# Patient Record
Sex: Male | Born: 1955 | Race: White | Hispanic: No | Marital: Married | Smoking: Former smoker
Health system: Southern US, Community
[De-identification: ages and names within clinical notes are randomized; demographics above are authoritative.]

## PROBLEM LIST (undated history)

## (undated) DIAGNOSIS — G4733 Obstructive sleep apnea (adult) (pediatric): Secondary | ICD-10-CM

## (undated) DIAGNOSIS — I2510 Atherosclerotic heart disease of native coronary artery without angina pectoris: Secondary | ICD-10-CM

## (undated) DIAGNOSIS — D47Z2 Castleman disease: Secondary | ICD-10-CM

## (undated) DIAGNOSIS — R945 Abnormal results of liver function studies: Secondary | ICD-10-CM

## (undated) DIAGNOSIS — R600 Localized edema: Secondary | ICD-10-CM

## (undated) DIAGNOSIS — R7989 Other specified abnormal findings of blood chemistry: Secondary | ICD-10-CM

## (undated) DIAGNOSIS — I2119 ST elevation (STEMI) myocardial infarction involving other coronary artery of inferior wall: Secondary | ICD-10-CM

## (undated) DIAGNOSIS — I10 Essential (primary) hypertension: Secondary | ICD-10-CM

## (undated) HISTORY — DX: ST elevation (STEMI) myocardial infarction involving other coronary artery of inferior wall: I21.19

## (undated) HISTORY — DX: Abnormal results of liver function studies: R94.5

## (undated) HISTORY — PX: BONE CYST EXCISION: SHX376

## (undated) HISTORY — DX: Other specified abnormal findings of blood chemistry: R79.89

## (undated) HISTORY — DX: Obstructive sleep apnea (adult) (pediatric): G47.33

## (undated) HISTORY — DX: Essential (primary) hypertension: I10

## (undated) HISTORY — PX: TUMOR REMOVAL: SHX12

## (undated) HISTORY — DX: Castleman disease: D47.Z2

## (undated) HISTORY — PX: SKIN GRAFT: SHX250

## (undated) HISTORY — DX: Atherosclerotic heart disease of native coronary artery without angina pectoris: I25.10

## (undated) HISTORY — DX: Localized edema: R60.0

---

## 2008-09-03 ENCOUNTER — Encounter: Payer: Self-pay | Admitting: Cardiology

## 2009-01-11 ENCOUNTER — Encounter: Payer: Self-pay | Admitting: Cardiology

## 2009-02-10 ENCOUNTER — Encounter: Payer: Self-pay | Admitting: Cardiology

## 2009-04-02 ENCOUNTER — Ambulatory Visit: Payer: Self-pay | Admitting: Cardiology

## 2009-09-28 ENCOUNTER — Encounter: Payer: Self-pay | Admitting: Physician Assistant

## 2009-09-28 ENCOUNTER — Inpatient Hospital Stay (HOSPITAL_COMMUNITY): Admission: EM | Admit: 2009-09-28 | Discharge: 2009-10-01 | Payer: Self-pay | Admitting: Emergency Medicine

## 2009-09-28 ENCOUNTER — Ambulatory Visit: Payer: Self-pay | Admitting: Cardiovascular Disease

## 2009-09-29 ENCOUNTER — Encounter: Payer: Self-pay | Admitting: Cardiovascular Disease

## 2009-09-30 ENCOUNTER — Encounter: Payer: Self-pay | Admitting: Cardiovascular Disease

## 2009-10-01 ENCOUNTER — Encounter: Payer: Self-pay | Admitting: Cardiology

## 2009-10-01 ENCOUNTER — Encounter: Payer: Self-pay | Admitting: Cardiovascular Disease

## 2009-10-02 ENCOUNTER — Telehealth (INDEPENDENT_AMBULATORY_CARE_PROVIDER_SITE_OTHER): Payer: Self-pay | Admitting: *Deleted

## 2009-10-13 ENCOUNTER — Encounter: Payer: Self-pay | Admitting: Cardiology

## 2009-10-20 ENCOUNTER — Ambulatory Visit: Payer: Self-pay | Admitting: Cardiology

## 2009-10-20 DIAGNOSIS — K7689 Other specified diseases of liver: Secondary | ICD-10-CM

## 2009-10-20 DIAGNOSIS — R599 Enlarged lymph nodes, unspecified: Secondary | ICD-10-CM | POA: Insufficient documentation

## 2009-10-20 DIAGNOSIS — G4733 Obstructive sleep apnea (adult) (pediatric): Secondary | ICD-10-CM

## 2009-10-20 DIAGNOSIS — I2510 Atherosclerotic heart disease of native coronary artery without angina pectoris: Secondary | ICD-10-CM | POA: Insufficient documentation

## 2009-10-20 DIAGNOSIS — I219 Acute myocardial infarction, unspecified: Secondary | ICD-10-CM | POA: Insufficient documentation

## 2009-11-21 ENCOUNTER — Encounter: Payer: Self-pay | Admitting: Cardiology

## 2010-03-05 ENCOUNTER — Encounter: Payer: Self-pay | Admitting: Cardiology

## 2010-03-12 ENCOUNTER — Encounter: Payer: Self-pay | Admitting: Cardiology

## 2010-04-09 ENCOUNTER — Ambulatory Visit: Payer: Self-pay | Admitting: Cardiology

## 2010-04-09 ENCOUNTER — Encounter: Payer: Self-pay | Admitting: Physician Assistant

## 2010-04-09 ENCOUNTER — Encounter (INDEPENDENT_AMBULATORY_CARE_PROVIDER_SITE_OTHER): Payer: Self-pay | Admitting: *Deleted

## 2010-04-10 ENCOUNTER — Ambulatory Visit: Payer: Self-pay | Admitting: Cardiology

## 2010-04-10 ENCOUNTER — Inpatient Hospital Stay (HOSPITAL_BASED_OUTPATIENT_CLINIC_OR_DEPARTMENT_OTHER): Admission: RE | Admit: 2010-04-10 | Discharge: 2010-04-10 | Payer: Self-pay | Admitting: Cardiology

## 2010-04-10 ENCOUNTER — Encounter: Payer: Self-pay | Admitting: Physician Assistant

## 2010-04-14 ENCOUNTER — Telehealth (INDEPENDENT_AMBULATORY_CARE_PROVIDER_SITE_OTHER): Payer: Self-pay | Admitting: *Deleted

## 2010-04-21 ENCOUNTER — Encounter: Payer: Self-pay | Admitting: Physician Assistant

## 2010-04-22 ENCOUNTER — Encounter (INDEPENDENT_AMBULATORY_CARE_PROVIDER_SITE_OTHER): Payer: Self-pay | Admitting: *Deleted

## 2010-05-04 ENCOUNTER — Ambulatory Visit: Payer: Self-pay | Admitting: Cardiology

## 2010-05-04 DIAGNOSIS — E782 Mixed hyperlipidemia: Secondary | ICD-10-CM | POA: Insufficient documentation

## 2010-05-11 ENCOUNTER — Encounter: Payer: Self-pay | Admitting: Physician Assistant

## 2010-06-18 ENCOUNTER — Encounter (INDEPENDENT_AMBULATORY_CARE_PROVIDER_SITE_OTHER): Payer: Self-pay | Admitting: *Deleted

## 2010-06-30 ENCOUNTER — Encounter: Payer: Self-pay | Admitting: Physician Assistant

## 2010-07-02 ENCOUNTER — Encounter (INDEPENDENT_AMBULATORY_CARE_PROVIDER_SITE_OTHER): Payer: Self-pay | Admitting: *Deleted

## 2010-07-21 NOTE — Assessment & Plan Note (Signed)
Summary: 6 mo ful fholt   Visit Type:  Follow-up Primary Provider:  Dr. Sherril Cong   History of Present Illness: 55 year old male presents for follow-up. He saw Dr. Dannielle Burn in May.  Since his last visit, patient reports 3 discrete episodes of chest pain, reminiscent of his MI presentation earlier this year. His most severe episode was in late September, as he was completing his usual exercise routine. His initial symptom was that of nausea, followed by profuse diaphoresis, and then chest pain. His 2 previous episodes consisted of chest pressure only. In all 3 cases, he took one nitroglycerin tablet. During the last episode, however, he briefly lost consciousness, and his wife called EMS. He quickly regained consciousness, and was told that he had bottomed out his blood pressure. He reports that they took readings of 90 systolic, upon standing. He apparently did not feel that he needed subsequent medical attention, at that time.  Since then, he denies any recurrent episodes. He has continued to exercise regularly. He does point out, however, that this most recent episode was his most intense, and most like his initial MI presentation, back in April.  Twelve-lead EKG in office today indicates normal sinus rhythm with no acute changes.    Preventive Screening-Counseling & Management  Alcohol-Tobacco     Smoking Status: quit     Year Quit: 2008  Current Medications (verified): 1)  Fish Oil 1000 Mg Caps (Omega-3 Fatty Acids) .... Take 3 Tablet By Mouth Once A Day 2)  Toprol Xl 25 Mg Xr24h-Tab (Metoprolol Succinate) .... Take 1 Tablet By Mouth Once A Day 3)  Amlodipine Besylate 10 Mg Tabs (Amlodipine Besylate) .... Take 1 Tablet By Mouth Once A Day 4)  Furosemide 40 Mg Tabs (Furosemide) .... Take 1 Tablet By Mouth Once A Day As Needed 5)  Multivitamins  Tabs (Multiple Vitamin) .... Take 1 Tablet By Mouth Once A Day 6)  Nitrostat 0.4 Mg Subl (Nitroglycerin) .... Use As Directed 7)   Aspir-Trin 325 Mg Tbec (Aspirin) .... Take 1 Tablet By Mouth Once A Day 8)  Effient 10 Mg Tabs (Prasugrel Hcl) .... Take 1 Tablet By Mouth Once A Day 9)  Diovan Hct 320-12.5 Mg Tabs (Valsartan-Hydrochlorothiazide) .... Take 1 Tablet By Mouth Once A Day 10)  Vitamin C 500 Mg Tabs (Ascorbic Acid) .... Take 2 Tablet By Mouth Once A Day 11)  Vitamin E 400 Unit Caps (Vitamin E) .... Take 2 Tablet By Mouth Once A Day 12)  Prevacid 15 Mg Cpdr (Lansoprazole) .... As Needed 13)  Metrogel 1 % Gel (Metronidazole) .... Apply Topically Two Times A Day  Allergies (verified): 1)  ! * Niquil 2)  ! * Acetamenophin  Comments:  Nurse/Medical Assistant: The patient's medication bottles and allergies were reviewed with the patient and were updated in the Medication and Allergy Lists.  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 All other systems negative.   Vital Signs:  Patient profile:   55 year old male Height:      69 inches Weight:      189  pounds Pulse rate:   64 / minute BP sitting:   131 / 80  (left arm) Cuff size:   regular  Vitals Entered By: Georgina Peer (April 09, 2010 3:38 PM)  Physical Exam  Additional Exam:  GEN: 55 year old male, sitting upright, in no distress HEENT: NCAT,PERRLA,EOMI NECK: palpable pulses, no bruits; no JVD; no TM LUNGS: CTA bilaterally HEART: RRR (S1S2); no significant murmurs; no rubs; no gallops ABD: soft, NT; intact BS EXT: intact distal pulses; no edema SKIN: warm, dry MUSC: no obvious  deformity NEURO: A/O (x3)     EKG  Procedure date:  04/09/2010  Findings:      normal sinus rhythm at 64 bpm; normal axis; no acute changes  Impression & Recommendations:  Problem # 1:  CAD (ICD-414.00)  Patient returns to clinic for scheduled followup, reporting symptoms of chest discomfort reminiscent of his MI presentation, back in April. His most recent episode, in late September, was his most intense. In all 3 cases, he reported relief after one nitroglycerin tablet. He has not had any recurrent episodes. EKG today does not indicate any acute changes. I discussed with him the option of either a stress test versus relook cardiac catheterization, and we both agreed that a diagnostic coronary angiogram would be more definitive. I conferred with Dr. Dannielle Burn, who also agreed, and we'll arrange for an early procedure in our JV catheterization lab, perhaps as early as tomorrow. The patient has p.r.n. nitroglycerin, and has been advised to proceed directly to the ED, in the event of worsening and unrelieved chest pain. Following completion of the study, we will arrange for early post hospital followup with Dr. Dannielle Burn.  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

## 2010-07-21 NOTE — Letter (Signed)
Summary: Risk analyst at [facility]. [address]   [place], [place] [address]   Phone: [phone]  Fax: [phone]        April 22, 2010 MRN: [id number]   Dwayne Garrett [name], [name]  [address]   Dear [name],  Your test ordered by Rande Lawman has been reviewed by your physician (or physician assistant) and was found to be normal or stable. Your physician (or physician assistant) felt no changes were needed at this time.  ____ Echocardiogram  ____ Cardiac Stress Test  ____ Lab Work  ____ Peripheral vascular study of arms, legs or neck  __X__ CT scan or X-ray (chest x-ray done on 10/20)  ____ Lung or Breathing test  ____ Other:   Thank you.   Lovina Reach, LPN    Bryon Lions, M.D., F.A.C.C. Maceo Pro, M.D., F.A.C.C. Cammy Copa, M.D., F.A.C.C. Vonda Antigua, M.D., F.A.C.C. Vita Barley, M.D., F.A.C.C. Mare Ferrari, M.D., F.A.C.C. Emi Belfast, PA-C

## 2010-07-21 NOTE — Cardiovascular Report (Signed)
Summary: Cardiac Catheterization  Cardiac Catheterization   Imported By: Bartholomew Boards 04/21/2010 15:34:19  _____________________________________________________________________  External Attachment:    Type:   Image     Comment:   External Document

## 2010-07-21 NOTE — Letter (Signed)
Summary: Cardiac Cath Instructions - JV Lab  [facility] [place]. [address]   [place], [place] [address]   Phone: [phone]  Fax: [phone]     04/09/2010 MRN: [id number]  Dwayne Garrett [place], [place]  [address]  Dear Dwayne Garrett,   You are scheduled for a Cardiac Catheterization on Friday, October21 at 12:00 with Dr. Doreatha Lew.  Please arrive to the 1st floor of the Heart and Vascular Center at [facility] at 11:00 am on the day of your procedure. Please do not arrive before 6:30 a.m. Call the Heart and Vascular Center at [phone] if you are unable to make your appointmnet. The Code to get into the parking garage under the building is 0200. Take the elevators to the 1st floor. You must have someone to drive you home. Someone must be with you for the first 24 hours after you arrive home. Please wear clothes that are easy to get on and off and wear slip-on shoes. Do not eat or drink after midnight except water with your medications that morning. Bring all your medications and current insurance cards with you.  ___ DO NOT take these medications before your procedure:  ___ Make sure you take your aspirin.  ___ You may take ALL of your medications with water that morning.  ___ DO NOT take ANY medications before your procedure.  ___ Pre-med instructions:  ________________________________________________________________________  The usual length of stay after your procedure is 2 to 3 hours. This can vary.  If you have any questions, please call the office at the number listed above.  Lovina Reach, LPN    Directions to the [facility]  Please Note : Park in [place] under the building not the parking deck.  From [employer]: Turn onto [address] Left onto [address] (1st stoplight) Right at the brick entrance to the hospital ([address] circle [address]) [place] to the right and you will see a blue sign  "[facility]" Parking garage is a sharp right'to get through the gate out in the code _______. Once you park, take the elevator to the first floor. Please do not arrive before 0630am. The building will be dark before that time.   From [address] onto [address] left into the brick entrance to the hospital ([address] circle [address]) [place] to the right and you will see a blue sign "[facility]" Parking garage is a sharp right, to get thru the gate put in the code ____. Once you park, take the elevator to the first floor. Please do not arrive before 0630am. The building will be dark before that time

## 2010-07-21 NOTE — Letter (Signed)
Summary: Internal Correspondence/ FAXED [place]  Internal Correspondence/ FAXED [facility] ORDER   Imported By: Bartholomew Boards 04/23/2010 16:56:14  _____________________________________________________________________  External Attachment:    Type:   Image     Comment:   External Document

## 2010-07-21 NOTE — Assessment & Plan Note (Signed)
Summary: eph - post cath   Visit Type:  Follow-up Primary Valeriano Bain:  Dr. Sherril Cong   History of Present Illness: 55 year old male presents for followup. He is a patient of Dr. Dannielle Burn seen recently by Mr. Serpe on 20 October with recurrent chest pain, subsequently referred for cardiac catheterization, outlined below.  Coronary angiography was performed  on 04/10/10, by Dr. Bea Laura, revealing patent stents in the RCA and diagonal artery, with nonobstructive residual disease in the left main, proximal left circumflex, proximal LAD, and RCA. LV function was normal (EF 55%), with no focal wall motion abnormalities.   Clinically, patient reports no recurrent chest pain, since his procedure. He denies any complications of the right groin incision site.  Preventive Screening-Counseling & Management  Alcohol-Tobacco     Smoking Status: quit     Year Quit: 2008  Current Medications (verified): 1)  Fish Oil 1000 Mg Caps (Omega-3 Fatty Acids) .... Take 3 Tablet By Mouth Once A Day 2)  Toprol Xl 25 Mg Xr24h-Tab (Metoprolol Succinate) .... Take 1 Tablet By Mouth Once A Day 3)  Amlodipine Besylate 10 Mg Tabs (Amlodipine Besylate) .... Take 1 Tablet By Mouth Once A Day 4)  Furosemide 40 Mg Tabs (Furosemide) .... Take 1 Tablet By Mouth Once A Day As Needed 5)  Multivitamins  Tabs (Multiple Vitamin) .... Take 1 Tablet By Mouth Once A Day 6)  Nitrostat 0.4 Mg Subl (Nitroglycerin) .... Use As Directed 7)  Aspir-Trin 325 Mg Tbec (Aspirin) .... Take 1 Tablet By Mouth Once A Day 8)  Effient 10 Mg Tabs (Prasugrel Hcl) .... Take 1 Tablet By Mouth Once A Day 9)  Diovan Hct 320-12.5 Mg Tabs (Valsartan-Hydrochlorothiazide) .... Take 1 Tablet By Mouth Once A Day 10)  Vitamin C 1000 Mg Tabs (Ascorbic Acid) .... Take 1 Tablet By Mouth Once A Day 11)  Vitamin E 400 Unit Caps (Vitamin E) .... Take 2 Tablet By Mouth Once A Day 12)  Prevacid 15 Mg Cpdr (Lansoprazole) .... As Needed 13)  Metrogel 1 % Gel  (Metronidazole) .... Apply Topically Two Times A Day  Allergies (verified): 1)  ! * Niquil 2)  ! * Acetamenophin  Comments:  Nurse/Medical Assistant: The patient's medication bottles and allergies were reviewed with the patient and were updated in the Medication and Allergy Lists.  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 All other systems negative.   Vital Signs:  Patient profile:   55 year old male Height:      69 inches Weight:      192 pounds Pulse rate:   61 / minute BP sitting:   119 / 73  (left arm) Cuff size:   regular  Vitals Entered By: Georgina Peer (May 04, 2010 11:34 AM)  Physical Exam  Additional Exam:  GEN: 55 year old male, sitting upright, in no distress HEENT: NCAT,PERRLA,EOMI NECK: palpable pulses, no bruits; no JVD; no TM LUNGS: CTA bilaterally HEART: RRR (S1S2); no significant murmurs; no rubs; no gallops ABD: soft, NT; intact BS EXT: intact distal pulses; stable right groin with intact pulse, no ecchymosis  or bruit SKIN: warm, dry MUSC: no obvious deformity NEURO: A/O (x3)     Cardiac Cath  Procedure date:  04/11/2010  Findings:       CORONARY ARTERIES:   1. Left main coronary artery had a 20-30% tubular narrowing in the       midportion extending to the distal portions.   2. Left anterior descending.  The left anterior descending had a 50-       60% proximal stenosis.  At the level of the large diagonal vessel,       there is approximately 50% stenosis prior to the diagonal and 50-        60% stenosis focally right after the large diagonal.  There was a       stent in the large diagonal vessel that was patent.  There are       irregularities in this diagonal as well.  The left anterior       descending itself crossed the apex and had irregularities.   3. The left circumflex had a 30-40% proximal narrowing.  It continues       to posterior lateral branch with minimal irregularities,       essentially was normal.   4. The right coronary artery is a large dominant vessel with multiple       scattered irregularities.  There was a large stent in the proximal       portion that was patent without any in-stent narrowing.  There were       5 branches to the inferior wall off the right coronary artery.       There were scattered multiple irregularities, but no focal       narrowing.  Impression & Recommendations:  Problem # 1:  CAD (ICD-414.00)  stable anatomy by recent coronary angiography, indicating patent RCA and diagonal stent sites, with otherwise nonobstructive residual disease, and normal LV function. Continue medical therapy, albeit with reduced aspirin dose of 81 mg daily, to be taken indefinitely.  Problem # 2:  MIXED HYPERLIPIDEMIA (ICD-272.2)  we'll reassess lipid status with a FLP/LFT profile. Patient is followed by Dr. Laural Golden, for history of elevated LFTs and biopsy-proven NASH. A recent LFT profile in September was notable for total bilirubin 1.7 and indirect bilirubin 1.6. Dr. Laural Golden cleared the patient to be placed on a low dose statin, but with close monitoring of his LFTs.  Problem # 3:  CASTLEMAN'S DISEASE (XIH-038.6) Assessment: Comment Only  Problem # 4:  FATTY LIVER DISEASE (ICD-571.8) Assessment: Comment Only  followed by Dr. Laural Golden  Other Orders: T-Hepatic Function [phone]) T-Lipid Profile [phone])  Patient Instructions: 1)  Your physician wants you to follow-up in: 6 months. You will receive a reminder letter in the mail one-two  months in advance. If you don't receive a letter, please call our office to schedule the follow-up appointment. 2)  Decrease Aspirin to 163m (1/2 of your 3219mtablets) and then 8144mnce daily. 3)  [facility] and liver function labs:  Do not eat or drink after midnight.

## 2010-07-21 NOTE — Letter (Signed)
Summary: External Correspondence/ OFFICE VISIT [facility]. [facility]  External Correspondence/ OFFICE VISIT DR. Laural Golden   Imported By: Bartholomew Boards 03/09/2010 14:46:20  _____________________________________________________________________  External Attachment:    Type:   Image     Comment:   External Document

## 2010-07-21 NOTE — Assessment & Plan Note (Signed)
Summary: EPH-POST CATH FROM [facility] vs   Visit Type:  Follow-up Primary Provider:  Dr. Sherril Cong   History of Present Illness: 55 year old male presents to the office for the first time after a recent hospitalization at [facility]. the patient is status post recent myocardial infarction. He required 2 drug-eluting stents to the right coronary artery. He has some residual coronary artery disease but his ejection fraction is 60-65%. The patient has not been placed on staff and drug therapy due to the significant history of fatty liver disease. He has been started on prasugel in conjunction with aspirin. He's tolerating this well without any bleeding complications.  The patient denies intercurrent chest pain short of breath orthopnea or PND. He reports no complications related to his recent catheterization.  The patient fortunately was given an error in his prescription and did not receive any refills on his thyopyridone.   Preventive Screening-Counseling & Management  Alcohol-Tobacco     Smoking Status: quit      Drug Use:  no.    Current Medications (verified): 1)  Fish Oil 1000 Mg Caps (Omega-3 Fatty Acids) .... Take 3 Tablet By Mouth Once A Day 2)  Toprol Xl 25 Mg Xr24h-Tab (Metoprolol Succinate) .... Take 1 Tablet By Mouth Once A Day 3)  Amlodipine Besylate 10 Mg Tabs (Amlodipine Besylate) .... Take 1 Tablet By Mouth Once A Day 4)  Furosemide 40 Mg Tabs (Furosemide) .... Take 1 Tablet By Mouth Once A Day As Needed 5)  Multivitamins  Tabs (Multiple Vitamin) .... Take 1 Tablet By Mouth Once A Day 6)  Nitrostat 0.4 Mg Subl (Nitroglycerin) .... Use As Directed 7)  Aspir-Trin 325 Mg Tbec (Aspirin) .... Take 1 Tablet By Mouth Once A Day 8)  Effient 10 Mg Tabs (Prasugrel Hcl) .... Take 1 Tablet By Mouth Once A Day 9)  Diovan Hct 320-12.5 Mg Tabs (Valsartan-Hydrochlorothiazide) .... Take 1 Tablet By Mouth Once A Day 10)  Vitamin C 500 Mg Tabs (Ascorbic Acid) .... Take 2 Tablet By Mouth  Once A Day 11)  Vitamin E 400 Unit Caps (Vitamin E) .... Take 2 Tablet By Mouth Once A Day 12)  Prevacid 15 Mg Cpdr (Lansoprazole) .... As Needed  Allergies (verified): 1)  ! * Niquil 2)  ! * Acetamenophin  Comments:  Nurse/Medical Assistant: The patient's medications and allergies were reviewed with the patient and were updated in the Medication and Allergy Lists. Bottles reviewed.  Clinical Review Panels:  Cardiac Imaging Cardiac Cath Findings ANGIOGRAPHIC FINDINGS:   1. The left main coronary artery had a long diffuse 50% stenosis in       the midportion of the vessel.   2. The circumflex artery gave off a moderate-sized obtuse marginal       branch.  There appeared to be a 60% stenosis in the proximal       portion of the circumflex artery.   3. Left anterior descending was a large vessel that coursed to the       apex and gave off a large caliber diagonal branch.  The LAD had a       50% stenosis in the proximal portion of the vessel.  There was a       40% stenosis in the midportion of the vessel.  There was distal       plaque disease.  The diagonal was a large-caliber vessel that had       an 80% tubular stenosis in the proximal  portion of the vessel.   4. The right coronary artery had 100% total occlusion in the proximal       portion.   5. Left ventricular angiogram was performed in the RAO projection that       showed subtle hypokinesis of the inferior wall with ejection       fraction of 65%. (09/29/2009)    Past History:  Family History: Last updated: 11/07/09 Siblings: brother died age 36 with MI  Social History: Last updated: 2009/11/07 Full Time - machinist Married  Tobacco Use - Former Alcohol Use - no Drug Use - no  Past Medical History: CAD - multivessel (moderate), DES RCA 4/11, LVEF 60-65% Hypertension Myocardial Infarction - IMI 4/11 OSA on CPAP Abnormal LFT's Castleman's disease  Past Surgical History: Cyst removal from neck Skin  graft to finger Abdominal tumor removal  Family History: Siblings: brother died age 13 with MI  Social History: Full Time - Furniture conservator/restorer Married  Tobacco Use - Former Alcohol Use - no Drug Use - no Smoking Status:  quit Drug Use:  no  Review of Systems  The patient denies fatigue, malaise, fever, weight gain/loss, vision loss, decreased hearing, hoarseness, chest pain, palpitations, shortness of breath, prolonged cough, wheezing, sleep apnea, coughing up blood, abdominal pain, blood in stool, nausea, vomiting, diarrhea, heartburn, incontinence, blood in urine, muscle weakness, joint pain, leg swelling, rash, skin lesions, headache, fainting, dizziness, depression, anxiety, enlarged lymph nodes, easy bruising or bleeding, and environmental allergies.    Vital Signs:  Patient profile:   55 year old male Height:      69 inches Weight:      215 pounds BMI:     31.86 Pulse rate:   60 / minute BP sitting:   121 / 81  (left arm) Cuff size:   large  Vitals Entered By: Georgina Peer Nov 07, 2009 10:05 AM)  Nutrition Counseling: Patient's BMI is greater than 25 and therefore counseled on weight management options.  Physical Exam  Additional Exam:  General: Well-developed, well-nourished in no distress head: Normocephalic and atraumatic eyes PERRLA/EOMI intact, conjunctiva and lids normal nose: No deformity or lesions mouth normal dentition, normal posterior pharynx neck: Supple, no JVD.  No masses, thyromegaly or abnormal cervical nodes lungs: Normal breath sounds bilaterally without wheezing.  Normal percussion heart: regular rate and rhythm with normal S1 and S2, no S3 or S4.  PMI is normal.  No pathological murmurs abdomen: Normal bowel sounds, abdomen is soft and nontender without masses, organomegaly or hernias noted.  No hepatosplenomegaly musculoskeletal: Back normal, normal gait muscle strength and tone normal pulsus: Pulse is normal in all 4 extremities Extremities: No  peripheral pitting edema neurologic: Alert and oriented x 3 skin: Intact without lesions or rashes cervical nodes: No significant adenopathy psychologic: Normal affect    Cardiac Cath  Procedure date:  09/29/2009  Findings:      ANGIOGRAPHIC FINDINGS:   1. The left main coronary artery had a long diffuse 50% stenosis in       the midportion of the vessel.   2. The circumflex artery gave off a moderate-sized obtuse marginal       branch.  There appeared to be a 60% stenosis in the proximal       portion of the circumflex artery.   3. Left anterior descending was a large vessel that coursed to the       apex and gave off a large caliber diagonal branch.  The LAD  had a       50% stenosis in the proximal portion of the vessel.  There was a       40% stenosis in the midportion of the vessel.  There was distal       plaque disease.  The diagonal was a large-caliber vessel that had       an 80% tubular stenosis in the proximal portion of the vessel.   4. The right coronary artery had 100% total occlusion in the proximal       portion.   5. Left ventricular angiogram was performed in the RAO projection that       showed subtle hypokinesis of the inferior wall with ejection       fraction of 65%.  Echocardiogram  Procedure date:  09/29/2009  Findings:       Study Conclusions    - Left ventricle: The cavity size was normal. Wall thickness was     increased in a pattern of mild LVH. Systolic function was normal.     The estimated ejection fraction was in the range of 60% to 65%.   - Mitral valve: Mild regurgitation.  Impression & Recommendations:  Problem # 1:  CAD (ICD-414.00) the patient has residual coronary disease but no recurrent chest pain. Continue risk factor modification His updated medication list for this problem includes:    Toprol Xl 25 Mg Xr24h-tab (Metoprolol succinate) .Marland Kitchen... Take 1 tablet by mouth once a day    Amlodipine Besylate 10 Mg Tabs (Amlodipine  besylate) .Marland Kitchen... Take 1 tablet by mouth once a day    Nitrostat 0.4 Mg Subl (Nitroglycerin) ..... Use as directed    Aspir-trin 325 Mg Tbec (Aspirin) .Marland Kitchen... Take 1 tablet by mouth once a day    Effient 10 Mg Tabs (Prasugrel hcl) .Marland Kitchen... Take 1 tablet by mouth once a day  Problem # 2:  MYOCARDIAL INFARCTION (ICD-410.90) the patient has recovered nicely from his myocardial infarction. He has preserved ejection fraction. The patient can continue with risk factor modification. He should be able to go back to work on 5-9 2011. The patient was hospitalized for his myocardial infarction from 4-10 2011 to 4-13 2011. His updated medication list for this problem includes:    Toprol Xl 25 Mg Xr24h-tab (Metoprolol succinate) .Marland Kitchen... Take 1 tablet by mouth once a day    Amlodipine Besylate 10 Mg Tabs (Amlodipine besylate) .Marland Kitchen... Take 1 tablet by mouth once a day    Nitrostat 0.4 Mg Subl (Nitroglycerin) ..... Use as directed    Aspir-trin 325 Mg Tbec (Aspirin) .Marland Kitchen... Take 1 tablet by mouth once a day    Effient 10 Mg Tabs (Prasugrel hcl) .Marland Kitchen... Take 1 tablet by mouth once a day  Problem # 3:  FATTY LIVER DISEASE (ICD-571.8) unable to start the patient on statin drug to his significant fatty liver disease. This was already discussed during his prior hospitalization.  Problem # 4:  SLEEP APNEA, OBSTRUCTIVE (ICD-327.23) Assessment: Comment Only  Problem # 5:  CASTLEMAN'S DISEASE (WEX-937.6) Assessment: Comment Only  Patient Instructions: 1)  Your physician recommends that you continue on your current medications as directed. Please refer to the Current Medication list given to you today. 2)  Follow up in  6 months  Prescriptions: EFFIENT 10 MG TABS (PRASUGREL HCL) Take 1 tablet by mouth once a day  #30 x 11   Entered by:   Lovina Reach, LPN   Authorized by:   Beckie Salts, MD, [facility]  Signed by:   Lovina Reach, LPN on 74/45/1460   Method used:   Electronically to        [facility]  (retail)       [address], [place]  [address]       Ph: [phone]       Fax: [phone]   RxID:   [id number] EFFIENT 10 MG TABS (PRASUGREL HCL) Take 1 tablet by mouth once a day  #0 x 11   Entered by:   Lovina Reach, LPN   Authorized by:   Beckie Salts, MD, [facility]   Signed by:   Lovina Reach, LPN on 22/24/1146   Method used:   Electronically to        [facility] (retail)       [address]       [place], [place]  [address]       Ph: [phone]       Fax: [phone]   RxID:   [id number]

## 2010-07-21 NOTE — Progress Notes (Signed)
Summary: indigestion  Phone Note Call from Patient   Summary of Call: c/o acid reflux / indigestion.  Wants to know if okay to take Prevacid along with his other meds.  States that he was on this prior to going in hospital, but was not on list at d/c.  States he has had issues with this before, did drink coffee this morning which usually aggravates his symptoms.  Recently has had stents also.  Pt. states that he really thinks that it is his reflux flaring up.  Advised him that the Prevacid would probably be okay with his other meds, but the MD would have to be the one to decide since not on current med list and we have not seen him yet in this office for post hosp.  Advised him that if he did take the Prevacid and his indigestion or discomfort was not relieved to go to ED for evaluation in light of his recent stents.  He also states he does have visit with PMD next week and [place] around first couple weeks of May.  Patient verbalized understanding.  Initial call taken by: Lovina Reach, LPN,  October 02, 221 5:13 PM  Follow-up for Phone Call        An antacid should be OK in general - he should review with his primary care physician.  Has not been seen yet in our office. Follow-up by: Beckie Salts, MD, [facility],  October 06, 2009 11:16 AM

## 2010-07-21 NOTE — Letter (Signed)
Summary: Internal Other/ PATIENT HISTORY FORM  Internal Other/ PATIENT HISTORY FORM   Imported By: Bartholomew Boards 10/13/2009 09:30:21  _____________________________________________________________________  External Attachment:    Type:   Image     Comment:   External Document

## 2010-07-21 NOTE — Progress Notes (Signed)
Summary: Insurance requesting cath report  Phone Note Other Incoming Call back at [phone], option 3   Summary of Call: Melissa with [facility] called requesting cath report be faxed to her at [phone]. She states she is w/pt's insurance company. She states when authorization is requested for procedure/test, they generally get the results to "finish the piece."    Initial call taken by: Gurney Maxin, RN, BSN,  April 14, 2010 2:29 PM

## 2010-07-21 NOTE — Cardiovascular Report (Signed)
Summary: Cardiac Catheterization  Cardiac Catheterization   Imported By: Bartholomew Boards 04/21/2010 15:35:39  _____________________________________________________________________  External Attachment:    Type:   Image     Comment:   External Document

## 2010-07-23 NOTE — Letter (Signed)
Summary: Risk analyst at [facility]. [address]   [place], [place] [address]   Phone: [phone]  Fax: [phone]        July 02, 2010 MRN: [id number]   ABHI MOCCIA [place], [place]  [address]   Dear Mr. Jarnagin,  Your test ordered by Rande Lawman has been reviewed by your physician (or physician assistant) and was found to be normal or stable. Your physician (or physician assistant) felt no changes were needed at this time.  ____ Echocardiogram  ____ Cardiac Stress Test  __X__ Lab Work - repeat labs again in 6 months, will send reminder in the mail  ____ Peripheral vascular study of arms, legs or neck  ____ CT scan or X-ray  ____ Lung or Breathing test  ____ Other:   Thank you.   Lovina Reach, LPN    Bryon Lions, M.D., F.A.C.C. Maceo Pro, M.D., F.A.C.C. Cammy Copa, M.D., F.A.C.C. Vonda Antigua, M.D., F.A.C.C. Vita Barley, M.D., F.A.C.C. Mare Ferrari, M.D., F.A.C.C. Emi Belfast, PA-C

## 2010-07-23 NOTE — Letter (Signed)
Summary: Reminder for labs  [facility]. [address] [place], [place] [address]   Phone: [phone]  Fax: [phone]        June 18, 2010 MRN: [id number]    [address] [place], [place]  [address]    Dear [name],   According to our records, you are due for lab work to check your cholesterol and liver function.   Please, take the enclosed order to the [facility] at your earliest convenience to have this done.   Do not eat or drink after midnight.        Sincerely,  Gurney Maxin, RN, BSN  This letter has been electronically signed by your physician.

## 2010-09-09 LAB — CBC
HCT: 34.6 % — ABNORMAL LOW (ref 39.0–52.0)
HCT: 35 % — ABNORMAL LOW (ref 39.0–52.0)
MCHC: 33.9 g/dL (ref 30.0–36.0)
MCV: 91.7 fL (ref 78.0–100.0)
MCV: 92.1 fL (ref 78.0–100.0)
Platelets: 198 10*3/uL (ref 150–400)
Platelets: 206 10*3/uL (ref 150–400)
Platelets: 239 10*3/uL (ref 150–400)
RBC: 3.73 MIL/uL — ABNORMAL LOW (ref 4.22–5.81)
RBC: 3.8 MIL/uL — ABNORMAL LOW (ref 4.22–5.81)
RBC: 3.8 MIL/uL — ABNORMAL LOW (ref 4.22–5.81)
RDW: 13.3 % (ref 11.5–15.5)
WBC: 10.7 10*3/uL — ABNORMAL HIGH (ref 4.0–10.5)
WBC: 7.8 10*3/uL (ref 4.0–10.5)
WBC: 8.2 10*3/uL (ref 4.0–10.5)

## 2010-09-09 LAB — BASIC METABOLIC PANEL
BUN: 13 mg/dL (ref 6–23)
BUN: 13 mg/dL (ref 6–23)
Chloride: 108 mEq/L (ref 96–112)
GFR calc Af Amer: >60 mL/min (ref >60)
GFR calc Af Amer: >60 mL/min (ref >60)
GFR calc non Af Amer: >60 mL/min (ref >60)
Potassium: 3.8 mEq/L (ref 3.5–5.1)
Potassium: 3.9 mEq/L (ref 3.5–5.1)
Sodium: 141 mEq/L (ref 135–145)

## 2010-09-09 LAB — CK TOTAL AND CKMB (NOT AT ARMC)
CK, MB: 25.8 ng/mL (ref 0.3–4.0)
CK, MB: 28 ng/mL (ref 0.3–4.0)
CK, MB: 42.6 ng/mL (ref 0.3–4.0)
Relative Index: 4.7 — ABNORMAL HIGH (ref 0.0–2.5)
Relative Index: 5.5 — ABNORMAL HIGH (ref 0.0–2.5)
Total CK: 602 U/L — ABNORMAL HIGH (ref 7–232)
Total CK: 781 U/L — ABNORMAL HIGH (ref 7–232)

## 2010-09-09 LAB — COMPREHENSIVE METABOLIC PANEL
ALT: 53 U/L (ref 0–53)
AST: 75 U/L — ABNORMAL HIGH (ref 0–37)
AST: 76 U/L — ABNORMAL HIGH (ref 0–37)
Albumin: 3.2 g/dL — ABNORMAL LOW (ref 3.5–5.2)
Albumin: 3.6 g/dL (ref 3.5–5.2)
Alkaline Phosphatase: 42 U/L (ref 39–117)
CO2: 24 mEq/L (ref 19–32)
Chloride: 109 mEq/L (ref 96–112)
Chloride: 109 mEq/L (ref 96–112)
Creatinine, Ser: 0.85 mg/dL (ref 0.4–1.5)
GFR calc Af Amer: >60 mL/min (ref >60)
Potassium: 3.6 mEq/L (ref 3.5–5.1)
Sodium: 139 mEq/L (ref 135–145)
Total Bilirubin: 1.4 mg/dL — ABNORMAL HIGH (ref 0.3–1.2)
Total Bilirubin: 1.4 mg/dL — ABNORMAL HIGH (ref 0.3–1.2)

## 2010-09-09 LAB — TSH: TSH: 0.814 u[IU]/mL (ref 0.350–4.500)

## 2010-09-09 LAB — TROPONIN I
Troponin I: 3.14 ng/mL (ref 0.00–0.06)
Troponin I: 9.99 ng/mL (ref 0.00–0.06)

## 2010-09-09 LAB — LIPID PANEL
Triglycerides: 48 mg/dL (ref <150)
VLDL: 10 mg/dL (ref 0–40)

## 2010-09-09 LAB — APTT: aPTT: 31 seconds (ref 24–37)

## 2010-10-21 ENCOUNTER — Other Ambulatory Visit: Payer: Self-pay | Admitting: Cardiovascular Disease

## 2010-10-22 ENCOUNTER — Other Ambulatory Visit: Payer: Self-pay | Admitting: *Deleted

## 2010-10-22 MED ORDER — PRASUGREL HCL 10 MG PO TABS: 10.0000 mg | ORAL_TABLET | Freq: Every day | ORAL | Status: DC

## 2010-12-01 ENCOUNTER — Encounter: Payer: Self-pay | Admitting: Cardiology

## 2010-12-11 ENCOUNTER — Ambulatory Visit: Payer: Self-pay | Admitting: Cardiology

## 2010-12-16 ENCOUNTER — Encounter: Payer: Self-pay | Admitting: *Deleted

## 2010-12-16 ENCOUNTER — Encounter: Payer: Self-pay | Admitting: Adult Health

## 2010-12-16 ENCOUNTER — Ambulatory Visit (INDEPENDENT_AMBULATORY_CARE_PROVIDER_SITE_OTHER): Payer: PRIVATE HEALTH INSURANCE | Admitting: Adult Health

## 2010-12-16 DIAGNOSIS — E782 Mixed hyperlipidemia: Secondary | ICD-10-CM

## 2010-12-16 DIAGNOSIS — N529 Male erectile dysfunction, unspecified: Secondary | ICD-10-CM | POA: Insufficient documentation

## 2010-12-16 DIAGNOSIS — I2510 Atherosclerotic heart disease of native coronary artery without angina pectoris: Secondary | ICD-10-CM

## 2010-12-16 DIAGNOSIS — E7800 Pure hypercholesterolemia, unspecified: Secondary | ICD-10-CM

## 2010-12-16 DIAGNOSIS — I10 Essential (primary) hypertension: Secondary | ICD-10-CM

## 2010-12-16 NOTE — Assessment & Plan Note (Signed)
I will order lipids and LFTs for ongoing assessment.

## 2010-12-16 NOTE — Assessment & Plan Note (Signed)
I have given him samples of Cialis to try. This may be related to the BB that his taking for heart disease. I have advised him not to use NTG when he takes this medication.  If this is helpful to him, I have advised him to follow-up with PCP for Rx for same.

## 2010-12-16 NOTE — Progress Notes (Signed)
HPI: Mr. Franzoni is a talkative, friendly CM patient of Dr. Domenic Polite in the [place] office we are following for ongoing assessment and treatment of CAD, s/p DES of RCA on 4/11, LVEF of 60%, with follow-up cardiac catheterization secondary to recurrent chest pain,in Oct of 2011 demonstrating patent stents in the RCA and diagonal artery, with nonobstructive disease elsewhere.Marland Kitchen He is also being treated for hypercholesterolemia, hypertension.  He is without complaints today with the exception of mild ED.  He runs 6 miles every other day, lifts weights, walks daily and does a 5K every other weekend.  He denies complaints of weakness, fatigue, DOE or chest pain elicited by exercise.   No Known Allergies  Current Outpatient Prescriptions  Medication Sig Dispense Refill  . amLODipine (NORVASC) 10 MG tablet Take 10 mg by mouth daily. Patient taking 1 tab every other day      . Ascorbic Acid (VITAMIN C) 1000 MG tablet Take 1,000 mg by mouth daily.        Marland Kitchen aspirin 81 MG tablet Take 81 mg by mouth daily.        . furosemide (LASIX) 40 MG tablet Take 40 mg by mouth daily as needed.        . lansoprazole (PREVACID) 15 MG capsule Take 15 mg by mouth as needed.        . metoprolol succinate (TOPROL-XL) 25 MG 24 hr tablet TAKE 1 TABLET BY MOUTH EVERY DAY  90 tablet  0  . metroNIDAZOLE (METROGEL) 1 % gel Apply 1 application topically 2 (two) times daily.        . Multiple Vitamin (MULTIVITAMIN) tablet Take 1 tablet by mouth daily.        . nitroGLYCERIN (NITROSTAT) 0.4 MG SL tablet Place 0.4 mg under the tongue every 5 (five) minutes as needed.        . Omega-3 Fatty Acids (FISH OIL) 1000 MG CAPS Take 3 capsules by mouth daily.        . prasugrel (EFFIENT) 10 MG TABS Take 1 tablet (10 mg total) by mouth daily.  30 tablet  6  . pravastatin (PRAVACHOL) 40 MG tablet Take 40 mg by mouth daily.        . valsartan-hydrochlorothiazide (DIOVAN-HCT) 320-12.5 MG per tablet Take 1 tablet by mouth daily.        . vitamin E 400  UNIT capsule Take 800 Units by mouth daily.          Past Medical History  Diagnosis Date  . CAD (coronary artery disease)     multivessel (moderate), DES RCA 4/11, LVEF 60-65%  . Hypertension   . Myocardial infarction     IMI- 4/11  . OSA (obstructive sleep apnea)     on CPAP  . Abnormal LFTs   . Castleman's disease     Past Surgical History  Procedure Date  . Skin graft     to finger  . Tumor removal     abdominal  . Bone cyst excision     cyst removal from neck    YBW:LSLHTD of systems complete and found to be negative unless listed above PHYSICAL EXAM BP 123/76  Pulse 63  Ht 5' 9"  (1.753 m)  Wt 192 lb (87.091 kg)  BMI 28.35 kg/m2  SpO2 98% General: Well developed, well nourished, in no acute distress Head: Eyes PERRLA, No xanthomas.   Normal cephalic and atramatic  Lungs: Clear bilaterally to auscultation and percussion. Heart: HRRR S1 S2, soft 1/6 systolic murmur.Marland Kitchen  Pulses are 2+ & equal.            No carotid bruit. No JVD.  No abdominal bruits. No femoral bruits. Abdomen: Bowel sounds are positive, abdomen soft and non-tender without masses or                  Hernia's noted. Msk:  Back normal, normal gait. Normal strength and tone for age. Extremities: No clubbing, cyanosis or edema.  DP +1 Neuro: Alert and oriented X 3. Psych:  Good affect, responds appropriately  EKG:NSR with anterior T-wave abnormality.  Rate of 65 bpm.  ASSESSMENT AND PLAN

## 2010-12-16 NOTE — Assessment & Plan Note (Addendum)
He is very stable from a cardiac standpoint. He is very energetic and runs several times a week.  He is asymptomatic during this. He has lost 40 lbs and feels good. No changes in his medications. Will need to have discussion of when to take him off of prasugrel dosing as he has been on this since stent placement in April of 2011

## 2010-12-16 NOTE — Patient Instructions (Signed)
Your physician recommends that you schedule a follow-up appointment in: 6 months cialis samples-if effective ask for refill from PCP   cialis- take 1 tablet 30 minutes prior to sexual activity, if you have an erection more than 4 hrs go to the ED, it can be a life threatening emergency

## 2010-12-18 ENCOUNTER — Encounter: Payer: Self-pay | Admitting: Adult Health

## 2010-12-21 ENCOUNTER — Other Ambulatory Visit: Payer: Self-pay | Admitting: Adult Health

## 2010-12-22 ENCOUNTER — Encounter: Payer: Self-pay | Admitting: *Deleted

## 2010-12-22 LAB — HEPATIC FUNCTION PANEL
AST: 28 U/L (ref 0–37)
Albumin: 4.4 g/dL (ref 3.5–5.2)
Alkaline Phosphatase: 51 U/L (ref 39–117)
Total Protein: 6.8 g/dL (ref 6.0–8.3)

## 2010-12-22 LAB — LIPID PANEL
Cholesterol: 187 mg/dL (ref 0–200)
HDL: 54 mg/dL (ref >39)
Total CHOL/HDL Ratio: 3.5 Ratio
Triglycerides: 73 mg/dL (ref <150)
VLDL: 15 mg/dL (ref 0–40)

## 2011-04-15 DIAGNOSIS — R079 Chest pain, unspecified: Secondary | ICD-10-CM

## 2011-04-16 ENCOUNTER — Other Ambulatory Visit: Payer: Self-pay | Admitting: Adult Health

## 2011-04-16 DIAGNOSIS — I2510 Atherosclerotic heart disease of native coronary artery without angina pectoris: Secondary | ICD-10-CM

## 2011-04-16 DIAGNOSIS — E7800 Pure hypercholesterolemia, unspecified: Secondary | ICD-10-CM

## 2011-04-16 DIAGNOSIS — I10 Essential (primary) hypertension: Secondary | ICD-10-CM

## 2011-04-20 ENCOUNTER — Telehealth: Payer: Self-pay | Admitting: Nurse Practitioner

## 2011-04-20 ENCOUNTER — Ambulatory Visit (INDEPENDENT_AMBULATORY_CARE_PROVIDER_SITE_OTHER): Payer: PRIVATE HEALTH INSURANCE | Admitting: Cardiology

## 2011-04-20 ENCOUNTER — Encounter: Payer: Self-pay | Admitting: Cardiology

## 2011-04-20 VITALS — BP 133/78 | HR 54 | Resp 16 | Ht 69.0 in | Wt 201.0 lb

## 2011-04-20 DIAGNOSIS — R0602 Shortness of breath: Secondary | ICD-10-CM

## 2011-04-20 DIAGNOSIS — I2510 Atherosclerotic heart disease of native coronary artery without angina pectoris: Secondary | ICD-10-CM

## 2011-04-20 DIAGNOSIS — R7989 Other specified abnormal findings of blood chemistry: Secondary | ICD-10-CM

## 2011-04-20 DIAGNOSIS — R600 Localized edema: Secondary | ICD-10-CM | POA: Insufficient documentation

## 2011-04-20 DIAGNOSIS — R609 Edema, unspecified: Secondary | ICD-10-CM

## 2011-04-20 NOTE — Patient Instructions (Addendum)
Your physician wants you to follow-up in: 6 months. You will receive a reminder letter in the mail one-two months in advance. If you don't receive a letter, please call our office to schedule the follow-up appointment. Your physician recommends that you continue on your current medications as directed. Please refer to the Current Medication list given to you today. Your physician recommends that you go to the [facility] for lab work: BMET/D-DIMER If the results of your test are normal or stable, you will receive a letter. If they are abnormal, the nurse will contact you by phone.

## 2011-04-20 NOTE — Progress Notes (Signed)
HPI  The patient is a 55 year old male with a history of coronary disease status post drug-eluting stent x2 to the RCA April 2011. Ejection fraction 60%. Followup catheterization October 2011 demonstrated patent stents in RCA and diagonal artery. The patient had a recent stress test on 04/15/2011. This was a difficult study but overall appears to be normal. There was significant artifact and there was an inferior wall defect likely secondary to diaphragmatic attenuation. No definite ischemia was seen however. Clinically however the patient complains over the last weeks to several weeks of increased shortness of breath. Previously had very good exercise tolerance and used to run 6 miles every other day with weights and do a 5K on the weekend. He has slowed down quite a bit. He has reported now several episodes of shortness of breath it appears to mainly occur at rest the last 10-15 minutes. He had a particularly bad episode last Sunday as well as today. He also has noticed some swelling in the lower extremities which has been going on for approximately one month. He denies however any chest pain. A bedside echocardiogram today shows normal LV systolic function but I could not make an assessment of diastolic function. The RV appeared to be mildly dilated and there was very mild tricuspid regurgitation. Of note is that the patient has a history of obstructive sleep apnea but is not using CPAP anymore after he lost a significant amount of weight.    No Known Allergies  Current Outpatient Prescriptions on File Prior to Visit  Medication Sig Dispense Refill  . amLODipine (NORVASC) 10 MG tablet Take 10 mg by mouth daily.       . Ascorbic Acid (VITAMIN C) 1000 MG tablet Take 1,000 mg by mouth daily.        Marland Kitchen aspirin 81 MG tablet Take 81 mg by mouth daily.        . furosemide (LASIX) 40 MG tablet Take 40 mg by mouth daily as needed.        . lansoprazole (PREVACID) 15 MG capsule Take 30 mg by mouth as  needed.       . metoprolol succinate (TOPROL-XL) 25 MG 24 hr tablet TAKE 1 TABLET BY MOUTH EVERY DAY  90 tablet  0  . Multiple Vitamin (MULTIVITAMIN) tablet Take 1 tablet by mouth daily.        . nitroGLYCERIN (NITROSTAT) 0.4 MG SL tablet Place 0.4 mg under the tongue every 5 (five) minutes as needed.        . Omega-3 Fatty Acids (FISH OIL) 1000 MG CAPS Take 3 capsules by mouth daily.        . prasugrel (EFFIENT) 10 MG TABS Take 1 tablet (10 mg total) by mouth daily.  30 tablet  6  . pravastatin (PRAVACHOL) 40 MG tablet Take 40 mg by mouth daily.        . valsartan-hydrochlorothiazide (DIOVAN-HCT) 320-12.5 MG per tablet Take 1 tablet by mouth daily.        . vitamin E 400 UNIT capsule Take 800 Units by mouth daily.          Past Medical History  Diagnosis Date  . CAD (coronary artery disease)     multivessel (moderate), DES RCA 4/11, LVEF 60-65%  . Hypertension   . Myocardial infarction     IMI- 4/11  . OSA (obstructive sleep apnea)     on CPAP  . Abnormal LFTs   . Castleman's disease     Past Surgical History  Procedure Date  . Skin graft     to finger  . Tumor removal     abdominal  . Bone cyst excision     cyst removal from neck    Family History  Problem Relation Age of Onset  . Heart attack Brother 61    History   Social History  . Marital Status: Married    Spouse Name: N/A    Number of Children: N/A  . Years of Education: N/A   Occupational History  . machinist    Social History Main Topics  . Smoking status: Former Research scientist (life sciences)  . Smokeless tobacco: Never Used  . Alcohol Use: No  . Drug Use: No  . Sexually Active: Not on file   Other Topics Concern  . Not on file   Social History Narrative  . No narrative on file   Review of systems: No nausea vomiting. No fever or chills. No melena hematochezia. No dysuria frequency  PHYSICAL EXAM BP 133/78  Pulse 54  Resp 16  Ht 5' 9"  (1.753 m)  Wt 201 lb (91.173 kg)  BMI 29.68 kg/m2  General:  Well-developed, well-nourished in no distress Head: Normocephalic and atraumatic Eyes:PERRLA/EOMI intact, conjunctiva and lids normal Ears: No deformity or lesions Mouth:normal dentition, normal posterior pharynx Neck: Supple, no JVD.  No masses, thyromegaly or abnormal cervical nodes Lungs: Normal breath sounds bilaterally without wheezing.  Normal percussion Cardiac: regular rate and rhythm with normal S1 and S2, no S3 or S4.  PMI is normal.  No pathological murmurs Abdomen: Normal bowel sounds, abdomen is soft and nontender without masses, organomegaly or hernias noted.  No hepatosplenomegaly MSK: Back normal, normal gait muscle strength and tone normal Vascular: Pulse is normal in all 4 extremities Extremities: Trace pitting edema Neurologic: Alert and oriented x 3 Skin: Intact without lesions or rashes Lymphatics: No significant adenopathy Psychologic: Normal affect    ECG: Not available  ASSESSMENT AND PLAN

## 2011-04-20 NOTE — Assessment & Plan Note (Signed)
Patient reports no definite symptoms of angina but had very atypical symptoms prior to his initial presentation. However, if no definite alternative explanation the patient may need to proceed with left and right heart catheterization particularly because he has some lower extremity edema and shortness of breath appears to occur at rest. The RV appears to be mildly dilated. Will obtain BNP level as well as a d-dimer level.

## 2011-04-20 NOTE — Assessment & Plan Note (Signed)
Of note is that the patient gained 20 pounds over the last several months. He feels this is related to increased fluids retention. Will await BNP level. Marland Kitchen

## 2011-04-20 NOTE — Telephone Encounter (Signed)
Received call from [place] in the [facility] lab re: D-Dimer on Dwayne Garrett - elevated @ 1.03.  Called pt at home and discussed lab result with recommendation for CTA chest to r/o PE.  He informed me that after he saw Dr. Dannielle Burn in the office today he went and jogged for 20-35mns w/o Ss or limitations.  He plans to call the office in the am to discuss lab result further with Dr. DDannielle Burnrather than going to ER tonight.  He knows to present to ER for any cp or dyspnea tonight.

## 2011-04-21 NOTE — Telephone Encounter (Signed)
Please refer patient for CTA chest today: pulmonary embolus protocol. Radiologist to call me with results on cellphone [phone]

## 2011-04-21 NOTE — Telephone Encounter (Signed)
Addended by: Marcille Buffy on: 04/21/2011 08:26 AM   Modules accepted: Orders

## 2011-04-21 NOTE — Telephone Encounter (Signed)
Vicky to schedule.

## 2011-09-03 ENCOUNTER — Other Ambulatory Visit: Payer: Self-pay | Admitting: Cardiology

## 2011-10-18 ENCOUNTER — Other Ambulatory Visit: Payer: Self-pay | Admitting: *Deleted

## 2011-10-18 MED ORDER — PRASUGREL HCL 10 MG PO TABS: 10.0000 mg | ORAL_TABLET | Freq: Every day | ORAL | Status: DC

## 2011-10-18 MED ORDER — METOPROLOL SUCCINATE ER 25 MG PO TB24: 25.0000 mg | ORAL_TABLET | Freq: Every day | ORAL | Status: DC

## 2011-11-12 ENCOUNTER — Ambulatory Visit (INDEPENDENT_AMBULATORY_CARE_PROVIDER_SITE_OTHER): Payer: PRIVATE HEALTH INSURANCE | Admitting: Cardiology

## 2011-11-12 ENCOUNTER — Encounter: Payer: Self-pay | Admitting: Cardiology

## 2011-11-12 VITALS — BP 120/80 | HR 69 | Ht 69.0 in | Wt 206.8 lb

## 2011-11-12 DIAGNOSIS — R600 Localized edema: Secondary | ICD-10-CM

## 2011-11-12 DIAGNOSIS — K7689 Other specified diseases of liver: Secondary | ICD-10-CM

## 2011-11-12 DIAGNOSIS — I2510 Atherosclerotic heart disease of native coronary artery without angina pectoris: Secondary | ICD-10-CM

## 2011-11-12 DIAGNOSIS — G4733 Obstructive sleep apnea (adult) (pediatric): Secondary | ICD-10-CM

## 2011-11-12 DIAGNOSIS — R609 Edema, unspecified: Secondary | ICD-10-CM

## 2011-11-12 NOTE — Patient Instructions (Addendum)
Your physician recommends that you schedule a follow-up appointment in: 1 year. You will receive a reminder letter in the mail in about 10 months reminding you to call and schedule your appointment. If you don't receive this letter, please contact our office.   Your physician has recommended you make the following change in your medication: Stop effient when you finish your current supply Change metoprolol succinate to 1/2 daily/once his bottle finishes, you may stop this also.

## 2011-12-11 NOTE — Assessment & Plan Note (Signed)
Patient not using CPAP anymore after was a significant amount of weight.

## 2011-12-11 NOTE — Progress Notes (Signed)
Dwayne Real, MD, [facility] ABIM Board Certified in Adult Cardiovascular Medicine,Internal Medicine and Critical Care Medicine    CC: followup patient with coronary artery disease.  HPI:  Patient has a history of prior coronary intervention.  He has had no recurrent chest pain.  He reportedly around Christmas some fluid in the lower extremities but this has resolved.  This is likely related to problems with his right knee. The patient however does a lot of walking.he reports no shortness of breath orthopnea or PND.  The stable from a cardiovascular perspective. Reportedly had a CT scan done last year which was negative for pulmonary embolism. He is questioning how long he has to stay on dual antiplatelet therapy.   PMH: reviewed and listed in Problem List in Electronic Records (and see below) Past Medical History  Diagnosis Date  . CAD (coronary artery disease)     multivessel (moderate), DES RCA 4/11, LVEF 60-65%  . Hypertension   . Myocardial infarction     IMI- 4/11  . OSA (obstructive sleep apnea)     on CPAP  . Abnormal LFTs   . Castleman's disease    Past Surgical History  Procedure Date  . Skin graft     to finger  . Tumor removal     abdominal  . Bone cyst excision     cyst removal from neck    Allergies/SH/FHX : available in Electronic Records for review  No Known Allergies History   Social History  . Marital Status: Married    Spouse Name: N/A    Number of Children: N/A  . Years of Education: N/A   Occupational History  . machinist    Social History Main Topics  . Smoking status: Former Research scientist (life sciences)  . Smokeless tobacco: Never Used  . Alcohol Use: No  . Drug Use: No  . Sexually Active: Not on file   Other Topics Concern  . Not on file   Social History Narrative  . No narrative on file   Family History  Problem Relation Age of Onset  . Heart attack Brother 39    Medications: Current Outpatient Prescriptions  Medication Sig Dispense Refill  .  amLODipine (NORVASC) 10 MG tablet Take 10 mg by mouth daily.       Marland Kitchen aspirin 325 MG EC tablet Take 325 mg by mouth daily. Take 1/2 tablet daily.      . furosemide (LASIX) 40 MG tablet Take 20 mg by mouth daily as needed.       . lansoprazole (PREVACID) 15 MG capsule Take 30 mg by mouth as needed.       . metoprolol succinate (TOPROL-XL) 25 MG 24 hr tablet Take 12.5 mg by mouth daily. When current supply finishes, stop medication      . Multiple Vitamin (MULTIVITAMIN) tablet Take 1 tablet by mouth daily.        . nitroGLYCERIN (NITROSTAT) 0.4 MG SL tablet Place 0.4 mg under the tongue every 5 (five) minutes as needed.        . Omega-3 Fatty Acids (FISH OIL) 1000 MG CAPS Take 3 capsules by mouth daily.        . prasugrel (EFFIENT) 10 MG TABS Take 10 mg by mouth daily. Stop this medication when current supply finishes      . pravastatin (PRAVACHOL) 40 MG tablet TAKE 1 TABLET BY MOUTH DAILY AT BEDTIME  90 tablet  3  . valsartan-hydrochlorothiazide (DIOVAN-HCT) 320-12.5 MG per tablet Take 1 tablet by  mouth daily.        . Vitamins C E (VITAMIN C & E COMPLEX) 500-400 MG-UNIT CAPS Take 2 capsules by mouth daily.        ROS: No nausea or vomiting. No fever or chills.No melena or hematochezia.No bleeding.No claudication  Physical Exam: BP 120/80  Pulse 69  Ht 5' 9"  (1.753 m)  Wt 206 lb 12.8 oz (93.804 kg)  BMI 30.54 kg/m2 General:well-nourished male in no distress. Neck:normal carotid upstroke and no carotid bruit.  No thyromegaly non-nodular thyroid.  JVP is 6 cm Lungs:clear breath sounds bilaterally no wheezing Cardiac:regular rate and rhythm with normal S1 and S2 and no murmur rubs or gallops Vascular:no edema.  Normal distal pulses Skin:warm and dry Physcologic:normal affect  12lead [facility] sinus rhythm otherwise normal tracing Limited bedside ECHO:N/A No images are attached to the encounter.   Assessment and Plan  CAD Patient to stopped Effient when  supplies finished.   Drug-eluting stents were placed in 2011.  He stable otherwise from coronary artery disease perspective.patient can also wean metoprolol to off.  SLEEP APNEA, OBSTRUCTIVE Patient not using CPAP anymore after was a significant amount of weight.  Lower extremity edema Improved.  FATTY LIVER DISEASE Followed by the patient's primary care physician   Patient Active Problem List  Diagnosis  . MIXED HYPERLIPIDEMIA  . SLEEP APNEA, OBSTRUCTIVE  . CAD  . FATTY LIVER DISEASE  . CASTLEMAN'S DISEASE  . ED (erectile dysfunction)  . Lower extremity edema

## 2011-12-11 NOTE — Assessment & Plan Note (Signed)
Improved

## 2011-12-11 NOTE — Assessment & Plan Note (Signed)
Followed by the patient's primary care physician

## 2011-12-11 NOTE — Assessment & Plan Note (Addendum)
Patient to stop at the end when his supplies finished.  Drug-eluting stents were placed in 2011.  He stable otherwise from coronary artery disease perspective.patient can also wean metoprolol to off.

## 2012-11-14 ENCOUNTER — Ambulatory Visit: Payer: PRIVATE HEALTH INSURANCE | Admitting: Cardiology

## 2012-11-29 ENCOUNTER — Ambulatory Visit (INDEPENDENT_AMBULATORY_CARE_PROVIDER_SITE_OTHER): Payer: PRIVATE HEALTH INSURANCE | Admitting: Cardiology

## 2012-11-29 ENCOUNTER — Encounter: Payer: Self-pay | Admitting: Cardiology

## 2012-11-29 VITALS — BP 161/90 | HR 71 | Ht 69.0 in | Wt 225.0 lb

## 2012-11-29 DIAGNOSIS — R600 Localized edema: Secondary | ICD-10-CM

## 2012-11-29 DIAGNOSIS — R609 Edema, unspecified: Secondary | ICD-10-CM

## 2012-11-29 DIAGNOSIS — I2510 Atherosclerotic heart disease of native coronary artery without angina pectoris: Secondary | ICD-10-CM

## 2012-11-29 DIAGNOSIS — E782 Mixed hyperlipidemia: Secondary | ICD-10-CM

## 2012-11-29 DIAGNOSIS — G4733 Obstructive sleep apnea (adult) (pediatric): Secondary | ICD-10-CM

## 2012-11-29 NOTE — Assessment & Plan Note (Signed)
Reports screening through work, has had intolerance to statins.

## 2012-11-29 NOTE — Assessment & Plan Note (Signed)
Chronic recurring problem. He might be able to tolerate above-the-knee compression hose of less pressure strength.

## 2012-11-29 NOTE — Assessment & Plan Note (Signed)
Symptomatically stable on medical therapy. ECG is normal today. Recommend continued activity, symptom observation for now. Followup is arranged.

## 2012-11-29 NOTE — Assessment & Plan Note (Signed)
Keep followup with Dr. Sherrie Sport. He is on CPAP.

## 2012-11-29 NOTE — Patient Instructions (Addendum)

## 2012-11-29 NOTE — Progress Notes (Signed)
Clinical Summary  Mr. Klemann is a 57 y.o.male presenting for an office visit, a former patient of Dr. Dannielle Burn. He was last seen in June 2013.  He reports no angina symptoms, medically managed CAD at baseline. He reports compliance with his medications, has had his lipids checked with a health screening through work. He states that he has not tolerated statin therapy, but is able to take omega-3 supplements.  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  Main complaint is of chronic recurring leg edema, mainly dependent, also involves his knees which suffer from arthritis. He was not able to tolerate below-the-knee compression hose ordered by Dr. Sherrie Sport previously.  ECG today shows normal sinus rhythm.   No Known Allergies  Current Outpatient Prescriptions  Medication Sig Dispense Refill  . aspirin 325 MG EC tablet Take 325 mg by mouth daily. Take 1/2 tablet daily.      . lansoprazole (PREVACID) 15 MG capsule Take 30 mg by mouth daily.       . meloxicam (MOBIC) 15 MG tablet Take 1 tablet by mouth daily.      . metoprolol tartrate (LOPRESSOR) 25 MG tablet Take 1 tablet by mouth daily.      . Multiple Vitamin (MULTIVITAMIN) tablet Take 1 tablet by mouth daily.        . nitroGLYCERIN (NITROSTAT) 0.4 MG SL tablet Place 0.4 mg under the tongue every 5 (five) minutes as needed.        . Omega-3 Fatty Acids (FISH OIL) 1000 MG CAPS Take 3 capsules by mouth daily.        . valsartan-hydrochlorothiazide (DIOVAN-HCT) 320-12.5 MG per tablet Take 1 tablet by mouth daily.         No current facility-administered medications for this visit.    Past Medical History  Diagnosis Date  . Coronary atherosclerosis of native coronary artery     Multivessel (moderate), DES RCA 4/11, LVEF 60-65%  . Essential hypertension, benign   . 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 OSA (obstructive sleep apnea)     on CPAP  . Abnormal LFTs   . Castleman's  disease   . Bilateral leg edema     Chronic    Social History Mr. Mucci reports that he quit smoking about 8 years ago. His smoking use included Cigarettes. He smoked 0.00 packs per day. His smokeless tobacco use includes Chew. Mr. Hollenbaugh reports that he does not drink alcohol.  Review of Systems No palpitations, orthopnea, PND. Stable appetite.  Physical Examination Filed Vitals:   11/29/12 1357  BP: 161/90  Pulse: 71   Filed Weights   11/29/12 1357  Weight: 225 lb (102.059 kg)   Comfortable at rest. HEENT: Conjunctiva and lids normal, oropharynx clear. Neck: Supple, no elevated JVP or carotid bruits, no thyromegaly. Lungs: Clear to auscultation, nonlabored breathing at rest. Cardiac: Regular rate and rhythm, no S3 or significant systolic murmur, no pericardial rub. Abdomen: Soft, nontender, bowel sounds present. Extremities: 1+ edema, distal pulses 2+. Skin: Warm and dry. Several tattoos. Musculoskeletal: No kyphosis. Neuropsychiatric: Alert and oriented x3, affect grossly appropriate.   Problem List and Plan   Coronary atherosclerosis of native coronary artery Symptomatically stable on medical therapy. ECG is normal today. Recommend continued activity, symptom observation for now. Followup is arranged.  Lower extremity edema Chronic recurring problem. He might be able to tolerate above-the-knee compression hose of less pressure strength.  Mixed  hyperlipidemia Reports screening through work, has had intolerance to statins.  SLEEP APNEA, OBSTRUCTIVE Keep followup with Dr. Sherrie Sport. He is on CPAP.    Satira Sark, M.D., F.A.C.C.

## 2013-01-22 ENCOUNTER — Encounter: Payer: Self-pay | Admitting: *Deleted

## 2013-01-22 NOTE — Progress Notes (Signed)
Patient walked into office requesting to be seen due to having increased swelling of face upper and lower extremities. Patient also c/o sob, dizziness. Patient saw PCP last week about the swelling and he made some medication adjustments including stopping amlodipine and starting cardura. Patient states he felt a slight pain in his right lower chest area. Patient didn't take any nitroglycerin but is in the process of picking up a new prescription. Nurse inquired about the compression hose and patient stated that he hasn't been wearing them because of it causing more swelling in the knee area. Nurse observed both upper and lower extremities and noticed patient to have 1+ pitting LEE. Nurse also noticed some swelling and tightness in both arms. Patient weighed 238 lbs today. MD K. Informed and suggest the following for patient. Patient should come to office for nurse visit to check BP and review medications. If BP okay, patient to start HCTZ 12.5 mg daily , have f/u BMET in 5 days after starting HCTZ. Patient informed and verbalized understanding of plan.

## 2013-01-23 ENCOUNTER — Ambulatory Visit (INDEPENDENT_AMBULATORY_CARE_PROVIDER_SITE_OTHER): Payer: PRIVATE HEALTH INSURANCE | Admitting: *Deleted

## 2013-01-23 ENCOUNTER — Encounter: Payer: Self-pay | Admitting: *Deleted

## 2013-01-23 VITALS — BP 136/83 | HR 66 | Ht 69.0 in | Wt 233.0 lb

## 2013-01-23 DIAGNOSIS — R609 Edema, unspecified: Secondary | ICD-10-CM

## 2013-01-23 MED ORDER — HYDROCHLOROTHIAZIDE 12.5 MG PO CAPS: 12.5000 mg | ORAL_CAPSULE | Freq: Every day | ORAL | Status: DC

## 2013-01-23 NOTE — Progress Notes (Signed)
Patient presents to office today for scheduled nurse visit to check BP and medications per Dr. Raliegh Ip. Patient has taken all doses of medications. No side effects noted. Patient denies increased sob, chest pain or dizziness. MD informed and advised patient to proceed with plan of starting HCTZ 12.5 mg daily in addition to current medications and BMET in 5 days. Patient instructed to monitor his BP and call our office if his results are too low.

## 2013-01-23 NOTE — Patient Instructions (Addendum)
Your physician has recommended you make the following change in your medication: Start Hydrochlorothiazide 12.5 mg daily. Your new prescription has been sent to your pharmacy. All other medications will remain the same. Your physician recommends that you return for lab work in: 5 days around January 29, 2013 for BMET. Please have this done at [facility]. Your physician recommends that you schedule a follow-up appointment in: as planned. Your physician has requested that you regularly monitor and record your blood pressure readings at home. Please use the same machine at the same time of day to check your readings and record them. Please call our office if your blood pressure becomes too low.

## 2013-04-12 ENCOUNTER — Telehealth: Payer: Self-pay | Admitting: *Deleted

## 2013-04-12 NOTE — Telephone Encounter (Signed)
No clear cardiac indication for antibiotic prophylaxis to undergo dental procedure.

## 2013-04-16 NOTE — Telephone Encounter (Signed)
Information faxed to dentist office.

## 2013-04-26 ENCOUNTER — Other Ambulatory Visit: Payer: Self-pay

## 2014-02-18 ENCOUNTER — Ambulatory Visit: Payer: PRIVATE HEALTH INSURANCE | Admitting: Cardiology

## 2014-03-06 ENCOUNTER — Ambulatory Visit (INDEPENDENT_AMBULATORY_CARE_PROVIDER_SITE_OTHER): Payer: PRIVATE HEALTH INSURANCE | Admitting: Cardiology

## 2014-03-06 ENCOUNTER — Encounter: Payer: Self-pay | Admitting: Cardiology

## 2014-03-06 VITALS — BP 130/84 | HR 67 | Ht 69.0 in | Wt 241.0 lb

## 2014-03-06 DIAGNOSIS — E782 Mixed hyperlipidemia: Secondary | ICD-10-CM

## 2014-03-06 DIAGNOSIS — Z01810 Encounter for preprocedural cardiovascular examination: Secondary | ICD-10-CM

## 2014-03-06 DIAGNOSIS — I2510 Atherosclerotic heart disease of native coronary artery without angina pectoris: Secondary | ICD-10-CM

## 2014-03-06 NOTE — Progress Notes (Signed)
Clinical Summary [name] is a 58 y.o.male last seen in the office back in June 2014. He is being considered for elective left knee replacement with Dr. Case at [facility]. Reports progressive arthritic pains in both legs, worse in his left knee. This limits his activity significantly.  From a cardiac perspective he has been stable, reports no angina or unusual shortness of breath. [place] with his [place], has been clearing land and doing other outdoor activities as his knee will allow, does not report any angina or CHF symptoms. ECG today is normal.  Lexiscan Cardiolite from October 2012 indicated probable inferior wall attenuation rule out mild inferior ischemia with imaging artifact due to. to increased radiotracer uptake within the gut. LVEF was 58%. He was managed medically.   No Known Allergies  Current Outpatient Prescriptions  Medication Sig Dispense Refill  . aspirin 325 MG EC tablet Take 325 mg by mouth daily.       . lansoprazole (PREVACID) 30 MG capsule Take 30 mg by mouth daily.      . Multiple Vitamin (MULTIVITAMIN) tablet Take 1 tablet by mouth daily.        . nitroGLYCERIN (NITROSTAT) 0.4 MG SL tablet Place 0.4 mg under the tongue every 5 (five) minutes as needed.        . Omega-3 Fatty Acids (FISH OIL) 1000 MG CAPS Take 3 capsules by mouth daily.        . valsartan-hydrochlorothiazide (DIOVAN-HCT) 320-12.5 MG per tablet Take 1 tablet by mouth daily.         No current facility-administered medications for this visit.    Past Medical History  Diagnosis Date  . Coronary atherosclerosis of native coronary artery     Multivessel (moderate), DES RCA 4/11, LVEF 60-65%  . Essential hypertension, benign   . 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 OSA (obstructive sleep apnea)     on CPAP  . Abnormal LFTs   . Castleman's disease   . Bilateral leg edema     Chronic    Social History Dwayne Garrett reports that he quit smoking about 9 years ago. His  smoking use included Cigarettes. He smoked 0.00 packs per day. His smokeless tobacco use includes Chew. Dwayne Garrett reports that he does not drink alcohol.  Review of Systems No palpitations, dizziness, syncope. No orthopnea or PND. No claudication. Other systems reviewed and negative except as outlined.  Physical Examination Filed Vitals:   03/06/14 1022  BP: 130/84  Pulse: 67   Filed Weights   03/06/14 1022  Weight: 241 lb (109.317 kg)    Comfortable at rest.  HEENT: Conjunctiva and lids normal, oropharynx clear.  Neck: Supple, no elevated JVP or carotid bruits, no thyromegaly.  Lungs: Clear to auscultation, nonlabored breathing at rest.  Cardiac: Regular rate and rhythm, no S3 or significant systolic murmur, no pericardial rub.  Abdomen: Soft, nontender, bowel sounds present.  Extremities: Trace edema, distal pulses 2+.  Skin: Warm and dry. Several tattoos.  Musculoskeletal: No kyphosis.  Neuropsychiatric: Alert and oriented x3, affect grossly appropriate.   Problem List and Plan   Preoperative cardiovascular examination Patient being considered for left knee replacement with Dr. Case at [facility]. From a cardiac perspective he is stable on medical therapy without progressive angina or CHF symptoms, describes activities well exceeding 4 METs. Cardiac testing from 3 years ago noted above. ECG today is normal. Patient is cleared to proceed with surgery at an  acceptable perioperative cardiac risk. We will schedule followup for him in the next 6 months, sooner if need.  Mixed hyperlipidemia He follows with Dr. Sherrie Sport, on omega-3 supplements.  Coronary atherosclerosis of native coronary artery Status post DES to the RCA in 2011, LVEF normal range.    Satira Sark, M.D., F.A.C.C.

## 2014-03-06 NOTE — Patient Instructions (Signed)

## 2014-03-06 NOTE — Assessment & Plan Note (Signed)
He follows with Dr. Sherrie Sport, on omega-3 supplements.

## 2014-03-06 NOTE — Assessment & Plan Note (Signed)
Patient being considered for left knee replacement with Dr. Case at [facility]. From a cardiac perspective he is stable on medical therapy without progressive angina or CHF symptoms, describes activities well exceeding 4 METs. Cardiac testing from 3 years ago noted above. ECG today is normal. Patient is cleared to proceed with surgery at an acceptable perioperative cardiac risk. We will schedule followup for him in the next 6 months, sooner if need.

## 2014-03-06 NOTE — Assessment & Plan Note (Signed)
Status post DES to the RCA in 2011, LVEF normal range.

## 2014-12-09 ENCOUNTER — Ambulatory Visit (INDEPENDENT_AMBULATORY_CARE_PROVIDER_SITE_OTHER): Payer: PRIVATE HEALTH INSURANCE | Admitting: Adult Health

## 2014-12-09 ENCOUNTER — Other Ambulatory Visit (HOSPITAL_COMMUNITY)
Admission: RE | Admit: 2014-12-09 | Discharge: 2014-12-09 | Disposition: A | Discharge status: Home / Self Care | Payer: PRIVATE HEALTH INSURANCE | Source: Ambulatory Visit | Attending: Adult Health | Admitting: Adult Health

## 2014-12-09 ENCOUNTER — Encounter: Payer: Self-pay | Admitting: *Deleted

## 2014-12-09 ENCOUNTER — Encounter: Payer: Self-pay | Admitting: Adult Health

## 2014-12-09 ENCOUNTER — Other Ambulatory Visit: Payer: Self-pay | Admitting: Adult Health

## 2014-12-09 ENCOUNTER — Ambulatory Visit (HOSPITAL_COMMUNITY)
Admission: RE | Admit: 2014-12-09 | Discharge: 2014-12-09 | Disposition: A | Discharge status: Home / Self Care | Payer: PRIVATE HEALTH INSURANCE | Source: Ambulatory Visit | Attending: Adult Health | Admitting: Adult Health

## 2014-12-09 VITALS — BP 140/84 | HR 81 | Ht 69.0 in | Wt 240.0 lb

## 2014-12-09 DIAGNOSIS — Z87891 Personal history of nicotine dependence: Secondary | ICD-10-CM | POA: Insufficient documentation

## 2014-12-09 DIAGNOSIS — I209 Angina pectoris, unspecified: Secondary | ICD-10-CM

## 2014-12-09 DIAGNOSIS — I2510 Atherosclerotic heart disease of native coronary artery without angina pectoris: Secondary | ICD-10-CM | POA: Insufficient documentation

## 2014-12-09 DIAGNOSIS — Z01818 Encounter for other preprocedural examination: Secondary | ICD-10-CM | POA: Diagnosis not present

## 2014-12-09 LAB — BASIC METABOLIC PANEL
ANION GAP: 9 (ref 5–15)
BUN: 22 mg/dL — AB (ref 6–20)
CO2: 28 mmol/L (ref 22–32)
CREATININE: 0.76 mg/dL (ref 0.61–1.24)
Calcium: 9.7 mg/dL (ref 8.9–10.3)
Chloride: 102 mmol/L (ref 101–111)
GFR calc Af Amer: >60 mL/min (ref >60)
Glucose, Bld: 123 mg/dL — ABNORMAL HIGH (ref 65–99)
Potassium: 4 mmol/L (ref 3.5–5.1)
Sodium: 139 mmol/L (ref 135–145)

## 2014-12-09 LAB — CBC WITH DIFFERENTIAL/PLATELET
BASOS ABS: 0.1 10*3/uL (ref 0.0–0.1)
BASOS PCT: 1 % (ref 0–1)
Eosinophils Absolute: 0.2 10*3/uL (ref 0.0–0.7)
Eosinophils Relative: 4 % (ref 0–5)
HCT: 42.8 % (ref 39.0–52.0)
Hemoglobin: 14.5 g/dL (ref 13.0–17.0)
Lymphocytes Relative: 37 % (ref 12–46)
Lymphs Abs: 2.3 10*3/uL (ref 0.7–4.0)
MCH: 31.3 pg (ref 26.0–34.0)
MCHC: 33.9 g/dL (ref 30.0–36.0)
MCV: 92.4 fL (ref 78.0–100.0)
Monocytes Absolute: 0.8 10*3/uL (ref 0.1–1.0)
Monocytes Relative: 12 % (ref 3–12)
NEUTROS PCT: 46 % (ref 43–77)
Neutro Abs: 2.9 10*3/uL (ref 1.7–7.7)
Platelets: 205 10*3/uL (ref 150–400)
RBC: 4.63 MIL/uL (ref 4.22–5.81)
RDW: 12.9 % (ref 11.5–15.5)
WBC: 6.2 10*3/uL (ref 4.0–10.5)

## 2014-12-09 LAB — PROTIME-INR
INR: 1.07 (ref 0.00–1.49)
Prothrombin Time: 14.1 seconds (ref 11.6–15.2)

## 2014-12-09 MED ORDER — MAGNESIUM 400 MG PO CAPS: 400.0000 mg | ORAL_CAPSULE | Freq: Every day | ORAL | Status: DC

## 2014-12-09 MED ORDER — NITROGLYCERIN 0.4 MG SL SUBL: 0.4000 mg | SUBLINGUAL_TABLET | SUBLINGUAL | Status: DC | PRN

## 2014-12-09 NOTE — Patient Instructions (Signed)
Your physician has requested that you have a cardiac catheterization. Cardiac catheterization is used to diagnose and/or treat various heart conditions. Doctors may recommend this procedure for a number of different reasons. The most common reason is to evaluate chest pain. Chest pain can be a symptom of coronary artery disease (CAD), and cardiac catheterization can show whether plaque is narrowing or blocking your heart's arteries. This procedure is also used to evaluate the valves, as well as measure the blood flow and oxygen levels in different parts of your heart. For further information please visit [email or web address]. Please follow instruction sheet, as given.  Your physician has recommended you make the following change in your medication:   Start: Magnesium 400 mg Daily  Your physician recommends that you return for lab work in: Today  Please have Chest X-Ray done today.  Thank you for choosing [facility]!

## 2014-12-09 NOTE — Progress Notes (Deleted)
Name: Dwayne Garrett    DOB: 1956-01-25  Age: 59 y.o.  MR#: [id number]       PCP:  Monico Blitz, MD      Insurance: Payor: [place] / Plan: [place] / Product Type: *No Product type* /   CC:    Chief Complaint  Patient presents with  . Coronary Artery Disease    DES to RCA 2011    VS Filed Vitals:   12/09/14 1509  BP: 140/84  Pulse: 81  Height: 5' 9" (1.753 m)  Weight: 240 lb (108.863 kg)  SpO2: 95%    Weights Current Weight  12/09/14 240 lb (108.863 kg)  03/06/14 241 lb (109.317 kg)  01/23/13 233 lb (105.688 kg)    Blood Pressure  BP Readings from Last 3 Encounters:  12/09/14 140/84  03/06/14 130/84  01/23/13 136/83     Admit date:  (Not on file) Last encounter with RMR:  Visit date not found   Allergy Review of patient's allergies indicates no known allergies.  Current Outpatient Prescriptions  Medication Sig Dispense Refill  . aspirin 325 MG EC tablet Take 325 mg by mouth daily.     Marland Kitchen doxazosin (CARDURA) 4 MG tablet Take 4 mg by mouth daily.    . furosemide (LASIX) 20 MG tablet Take 20 mg by mouth daily.    . lansoprazole (PREVACID) 30 MG capsule Take 30 mg by mouth daily.    . Multiple Vitamin (MULTIVITAMIN) tablet Take 1 tablet by mouth daily.      . nitroGLYCERIN (NITROSTAT) 0.4 MG SL tablet Place 0.4 mg under the tongue every 5 (five) minutes as needed.      . Omega-3 Fatty Acids (FISH OIL) 1000 MG CAPS Take 3 capsules by mouth daily.      . valsartan-hydrochlorothiazide (DIOVAN-HCT) 320-12.5 MG per tablet Take 1 tablet by mouth daily.      . metFORMIN (GLUCOPHAGE) 500 MG tablet Take 500 mg by mouth 2 (two) times daily.  3  . metoprolol succinate (TOPROL-XL) 25 MG 24 hr tablet Take 25 mg by mouth daily.  2  . ONETOUCH VERIO test strip TEST DAILY E11.9  3   No current facility-administered medications for this visit.    Discontinued Meds:   There are no discontinued medications.  Patient Active Problem List   Diagnosis  Date Noted  . Preoperative cardiovascular examination 03/06/2014  . Lower extremity edema 04/20/2011  . Mixed hyperlipidemia 05/04/2010  . SLEEP APNEA, OBSTRUCTIVE 10/20/2009  . Coronary atherosclerosis of native coronary artery 10/20/2009    LABS    Component Value Date/Time   NA 139 10/01/2009 0613   NA 141 09/30/2009 0430   NA 139 09/29/2009 0442   K 3.8 10/01/2009 0613   K 3.9 09/30/2009 0430   K 3.6 09/29/2009 0442   CL 108 10/01/2009 0613   CL 111 09/30/2009 0430   CL 109 09/29/2009 0442   CO2 24 10/01/2009 0613   CO2 25 09/30/2009 0430   CO2 24 09/29/2009 0442   GLUCOSE 91 10/01/2009 0613   GLUCOSE 97 09/30/2009 0430   GLUCOSE 133* 09/29/2009 0442   BUN 13 10/01/2009 0613   BUN 13 09/30/2009 0430   BUN 15 09/29/2009 0442   CREATININE 0.94 10/01/2009 0613   CREATININE 0.92 09/30/2009 0430   CREATININE 0.84 09/29/2009 0442   CALCIUM 8.7 10/01/2009 0613   CALCIUM 8.6 09/30/2009 0430   CALCIUM 8.5 09/29/2009 0442   GFRNONAA >60 10/01/2009 0613   GFRNONAA >  60 09/30/2009 0430   GFRNONAA >60 09/29/2009 0442   GFRAA  10/01/2009 0613    >60        The eGFR has been calculated using the MDRD equation. This calculation has not been validated in all clinical situations. eGFR's persistently <60 mL/min signify possible Chronic Kidney Disease.   GFRAA  09/30/2009 0430    >60        The eGFR has been calculated using the MDRD equation. This calculation has not been validated in all clinical situations. eGFR's persistently <60 mL/min signify possible Chronic Kidney Disease.   GFRAA  09/29/2009 0442    >60        The eGFR has been calculated using the MDRD equation. This calculation has not been validated in all clinical situations. eGFR's persistently <60 mL/min signify possible Chronic Kidney Disease.   CMP     Component Value Date/Time   NA 139 10/01/2009 0613   K 3.8 10/01/2009 0613   CL 108 10/01/2009 0613   CO2 24 10/01/2009 0613   GLUCOSE 91  10/01/2009 0613   BUN 13 10/01/2009 0613   CREATININE 0.94 10/01/2009 0613   CALCIUM 8.7 10/01/2009 0613   PROT 6.8 12/21/2010 0809   ALBUMIN 4.4 12/21/2010 0809   AST 28 12/21/2010 0809   ALT 21 12/21/2010 0809   ALKPHOS 51 12/21/2010 0809   BILITOT 0.8 12/21/2010 0809   GFRNONAA >60 10/01/2009 0613   GFRAA  10/01/2009 0613    >60        The eGFR has been calculated using the MDRD equation. This calculation has not been validated in all clinical situations. eGFR's persistently <60 mL/min signify possible Chronic Kidney Disease.       Component Value Date/Time   WBC 8.2 10/01/2009 0613   WBC 7.8 09/30/2009 0430   WBC 6.6 09/29/2009 0442   HGB 12.2* 10/01/2009 0613   HGB 11.7* 09/30/2009 0430   HGB 11.9* 09/29/2009 0442   HCT 35.0* 10/01/2009 0613   HCT 34.6* 09/30/2009 0430   HCT 35.1* 09/29/2009 0442   MCV 92.1 10/01/2009 0613   MCV 92.7 09/30/2009 0430   MCV 92.3 09/29/2009 0442    Lipid Panel     Component Value Date/Time   CHOL 187 12/21/2010 0809   TRIG 73 12/21/2010 0809   HDL 54 12/21/2010 0809   CHOLHDL 3.5 12/21/2010 0809   VLDL 15 12/21/2010 0809   LDLCALC 118* 12/21/2010 0809    ABG No results found for: PHART, PCO2ART, PO2ART, HCO3, TCO2, ACIDBASEDEF, O2SAT   Lab Results  Component Value Date   TSH 0.814 ***Test methodology is 3rd generation TSH*** 09/28/2009   BNP (last 3 results) No results for input(s): BNP in the last 8760 hours.  ProBNP (last 3 results) No results for input(s): PROBNP in the last 8760 hours.  Cardiac Panel (last 3 results) No results for input(s): CKTOTAL, CKMB, TROPONINI, RELINDX in the last 72 hours.  Iron/TIBC/Ferritin/ %Sat No results found for: IRON, TIBC, FERRITIN, IRONPCTSAT   EKG Orders placed or performed in visit on 03/06/14  . EKG 12-Lead     Prior Assessment and Plan Problem List as of 12/09/2014      Cardiovascular and Mediastinum   Coronary atherosclerosis of native coronary artery   Last  Assessment & Plan 03/06/2014 Office Visit Written 03/06/2014 10:57 AM by Satira Sark, MD    Status post DES to the RCA in 2011, LVEF normal range.        Respiratory  SLEEP APNEA, OBSTRUCTIVE   Last Assessment & Plan 11/29/2012 Office Visit Written 11/29/2012  2:33 PM by Satira Sark, MD    Keep followup with Dr. Sherrie Sport. He is on CPAP.        Other   Mixed hyperlipidemia   Last Assessment & Plan 03/06/2014 Office Visit Written 03/06/2014 10:57 AM by Satira Sark, MD    He follows with Dr. Sherrie Sport, on omega-3 supplements.      Lower extremity edema   Last Assessment & Plan 11/29/2012 Office Visit Written 11/29/2012  2:33 PM by Satira Sark, MD    Chronic recurring problem. He might be able to tolerate above-the-knee compression hose of less pressure strength.      Preoperative cardiovascular examination   Last Assessment & Plan 03/06/2014 Office Visit Written 03/06/2014 10:57 AM by Satira Sark, MD    Patient being considered for left knee replacement with Dr. Case at [facility]. From a cardiac perspective he is stable on medical therapy without progressive angina or CHF symptoms, describes activities well exceeding 4 METs. Cardiac testing from 3 years ago noted above. ECG today is normal. Patient is cleared to proceed with surgery at an acceptable perioperative cardiac risk. We will schedule followup for him in the next 6 months, sooner if need.          Imaging: No results found.

## 2014-12-09 NOTE — Progress Notes (Signed)
Cardiology Office Note   Date:  12/09/2014   ID:  Dwayne Garrett, DOB 1955-09-06, MRN [id number]  PCP:  Dwayne Blitz, MD  Cardiologist:  Dwayne Garrett/ Dwayne Sims, NP   Chief Complaint  Patient presents with  . Coronary Artery Disease    DES to RCA 2011      History of Present Illness: Dwayne Garrett is a 59 y.o. male who presents for ongoing assessment and management of CAD, with known history of drug-eluting stentto the right coronary artery in April of 2011 and 1st diagonal preserved LV systolic function, hypertension, with a history of OSA on CPAP.  The patient was last seen in the office in September 2015 for preoperative evaluation.  In the setting of left knee replacement.  It was noted that Dwayne Garrett from October 2012 indicated probable inferior wall attenuation.  Was found to be a low risk study and he was to continue medical management  Cardiac catheterization per Dr. Delorise Garrett on 09/28/2009: left main had long, diffuse 50% stenosis in the midportion of the vessel.  Circumflex gave off a moderate size obtuse marginal branch.  There appeared to be a 60% stenosis in the proximal portion of the circumflex artery.  The left anterior descending artery was a large vessel that coursed to the apex and gave off a large caliber diagonal branch.  The LAD had a 50% stenosis in the proximal portion of the vessel.  There was a 50% stenosis in the midportion of the vessel.  There was distal plaque disease.  The diagonal is a large caliber vessel that had an 80% tubular stenosis in the proximal portion of the vessel.  The right coronary artery had 100% total occlusion in the proximal portion.  LVEF revealed septal hypokinesis of the inferior wall with EF of 65%..  The patient comes today stating that he has been having increasing fatigue, chest pressure, diaphoresis.  He also complains of lower extremity pain, and restless legs.  The patient had his knee replacement in October of  2015.  He states that he was doing well until the last few months, when he has had increasing symptoms of dyspnea and fatigue.  He has a lot of land that he likes to walk his dogs on and is unable to walk more than approximately 50 feet without having to stop and rest.  He states that about a treadmill and had been walking on it until recently due to symptoms.the patient states that he can be at rest and felt pressure in his chest and have diaphoresis.  He has recently changed primary care physicians and has been diagnosed with diabetes.  He has recently been started on metformin 500 mg twice a day.  He continues on metoprolol, aspirin, Lasix, as needed for lower extremity edema.  Postoperatively.  He states he has taken nitroglycerin with refills.  The chest pressure, and it helped him to feel better.   Past Medical History  Diagnosis Date  . Coronary atherosclerosis of native coronary artery     Multivessel (moderate), DES RCA 4/11, LVEF 60-65%  . Essential hypertension, benign   . 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 OSA (obstructive sleep apnea)     on CPAP  . Abnormal LFTs   . Castleman's disease   . Bilateral leg edema     Chronic    Past Surgical History  Procedure Laterality Date  . Skin graft      Finger  . Tumor removal  Abdominal  . Bone cyst excision      Cyst removal from neck     Current Outpatient Prescriptions  Medication Sig Dispense Refill  . aspirin 325 MG EC tablet Take 325 mg by mouth daily.     . lansoprazole (PREVACID) 30 MG capsule Take 30 mg by mouth daily.    . Multiple Vitamin (MULTIVITAMIN) tablet Take 1 tablet by mouth daily.      . nitroGLYCERIN (NITROSTAT) 0.4 MG SL tablet Place 0.4 mg under the tongue every 5 (five) minutes as needed.      . Omega-3 Fatty Acids (FISH OIL) 1000 MG CAPS Take 3 capsules by mouth daily.      . valsartan-hydrochlorothiazide (DIOVAN-HCT) 320-12.5 MG per tablet Take 1 tablet by mouth daily.       No current  facility-administered medications for this visit.    Allergies:   Review of patient's allergies indicates no known allergies.    Social History:  The patient  reports that he quit smoking about 10 years ago. His smoking use included Cigarettes. His smokeless tobacco use includes Chew. He reports that he does not drink alcohol or use illicit drugs.   Family History:  The patient's family history includes Heart attack (age of onset: 51) in his brother.    ROS: .   All other systems are reviewed and negative.Unless otherwise mentioned in H&P above.   PHYSICAL EXAM: VS:  There were no vitals taken for this visit. , BMI There is no weight on file to calculate BMI. GEN: Well nourished, well developed, in no acute distress HEENT: normal Neck: no JVD, right carotid bruit, or masses Cardiac: RRR; no murmurs, rubs, or gallops,no edema  Respiratory:  Clear to auscultation bilaterally, normal work of breathing GI: soft, nontender, nondistended, + BS. Obese. MS: no deformity or atrophy Skin: warm and dry, no rash Neuro:  Strength and sensation are intact Psych: euthymic mood, full affect   EKG:  The ekg ordered today demonstrates NSR QTc 387. Rate of 70 bpm.    Recent Labs: No results found for requested labs within last 365 days.    Lipid Panel    Component Value Date/Time   CHOL 187 12/21/2010 0809   TRIG 73 12/21/2010 0809   HDL 54 12/21/2010 0809   CHOLHDL 3.5 12/21/2010 0809   VLDL 15 12/21/2010 0809   LDLCALC 118* 12/21/2010 0809      Wt Readings from Last 3 Encounters:  03/06/14 241 lb (109.317 kg)  01/23/13 233 lb (105.688 kg)  11/29/12 225 lb (102.059 kg)      Other studies Reviewed: Additional studies/ records that were reviewed today include: Cardiac cath 2011 Review of the above records demonstrates: As above   ASSESSMENT AND PLAN:  1.  CAD: Known history of multivessel disease with stent to the right coronary artery in the proximal portion, and in the  first diagonal.  This was completed in 2011.  The patient is now having recurrent symptoms of chest pain, fatigue, shortness of breath, and diaphoresis.  This occasionally happens at rest, but always with exertion.  The patient has been recently diagnosed with diabetes.  I have discussed this with the patient and also with Dr. Harrington Challenger concerning the need to proceed with a repeat cardiac catheterization.she agrees that this would need to be completed with his symptoms. He did have a exercise Myoview, which was completed in 2012 which did not show any reversible ischemia.  However, it was essential normal stress test with  attenuation noted and mild inferior wall ischemia could not be ruled out.  It is recommended that the patient proceed with cardiac catheterization.  Risks, benefits, and procedure have been discussed with the patient to include bleeding, infection, MI, and death.  He verbalizes understanding.  He has had caths before and does want to proceed.he is scheduled with Dr. Emelda Brothers on Wednesday June 22,2016. He has been advised to hold his metformin at morning and will likely need to hold it for the next 24 hours.  He verbalizes understanding.he is not on a statin, stating that he has severe myalgias associated with statin use.  This may need to be readdressed post catheterization.  2. Hypertension: currently well-controlled on ARB, and beta blocker.  3.Restless Legs: We will begin magnesium 400 mg daily for symptomatic relief. He is on a a PPI, which could also be contributing to hypomagnesemia.magnesium level will be added to his pre-cath labs.  4. Diabetes: Recently diagnosed by primary care physician.  Current medicines are reviewed at length with the patient today.    Labs/ tests ordered today include: Pre-Cath labs. No orders of the defined types were placed in this encounter.     Disposition:   FU with post cardiac cath in [place] or [place]. He is seen by Dr. Domenic Polite in  [place], Dwayne Sims, NP  12/09/2014 7:41 AM    [facility]. [address], [place], [place] [address] Phone: [phone]; Fax: [phone]

## 2014-12-11 ENCOUNTER — Encounter (HOSPITAL_COMMUNITY)
Admission: RE | Disposition: A | Discharge status: Home / Self Care | Payer: PRIVATE HEALTH INSURANCE | Source: Ambulatory Visit | Attending: Cardiovascular Disease

## 2014-12-11 ENCOUNTER — Encounter (HOSPITAL_COMMUNITY): Payer: Self-pay | Admitting: Cardiovascular Disease

## 2014-12-11 ENCOUNTER — Ambulatory Visit (HOSPITAL_COMMUNITY)
Admission: RE | Admit: 2014-12-11 | Discharge: 2014-12-11 | Disposition: A | Discharge status: Home / Self Care | Payer: PRIVATE HEALTH INSURANCE | Source: Ambulatory Visit | Attending: Cardiovascular Disease | Admitting: Cardiovascular Disease

## 2014-12-11 DIAGNOSIS — Z87891 Personal history of nicotine dependence: Secondary | ICD-10-CM | POA: Insufficient documentation

## 2014-12-11 DIAGNOSIS — I25110 Atherosclerotic heart disease of native coronary artery with unstable angina pectoris: Secondary | ICD-10-CM | POA: Diagnosis not present

## 2014-12-11 DIAGNOSIS — I2510 Atherosclerotic heart disease of native coronary artery without angina pectoris: Secondary | ICD-10-CM | POA: Diagnosis not present

## 2014-12-11 DIAGNOSIS — Z7982 Long term (current) use of aspirin: Secondary | ICD-10-CM | POA: Diagnosis not present

## 2014-12-11 DIAGNOSIS — Z955 Presence of coronary angioplasty implant and graft: Secondary | ICD-10-CM | POA: Diagnosis not present

## 2014-12-11 DIAGNOSIS — G4733 Obstructive sleep apnea (adult) (pediatric): Secondary | ICD-10-CM | POA: Diagnosis not present

## 2014-12-11 DIAGNOSIS — E119 Type 2 diabetes mellitus without complications: Secondary | ICD-10-CM | POA: Diagnosis not present

## 2014-12-11 DIAGNOSIS — I252 Old myocardial infarction: Secondary | ICD-10-CM | POA: Insufficient documentation

## 2014-12-11 DIAGNOSIS — G2581 Restless legs syndrome: Secondary | ICD-10-CM | POA: Diagnosis not present

## 2014-12-11 DIAGNOSIS — I10 Essential (primary) hypertension: Secondary | ICD-10-CM | POA: Insufficient documentation

## 2014-12-11 DIAGNOSIS — R600 Localized edema: Secondary | ICD-10-CM | POA: Insufficient documentation

## 2014-12-11 HISTORY — PX: CARDIAC CATHETERIZATION: SHX172

## 2014-12-11 LAB — GLUCOSE, CAPILLARY: GLUCOSE-CAPILLARY: 132 mg/dL — AB (ref 65–99)

## 2014-12-11 LAB — MAGNESIUM: MAGNESIUM: 2 mg/dL (ref 1.7–2.4)

## 2014-12-11 SURGERY — LEFT HEART CATH AND CORONARY ANGIOGRAPHY

## 2014-12-11 MED ORDER — SODIUM CHLORIDE 0.9 % IV SOLN: 250.0000 mL | INTRAVENOUS | Status: DC | PRN

## 2014-12-11 MED ORDER — ASPIRIN 81 MG PO CHEW
81.0000 mg | CHEWABLE_TABLET | ORAL | Status: AC
  Administered 2014-12-11: 81 mg via ORAL

## 2014-12-11 MED ORDER — FENTANYL CITRATE (PF) 100 MCG/2ML IJ SOLN
INTRAMUSCULAR | Status: DC | PRN
  Administered 2014-12-11: 50 ug via INTRAVENOUS

## 2014-12-11 MED ORDER — SODIUM CHLORIDE 0.9 % IJ SOLN: 3.0000 mL | INTRAMUSCULAR | Status: DC | PRN

## 2014-12-11 MED ORDER — HEPARIN (PORCINE) IN NACL 2-0.9 UNIT/ML-% IJ SOLN
INTRAMUSCULAR | Status: AC
  Filled 2014-12-11: qty 1500

## 2014-12-11 MED ORDER — LIDOCAINE HCL (PF) 1 % IJ SOLN
INTRAMUSCULAR | Status: AC
  Filled 2014-12-11: qty 30

## 2014-12-11 MED ORDER — MIDAZOLAM HCL 2 MG/2ML IJ SOLN
INTRAMUSCULAR | Status: AC
  Filled 2014-12-11: qty 2

## 2014-12-11 MED ORDER — SODIUM CHLORIDE 0.9 % WEIGHT BASED INFUSION
3.0000 mL/kg/h | INTRAVENOUS | Status: DC
  Administered 2014-12-11: 3 mL/kg/h via INTRAVENOUS

## 2014-12-11 MED ORDER — SODIUM CHLORIDE 0.9 % IJ SOLN: 3.0000 mL | Freq: Two times a day (BID) | INTRAMUSCULAR | Status: DC

## 2014-12-11 MED ORDER — HEPARIN SODIUM (PORCINE) 1000 UNIT/ML IJ SOLN
INTRAMUSCULAR | Status: AC
  Filled 2014-12-11: qty 1

## 2014-12-11 MED ORDER — LIDOCAINE HCL (PF) 1 % IJ SOLN
INTRAMUSCULAR | Status: DC | PRN
  Administered 2014-12-11: 2 mL via SUBCUTANEOUS

## 2014-12-11 MED ORDER — SODIUM CHLORIDE 0.9 % WEIGHT BASED INFUSION: 1.0000 mL/kg/h | INTRAVENOUS | Status: DC

## 2014-12-11 MED ORDER — SODIUM CHLORIDE 0.9 % IV SOLN: INTRAVENOUS | Status: AC

## 2014-12-11 MED ORDER — MIDAZOLAM HCL 2 MG/2ML IJ SOLN
INTRAMUSCULAR | Status: DC | PRN
  Administered 2014-12-11: 2 mg via INTRAVENOUS

## 2014-12-11 MED ORDER — VERAPAMIL HCL 2.5 MG/ML IV SOLN
INTRAVENOUS | Status: AC
  Filled 2014-12-11: qty 2

## 2014-12-11 MED ORDER — VERAPAMIL HCL 2.5 MG/ML IV SOLN
INTRAVENOUS | Status: DC | PRN
  Administered 2014-12-11: 10:00:00 via INTRA_ARTERIAL

## 2014-12-11 MED ORDER — ASPIRIN 81 MG PO CHEW
CHEWABLE_TABLET | ORAL | Status: AC
Start: 2014-12-11 — End: 2014-12-11
  Administered 2014-12-11: 81 mg via ORAL
  Filled 2014-12-11: qty 1

## 2014-12-11 MED ORDER — FENTANYL CITRATE (PF) 100 MCG/2ML IJ SOLN
INTRAMUSCULAR | Status: AC
  Filled 2014-12-11: qty 2

## 2014-12-11 MED ORDER — IOHEXOL 350 MG/ML SOLN
INTRAVENOUS | Status: DC | PRN
  Administered 2014-12-11: 110 mL via INTRAVENOUS

## 2014-12-11 MED ORDER — HEPARIN SODIUM (PORCINE) 1000 UNIT/ML IJ SOLN
INTRAMUSCULAR | Status: DC | PRN
  Administered 2014-12-11: 5000 [IU] via INTRAVENOUS

## 2014-12-11 SURGICAL SUPPLY — 12 items
CATH INFINITI 5 FR JL3.5 (CATHETERS) ×3 IMPLANT
CATH INFINITI 5FR ANG PIGTAIL (CATHETERS) ×3 IMPLANT
CATH INFINITI JR4 5F (CATHETERS) ×3 IMPLANT
DEVICE RAD COMP TR BAND LRG (VASCULAR PRODUCTS) ×3 IMPLANT
GLIDESHEATH SLEND SS 6F .021 (SHEATH) ×3 IMPLANT
KIT HEART LEFT (KITS) ×3 IMPLANT
PACK CARDIAC CATHETERIZATION (CUSTOM PROCEDURE TRAY) ×3 IMPLANT
SYR MEDRAD MARK V 150ML (SYRINGE) ×3 IMPLANT
TRANSDUCER W/STOPCOCK (MISCELLANEOUS) ×3 IMPLANT
TUBING CIL FLEX 10 FLL-RA (TUBING) ×3 IMPLANT
WIRE HI TORQ VERSACORE-J 145CM (WIRE) ×3 IMPLANT
WIRE SAFE-T 1.5MM-J .035X260CM (WIRE) ×3 IMPLANT

## 2014-12-11 NOTE — H&P (View-Only) (Signed)
Cardiology Office Note   Date:  12/09/2014   ID:  Dwayne Garrett, DOB 1955-08-13, MRN [id number]  PCP:  Dwayne Blitz, MD  Cardiologist:  Dwayne Garrett/ Dwayne Sims, NP   Chief Complaint  Patient presents with  . Coronary Artery Disease    DES to RCA 2011      History of Present Illness: Dwayne Garrett is a 59 y.o. male who presents for ongoing assessment and management of CAD, with known history of drug-eluting stentto the right coronary artery in April of 2011 and 1st diagonal preserved LV systolic function, hypertension, with a history of OSA on CPAP.  The patient was last seen in the office in September 2015 for preoperative evaluation.  In the setting of left knee replacement.  It was noted that [place] from October 2012 indicated probable inferior wall attenuation.  Was found to be a low risk study and he was to continue medical management  Cardiac catheterization per Dwayne Garrett on 09/28/2009: left main had long, diffuse 50% stenosis in the midportion of the vessel.  Circumflex gave off a moderate size obtuse marginal branch.  There appeared to be a 60% stenosis in the proximal portion of the circumflex artery.  The left anterior descending artery was a large vessel that coursed to the apex and gave off a large caliber diagonal branch.  The LAD had a 50% stenosis in the proximal portion of the vessel.  There was a 50% stenosis in the midportion of the vessel.  There was distal plaque disease.  The diagonal is a large caliber vessel that had an 80% tubular stenosis in the proximal portion of the vessel.  The right coronary artery had 100% total occlusion in the proximal portion.  LVEF revealed septal hypokinesis of the inferior wall with EF of 65%..  The patient comes today stating that he has been having increasing fatigue, chest pressure, diaphoresis.  He also complains of lower extremity pain, and restless legs.  The patient had his knee replacement in October of  2015.  He states that he was doing well until the last few months, when he has had increasing symptoms of dyspnea and fatigue.  He has a lot of land that he likes to walk his dogs on and is unable to walk more than approximately 50 feet without having to stop and rest.  He states that about a treadmill and had been walking on it until recently due to symptoms.the patient states that he can be at rest and felt pressure in his chest and have diaphoresis.  He has recently changed primary care physicians and has been diagnosed with diabetes.  He has recently been started on metformin 500 mg twice a day.  He continues on metoprolol, aspirin, Lasix, as needed for lower extremity edema.  Postoperatively.  He states he has taken nitroglycerin with refills.  The chest pressure, and it helped him to feel better.   Past Medical History  Diagnosis Date  . Coronary atherosclerosis of native coronary artery     Multivessel (moderate), DES RCA 4/11, LVEF 60-65%  . Essential hypertension, benign   . 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 OSA (obstructive sleep apnea)     on CPAP  . Abnormal LFTs   . Castleman's disease   . Bilateral leg edema     Chronic    Past Surgical History  Procedure Laterality Date  . Skin graft      Finger  . Tumor removal  Abdominal  . Bone cyst excision      Cyst removal from neck     Current Outpatient Prescriptions  Medication Sig Dispense Refill  . aspirin 325 MG EC tablet Take 325 mg by mouth daily.     . lansoprazole (PREVACID) 30 MG capsule Take 30 mg by mouth daily.    . Multiple Vitamin (MULTIVITAMIN) tablet Take 1 tablet by mouth daily.      . nitroGLYCERIN (NITROSTAT) 0.4 MG SL tablet Place 0.4 mg under the tongue every 5 (five) minutes as needed.      . Omega-3 Fatty Acids (FISH OIL) 1000 MG CAPS Take 3 capsules by mouth daily.      . valsartan-hydrochlorothiazide (DIOVAN-HCT) 320-12.5 MG per tablet Take 1 tablet by mouth daily.       No current  facility-administered medications for this visit.    Allergies:   Review of patient's allergies indicates no known allergies.    Social History:  The patient  reports that he quit smoking about 10 years ago. His smoking use included Cigarettes. His smokeless tobacco use includes Chew. He reports that he does not drink alcohol or use illicit drugs.   Family History:  The patient's family history includes Heart attack (age of onset: 5) in his brother.    ROS: .   All other systems are reviewed and negative.Unless otherwise mentioned in H&P above.   PHYSICAL EXAM: VS:  There were no vitals taken for this visit. , BMI There is no weight on file to calculate BMI. GEN: Well nourished, well developed, in no acute distress HEENT: normal Neck: no JVD, right carotid bruit, or masses Cardiac: RRR; no murmurs, rubs, or gallops,no edema  Respiratory:  Clear to auscultation bilaterally, normal work of breathing GI: soft, nontender, nondistended, + BS. Obese. MS: no deformity or atrophy Skin: warm and dry, no rash Neuro:  Strength and sensation are intact Psych: euthymic mood, full affect   EKG:  The ekg ordered today demonstrates NSR QTc 387. Rate of 70 bpm.    Recent Labs: No results found for requested labs within last 365 days.    Lipid Panel    Component Value Date/Time   CHOL 187 12/21/2010 0809   TRIG 73 12/21/2010 0809   HDL 54 12/21/2010 0809   CHOLHDL 3.5 12/21/2010 0809   VLDL 15 12/21/2010 0809   LDLCALC 118* 12/21/2010 0809      Wt Readings from Last 3 Encounters:  03/06/14 241 lb (109.317 kg)  01/23/13 233 lb (105.688 kg)  11/29/12 225 lb (102.059 kg)      Other studies Reviewed: Additional studies/ records that were reviewed today include: Cardiac cath 2011 Review of the above records demonstrates: As above   ASSESSMENT AND PLAN:  1.  CAD: Known history of multivessel disease with stent to the right coronary artery in the proximal portion, and in the  first diagonal.  This was completed in 2011.  The patient is now having recurrent symptoms of chest pain, fatigue, shortness of breath, and diaphoresis.  This occasionally happens at rest, but always with exertion.  The patient has been recently diagnosed with diabetes.  I have discussed this with the patient and also with Dr. Harrington Challenger concerning the need to proceed with a repeat cardiac catheterization.she agrees that this would need to be completed with his symptoms. He did have a exercise Myoview, which was completed in 2012 which did not show any reversible ischemia.  However, it was essential normal stress test with  attenuation noted and mild inferior wall ischemia could not be ruled out.  It is recommended that the patient proceed with cardiac catheterization.  Risks, benefits, and procedure have been discussed with the patient to include bleeding, infection, MI, and death.  He verbalizes understanding.  He has had caths before and does want to proceed.he is scheduled with Dr. Emelda Brothers on Wednesday June 22,2016. He has been advised to hold his metformin at morning and will likely need to hold it for the next 24 hours.  He verbalizes understanding.he is not on a statin, stating that he has severe myalgias associated with statin use.  This may need to be readdressed post catheterization.  2. Hypertension: currently well-controlled on ARB, and beta blocker.  3.Restless Legs: We will begin magnesium 400 mg daily for symptomatic relief. He is on a a PPI, which could also be contributing to hypomagnesemia.magnesium level will be added to his pre-cath labs.  4. Diabetes: Recently diagnosed by primary care physician.  Current medicines are reviewed at length with the patient today.    Labs/ tests ordered today include: Pre-Cath labs. No orders of the defined types were placed in this encounter.     Disposition:   FU with post cardiac cath in [place] or [place]. He is seen by Dr. Domenic Polite in  [place], Dwayne Sims, NP  12/09/2014 7:41 AM    [facility]. [address] [place], [place] [address] Phone: [phone]; Fax: [phone]

## 2014-12-11 NOTE — Interval H&P Note (Signed)
History and Physical Interval Note:  12/11/2014 9:33 AM  Dwayne Garrett  has presented today for cardiac cath with the diagnosis of cad/unstable angina  The various methods of treatment have been discussed with the patient and family. After consideration of risks, benefits and other options for treatment, the patient has consented to  Procedure(s): Left Heart Cath and Coronary Angiography (N/A) as a surgical intervention .  The patient's history has been reviewed, patient examined, no change in status, stable for surgery.  I have reviewed the patient's chart and labs.  Questions were answered to the patient's satisfaction.    Cath Lab Visit (complete for each Cath Lab visit)  Clinical Evaluation Leading to the Procedure:   ACS: No.  Non-ACS:    Anginal Classification: CCS III  Anti-ischemic medical therapy: Minimal Therapy (1 class of medications)  Non-Invasive Test Results: No non-invasive testing performed  Prior CABG: No previous CABG       Dwayne Garrett

## 2014-12-11 NOTE — Discharge Instructions (Signed)
Radial Site Care Refer to this sheet in the next few weeks. These instructions provide you with information on caring for yourself after your procedure. Your caregiver may also give you more specific instructions. Your treatment has been planned according to current medical practices, but problems sometimes occur. Call your caregiver if you have any problems or questions after your procedure. HOME CARE INSTRUCTIONS  You may shower the day after the procedure.Remove the bandage (dressing) and gently wash the site with plain soap and water.Gently pat the site dry.  Do not apply powder or lotion to the site.  Do not submerge the affected site in water for 3 to 5 days.  Inspect the site at least twice daily.  Do not flex or bend the affected arm for 24 hours.  No lifting over 5 pounds (2.3 kg) for 5 days after your procedure.  Do not drive home if you are discharged the same day of the procedure. Have someone else drive you.  You may drive 24 hours after the procedure unless otherwise instructed by your caregiver.  Do not operate machinery or power tools for 24 hours.  A responsible adult should be with you for the first 24 hours after you arrive home. What to expect:  Any bruising will usually fade within 1 to 2 weeks.  Blood that collects in the tissue (hematoma) may be painful to the touch. It should usually decrease in size and tenderness within 1 to 2 weeks. SEEK IMMEDIATE MEDICAL CARE IF:  You have unusual pain at the radial site.  You have redness, warmth, swelling, or pain at the radial site.  You have drainage (other than a small amount of blood on the dressing).  You have chills.  You have a fever or persistent symptoms for more than 72 hours.  You have a fever and your symptoms suddenly get worse.  Your arm becomes pale, cool, tingly, or numb.  You have heavy bleeding from the site. Hold pressure on the site and call 911. Document Released: 07/10/2010 Document  Revised: 08/30/2011 Document Reviewed: 07/10/2010 [facility] Patient Information 2015 [place], [place]. This information is not intended to replace advice given to you by your health care provider. Make sure you discuss any questions you have with your health care provider.                       Return To Work __[name]__ was treated at our facility. INJURY OR ILLNESS WAS: _____ Work-related __X___ Not work-related _____ Undetermined if work-related RETURN TO WORK  Employee may return to work on: __Tuesday 6/28/2016_  Employee may return to modified work on: __Friday 6/24/2016_ [place] Work activities not tolerated include: _____ Bending _____ Prolonged sitting __X___ Lifting (no more than 5 pounds) _____ Squatting _____ Prolonged standing _____ Dwayne Garrett _____ Reaching ___X__ Pushing and pulling _____ Walking _____ Other ____________________ Show this Return to Work statement to Optician, dispensing at work as soon as possible. Your employer should be aware of your condition and can help with the necessary work activity restrictions. If you wish to return to work sooner than the date above, or if you have further problems which make it difficult for you to return at that time, please call us or your caregiver. _Dr. [name]____ Physician Name (Printed) _________________________________________ Physician Signature  _06/22/2016__ Date Document Released: 06/07/2005 Document Revised: 08/30/2011 Document Reviewed: 11/22/2006 [employer] Patient Information 2015 [place], [place]. This information is not intended to replace advice given to you  by your health care provider. Make sure you discuss any questions you have with your health care provider.

## 2014-12-12 MED FILL — Heparin Sodium (Porcine) 2 Unit/ML in Sodium Chloride 0.9%: INTRAMUSCULAR | Qty: 1500 | Status: AC

## 2014-12-30 ENCOUNTER — Ambulatory Visit: Payer: PRIVATE HEALTH INSURANCE | Admitting: Cardiology

## 2015-07-31 ENCOUNTER — Encounter: Payer: Self-pay | Admitting: Cardiology

## 2015-07-31 ENCOUNTER — Encounter: Payer: Self-pay | Admitting: *Deleted

## 2015-07-31 ENCOUNTER — Ambulatory Visit (INDEPENDENT_AMBULATORY_CARE_PROVIDER_SITE_OTHER): Payer: PRIVATE HEALTH INSURANCE | Admitting: Cardiology

## 2015-07-31 VITALS — BP 146/100 | HR 76 | Ht 69.0 in | Wt 230.0 lb

## 2015-07-31 DIAGNOSIS — I10 Essential (primary) hypertension: Secondary | ICD-10-CM | POA: Diagnosis not present

## 2015-07-31 DIAGNOSIS — R600 Localized edema: Secondary | ICD-10-CM | POA: Diagnosis not present

## 2015-07-31 DIAGNOSIS — I25119 Atherosclerotic heart disease of native coronary artery with unspecified angina pectoris: Secondary | ICD-10-CM

## 2015-07-31 DIAGNOSIS — K760 Fatty (change of) liver, not elsewhere classified: Secondary | ICD-10-CM | POA: Diagnosis not present

## 2015-07-31 DIAGNOSIS — E785 Hyperlipidemia, unspecified: Secondary | ICD-10-CM

## 2015-07-31 NOTE — Progress Notes (Signed)
Cardiology Office Note  Date: 07/31/2015   ID: Dwayne Garrett, DOB 26-May-1956, MRN [id number]  PCP: Monico Blitz, MD  Primary Cardiologist: Rozann Lesches, MD   Chief Complaint  Patient presents with  . Coronary Artery Disease    History of Present Illness: Dwayne Garrett is a 60 y.o. male last seen by Ms. Lawrence NP in June 2016 (I last saw him in 2015). He was referred for a cardiac catheterization performed by Dr. Angelena Form in June 2016 which revealed overall mild to moderate nonobstructive CAD with patent stent site within the proximal RCA as well as diagonal. Medical therapy was recommended.  He presents today for a routine visit. He tells me that he has been fatigued, does have occasional arm discomfort suggesting possible angina. Nitroglycerin is helpful at times. He does not report any accelerating pattern however and generally has NYHA class II dyspnea with typical activities. 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 He has been trying to do some walking on a home treadmill for exercise.  Reviewed his medications which are outlined below. Cardiac regimen includes aspirin, Toprol-XL, omega-3 supplements, valsartan HCTZ, and occasional use of Lasix. He also has as needed nitroglycerin. He reports compliance.  He continues to follow with Dr. Manuella Ghazi. States that he has had abnormal liver function tests and has been told he has fatty liver disease. He has seen Dr. Laural Golden in the past. He is trying to lose some weight but this is been difficult for him. Also trying to manage his glucose. He has a follow-up visit with Dr. Manuella Ghazi in the near future. I'm requesting records to review his lab work.  Blood pressure is elevated today. We addressed his medications, also diet and exercise.  Past Medical History  Diagnosis Date  . Coronary atherosclerosis of native coronary artery     Multivessel (moderate), DES RCA 4/11, LVEF 60-65%  . Essential hypertension,  benign   . 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 OSA (obstructive sleep apnea)     on CPAP  . Abnormal LFTs   . Castleman's disease   . Bilateral leg edema     Chronic    Past Surgical History  Procedure Laterality Date  . Skin graft      Finger  . Tumor removal      Abdominal  . Bone cyst excision      Cyst removal from neck  . Cardiac catheterization N/A 12/11/2014    Procedure: Left Heart Cath and Coronary Angiography;  Surgeon: Burnell Blanks, MD;  Location: [facility];  Service: Cardiovascular;  Laterality: N/A;    Current Outpatient Prescriptions  Medication Sig Dispense Refill  . aspirin 325 MG EC tablet Take 325 mg by mouth daily.     . empagliflozin (JARDIANCE) 10 MG TABS tablet Take 10 mg by mouth daily.    . furosemide (LASIX) 20 MG tablet Take 20 mg by mouth daily as needed for fluid or edema.     . lansoprazole (PREVACID) 30 MG capsule Take 30 mg by mouth daily.    . metFORMIN (GLUCOPHAGE) 500 MG tablet Take 500 mg by mouth 2 (two) times daily.  3  . metoprolol succinate (TOPROL-XL) 25 MG 24 hr tablet Take 25 mg by mouth daily.  2  . Multiple Vitamin (MULTIVITAMIN WITH MINERALS) TABS tablet Take 1 tablet by mouth daily.    . nitroGLYCERIN (NITROSTAT) 0.4 MG SL tablet Place 1 tablet (  0.4 mg total) under the tongue every 5 (five) minutes as needed. (Patient taking differently: Place 0.4 mg under the tongue every 5 (five) minutes as needed for chest pain. ) 25 tablet 6  . Omega-3 Fatty Acids (FISH OIL) 1200 MG CAPS Take 1,200 mg by mouth 3 (three) times daily.    Glory Rosebush VERIO test strip TEST DAILY E11.9  3  . valsartan-hydrochlorothiazide (DIOVAN-HCT) 320-12.5 MG per tablet Take 1 tablet by mouth daily.       No current facility-administered medications for this visit.   Allergies:  Review of patient's allergies indicates no known allergies.   Social History: The patient  reports that he quit smoking about 11 years ago. His smoking  use included Cigarettes. His smokeless tobacco use includes Chew. He reports that he does not drink alcohol or use illicit drugs.   ROS:  Please see the history of present illness. Otherwise, complete review of systems is positive for chronic left knee and right ankle discomfort.  All other systems are reviewed and negative.   Physical Exam: VS:  BP 146/100 mmHg  Pulse 76  Ht 5' 9"  (1.753 m)  Wt 230 lb (104.327 kg)  BMI 33.95 kg/m2  SpO2 93%, BMI Body mass index is 33.95 kg/(m^2).  Wt Readings from Last 3 Encounters:  07/31/15 230 lb (104.327 kg)  12/11/14 240 lb (108.863 kg)  12/09/14 240 lb (108.863 kg)    General: Overweight male, appears comfortable at rest. HEENT: Conjunctiva and lids normal, oropharynx clear. Neck: Supple, no elevated JVP or carotid bruits, no thyromegaly. Lungs: Clear to auscultation, nonlabored breathing at rest. Cardiac: Regular rate and rhythm, no S3 or significant systolic murmur, no pericardial rub. Abdomen: Soft, nontender, bowel sounds present, no guarding or rebound. Extremities: No pitting edema, distal pulses 2+. Skin: Warm and dry. Several tattoos. Musculoskeletal: No kyphosis. Neuropsychiatric: Alert and oriented x3, affect grossly appropriate.  ECG: I personally reviewed the prior tracing from 12/09/2014 which showed normal sinus rhythm.  Recent Labwork: 12/09/2014: BUN 22*; Creatinine, Ser 0.76; Hemoglobin 14.5; Platelets 205; Potassium 4.0; Sodium 139 12/11/2014: Magnesium 2.0   Other Studies Reviewed Today:  Cardiac catheterization 12/11/2014:  Prox RCA lesion, 10% stenosed. The lesion was previously treated with a drug-eluting stent greater than two years ago.  Dist RCA lesion, 20% stenosed.  Prox Cx lesion, 30% stenosed.  Ost LAD to Prox LAD lesion, 40% stenosed.  The left ventricular systolic function is normal.  Patent stent Diagonal  LM lesion, 40% stenosed.  1. Double vessel CAD with patent stent proximal RCA and patent  stent Diagonal 2. Mild to moderate left main stenosis, unchanged from last cath in 2011 3. Normal LV systolic function  Echocardiogram 09/29/2009: Study Conclusions  - Left ventricle: The cavity size was normal. Wall thickness was  increased in a pattern of mild LVH. Systolic function was normal.  The estimated ejection fraction was in the range of 60% to 65%. - Mitral valve: Mild regurgitation.  Assessment and Plan:  1. CAD status post previous stent placement to the RCA and diagonal, patent at angiography in June 2016 with otherwise mild to moderate atherosclerosis. He does report fatigue and intermittent angina symptoms. We have discussed diet and exercise, also potential modifications in his medications. He did not want to add any medications today. Would consider Norvasc or Imdur as a next step.  2. Essential hypertension, blood pressure is elevated today. He reports compliance with his medications. We discussed diet and weight loss. If trend does not  improve would consider addition of Norvasc or Imdur for concurrent management of angina as well.  3. History of leg edema, uses Lasix very infrequently. He does not report any significant problems recently.  4. Reported abnormal LFTs and history of fatty liver disease. He has follow-up pending with Dr. Manuella Ghazi. If LFTs remain elevated would recommend referral back to Dr. Laural Golden.  5. Patient continues on omega-3 supplements, reports good lipid control. We are requesting records to review lab work from Dr. Manuella Ghazi. He has not been on statin therapy.  Current medicines were reviewed with the patient today.  Disposition: FU with me in 6 months.   Signed, Satira Sark, MD, [facility] 07/31/2015 9:22 AM    [facility] [place], [place], [place] [address] Phone: [phone]; Fax: [phone]

## 2015-07-31 NOTE — Patient Instructions (Signed)
Your physician recommends that you continue on your current medications as directed. Please refer to the Current Medication list given to you today. Your physician recommends that you schedule a follow-up appointment in: 6 months. You will receive a reminder letter in the mail in about 4 months reminding you to call and schedule your appointment. If you don't receive this letter, please contact our office. 

## 2015-10-01 ENCOUNTER — Encounter (INDEPENDENT_AMBULATORY_CARE_PROVIDER_SITE_OTHER): Payer: Self-pay | Admitting: *Deleted

## 2015-10-20 ENCOUNTER — Ambulatory Visit (INDEPENDENT_AMBULATORY_CARE_PROVIDER_SITE_OTHER): Payer: PRIVATE HEALTH INSURANCE | Admitting: Internal Medicine

## 2015-10-20 ENCOUNTER — Encounter (INDEPENDENT_AMBULATORY_CARE_PROVIDER_SITE_OTHER): Payer: Self-pay

## 2015-10-20 ENCOUNTER — Encounter (INDEPENDENT_AMBULATORY_CARE_PROVIDER_SITE_OTHER): Payer: Self-pay | Admitting: Internal Medicine

## 2015-10-20 VITALS — BP 164/80 | HR 68 | Temp 98.0°F | Ht 69.0 in | Wt 234.5 lb

## 2015-10-20 DIAGNOSIS — R748 Abnormal levels of other serum enzymes: Secondary | ICD-10-CM

## 2015-10-20 NOTE — Progress Notes (Addendum)
Subjective:    Patient ID: Dwayne Garrett, male    DOB: 11-18-1955, 60 y.o.   MRN: [id number]  HPI Referred by Dr. Manuella Ghazi for elevated liver enzymes. He tells me he has a hx of elevated liver enzymes. Per [facility] records he had a liver biopsy in 2004. I will try to find those records. He does not drink. NO hx of IV drug abuse.  He does have multiple tattoos. Hx of same and has been seen by our office over 5 yrs ago. Recently underwent an US abdomen this month and I will try to locate that report.  Appetite is good. No weight loss.  Usually has a BM 3-4 days. No melena or BRRB.  Hx significant for cardiac stents.  Hx of Castleman's disease with surgery. Found on CT when he was planning to give his brother a kidney.  Per records Hepatitis B and C markers negative in 1990s.    04/29/2015 CT abdomen/pelvis with CM:  ABDOMEN . Liver: Stable 4 mm hypodense lesion arising in segment 6 of the liver. No new liver lesions. . Gallbladder/biliary: Within normal limits. . Spleen: Within normal limits. Unchanged 9 mm soft tissue attenuation lesion anterior to the spleen consistent with a splenule.       09/29/2015 ALP 64, AST 128, ALT 239. HA1C 6.4  Hepatic Function Latest Ref Rng 12/21/2010 09/29/2009 09/28/2009  Total Protein 6.0 - 8.3 g/dL 6.8 6.0 6.6  Albumin 3.5 - 5.2 g/dL 4.4 3.2(L) 3.6  AST 0 - 37 U/L 28 76(H) 75(H)  ALT 0 - 53 U/L 21 53 55(H)  Alk Phosphatase 39 - 117 U/L 51 42 46  Total Bilirubin 0.3 - 1.2 mg/dL 0.8 1.4(H) 1.4(H)  Bilirubin, Direct 0.0 - 0.3 mg/dL 0.2 - -     Review of Systems  Past Medical History  Diagnosis Date  . Coronary atherosclerosis of native coronary artery     Multivessel (moderate), DES RCA 4/11, LVEF 60-65%  . Essential hypertension, benign   . 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 OSA (obstructive sleep apnea)     on CPAP  . Abnormal LFTs   . Castleman's disease   . Bilateral leg edema     Chronic    Past Surgical History    Procedure Laterality Date  . Skin graft      Finger  . Tumor removal      Abdominal  . Bone cyst excision      Cyst removal from neck  . Cardiac catheterization N/A 12/11/2014    Procedure: Left Heart Cath and Coronary Angiography;  Surgeon: Burnell Blanks, MD;  Location: [facility];  Service: Cardiovascular;  Laterality: N/A;    No Known Allergies  Current Outpatient Prescriptions on File Prior to Visit  Medication Sig Dispense Refill  . aspirin 325 MG EC tablet Take 325 mg by mouth daily.     . furosemide (LASIX) 20 MG tablet Take 20 mg by mouth daily as needed for fluid or edema.     . lansoprazole (PREVACID) 30 MG capsule Take 30 mg by mouth daily.    . metFORMIN (GLUCOPHAGE) 500 MG tablet Take 1,000 mg by mouth 2 (two) times daily.   3  . metoprolol succinate (TOPROL-XL) 25 MG 24 hr tablet Take 25 mg by mouth daily.  2  . Multiple Vitamin (MULTIVITAMIN WITH MINERALS) TABS tablet Take 1 tablet by mouth daily.    . nitroGLYCERIN (NITROSTAT) 0.4 MG SL  tablet Place 1 tablet (0.4 mg total) under the tongue every 5 (five) minutes as needed. (Patient taking differently: Place 0.4 mg under the tongue every 5 (five) minutes as needed for chest pain. ) 25 tablet 6  . Omega-3 Fatty Acids (FISH OIL) 1200 MG CAPS Take 1,200 mg by mouth 3 (three) times daily. Takes 3 a day    . ONETOUCH VERIO test strip TEST DAILY E11.9  3  . valsartan-hydrochlorothiazide (DIOVAN-HCT) 320-12.5 MG per tablet Take 1 tablet by mouth daily.       No current facility-administered medications on file prior to visit.           Objective:   Physical ExamBlood pressure 164/80, pulse 68, temperature 98 F (36.7 C), height 5' 9"  (1.753 m), weight 234 lb 8 oz (106.369 kg). Alert and oriented. Skin warm and dry. Oral mucosa is moist.   . Sclera anicteric, conjunctivae is pink. Thyroid not enlarged. No cervical lymphadenopathy. Lungs clear. Heart regular rate and rhythm.  Abdomen is soft. Bowel sounds  are positive. No hepatomegaly. No abdominal masses felt. No tenderness.  No edema to lower extremities.          Assessment & Plan:  Elevated liver enzymes and hx of same. Will get [place] report from Dr. Trena Platt office Needs to diet and exercise. OV in 6 months.  Labs today, SMA, ANA, sedrate, Acute Hepatitis Panel.

## 2015-10-20 NOTE — Patient Instructions (Signed)
Blood work. OV in 6 months.

## 2015-10-21 LAB — ANTI-SMOOTH MUSCLE ANTIBODY, IGG

## 2015-10-21 LAB — HEPATITIS PANEL, ACUTE
HCV Ab: NEGATIVE
HEP B C IGM: NONREACTIVE
Hep A IgM: NONREACTIVE
Hepatitis B Surface Ag: NEGATIVE

## 2015-10-21 LAB — ANA: ANA: NEGATIVE

## 2015-10-21 LAB — AFP TUMOR MARKER: AFP TUMOR MARKER: 2.4 ng/mL (ref <6.1)

## 2015-10-21 LAB — SEDIMENTATION RATE: SED RATE: 10 mm/h (ref 0–20)

## 2015-11-04 ENCOUNTER — Encounter (INDEPENDENT_AMBULATORY_CARE_PROVIDER_SITE_OTHER): Payer: Self-pay | Admitting: Internal Medicine

## 2015-11-10 ENCOUNTER — Encounter (INDEPENDENT_AMBULATORY_CARE_PROVIDER_SITE_OTHER): Payer: Self-pay

## 2015-12-16 ENCOUNTER — Other Ambulatory Visit: Payer: Self-pay | Admitting: Adult Health

## 2016-01-26 ENCOUNTER — Encounter (INDEPENDENT_AMBULATORY_CARE_PROVIDER_SITE_OTHER): Payer: Self-pay | Admitting: Internal Medicine

## 2016-01-26 ENCOUNTER — Other Ambulatory Visit (INDEPENDENT_AMBULATORY_CARE_PROVIDER_SITE_OTHER): Payer: Self-pay | Admitting: *Deleted

## 2016-01-26 ENCOUNTER — Ambulatory Visit (INDEPENDENT_AMBULATORY_CARE_PROVIDER_SITE_OTHER): Payer: PRIVATE HEALTH INSURANCE | Admitting: Internal Medicine

## 2016-01-26 VITALS — BP 200/110 | HR 66 | Temp 97.7°F | Ht 69.0 in | Wt 240.6 lb

## 2016-01-26 DIAGNOSIS — R748 Abnormal levels of other serum enzymes: Secondary | ICD-10-CM

## 2016-01-26 NOTE — Patient Instructions (Signed)
Elevated blood pressure: Patient needs to go to the ED for his blood pressure.

## 2016-01-26 NOTE — Progress Notes (Addendum)
Subjective:    Patient ID: Dwayne Garrett, male    DOB: Aug 21, 1955, 60 y.o.   MRN: [id number]  HPIHere today for f/u of his elevated liver enzymes.  Last seen in May of this year for same.  enzymes. Per [facility] records he had a liver biopsy in 2004. I will try to find those records. He is not a drinker. Rarely drinks a beer. His appetite is good. No weight loss.  Usually has BM every 2 days.  No abdominal pain.  Liver enzymes were normal in 2012.  10/20/2015 AFP 2.4, Acute Hepatitis Panel negative, Sedrate 10, SMA negative, ANA negative.   10/28/2015 H and H 14.6 and 44.1, MCV 92, AST 154, ALT 293, ALP 54   01/07/2016 ALP 85, AST 297, ALT 445, total bili 1.1  09/29/2015 ALP 64, AST 128, ALT 239. CBC    Component Value Date/Time   WBC 6.2 12/09/2014 1640   RBC 4.63 12/09/2014 1640   HGB 14.5 12/09/2014 1640   HCT 42.8 12/09/2014 1640   PLT 205 12/09/2014 1640   MCV 92.4 12/09/2014 1640   MCH 31.3 12/09/2014 1640   MCHC 33.9 12/09/2014 1640   RDW 12.9 12/09/2014 1640   LYMPHSABS 2.3 12/09/2014 1640   MONOABS 0.8 12/09/2014 1640   EOSABS 0.2 12/09/2014 1640   BASOSABS 0.1 12/09/2014 1640     HPI Hx significant for cardiac stents in 2014.  Hx of Castleman's disease with surgery. Found on CT when he was planning to give his brother a kidney.  Per records Hepatitis B and C markers negative in 1990s.    04/29/2015 CT abdomen/pelvis with CM:  ABDOMEN . Liver: Stable 4 mm hypodense lesion arising in segment 6 of the liver. No new liver lesions. . Gallbladder/biliary: Within normal limits. . Spleen: Within normal limits. Unchanged 9 mm soft tissue attenuation lesion anterior to the spleen consistent with a splenule.       Review of Systems Past Medical History:  Diagnosis Date  . Abnormal LFTs   . Bilateral leg edema    Chronic  . Castleman's disease   . Coronary atherosclerosis of native coronary artery    Multivessel (moderate), DES RCA 4/11, LVEF 60-65%    . Essential hypertension, benign   . Inferior myocardial infarction ([place])    09/2009  . OSA (obstructive sleep apnea)    on CPAP    Past Surgical History:  Procedure Laterality Date  . BONE CYST EXCISION     Cyst removal from neck  . CARDIAC CATHETERIZATION N/A 12/11/2014   Procedure: Left Heart Cath and Coronary Angiography;  Surgeon: Burnell Blanks, MD;  Location: [facility];  Service: Cardiovascular;  Laterality: N/A;  . SKIN GRAFT     Finger  . TUMOR REMOVAL     Abdominal    No Known Allergies  Current Outpatient Prescriptions on File Prior to Visit  Medication Sig Dispense Refill  . aspirin 325 MG EC tablet Take 325 mg by mouth daily.     . furosemide (LASIX) 20 MG tablet Take 20 mg by mouth daily as needed for fluid or edema.     . lansoprazole (PREVACID) 30 MG capsule Take 30 mg by mouth daily.    . metFORMIN (GLUCOPHAGE) 500 MG tablet Take 1,000 mg by mouth 2 (two) times daily.   3  . metoprolol succinate (TOPROL-XL) 25 MG 24 hr tablet Take 25 mg by mouth daily.  2  . Multiple Vitamin (MULTIVITAMIN WITH MINERALS) TABS  tablet Take 1 tablet by mouth daily.    Marland Kitchen NITROSTAT 0.4 MG SL tablet PLACE 1 TABLET UNDER TONGUE EVERY FIVE MINUTES AS DIRECTED 25 tablet 3  . Omega-3 Fatty Acids (FISH OIL) 1200 MG CAPS Take 1,200 mg by mouth 3 (three) times daily. Takes 3 a day    . ONETOUCH VERIO test strip TEST DAILY E11.9  3  . valsartan-hydrochlorothiazide (DIOVAN-HCT) 320-12.5 MG per tablet Take 1 tablet by mouth daily.       No current facility-administered medications on file prior to visit.        Objective:   Physical Exam Blood pressure (!) 200/110, pulse 66, temperature 97.7 F (36.5 C), height 5' 9"  (1.753 m), weight 240 lb 9.6 oz (109.1 kg).  Alert and oriented. Skin warm and dry. Oral mucosa is moist.   . Sclera anicteric, conjunctivae is pink. Thyroid not enlarged. No cervical lymphadenopathy. Lungs clear. Heart regular rate and rhythm.  Abdomen is soft.  Bowel sounds are positive. No hepatomegaly. No abdominal masses felt. No tenderness.  No edema to lower extremities.         Assessment & Plan:  Elevated liver enzymes. Am going to get and ceruloplasmin and ferritin  and will discuss with Dr. Laural Golden.  I advised to go the ED due to his elevated blood pressure.  May need a liver biopsy.

## 2016-01-27 LAB — FERRITIN: FERRITIN: 358 ng/mL (ref 20–380)

## 2016-01-28 ENCOUNTER — Telehealth (INDEPENDENT_AMBULATORY_CARE_PROVIDER_SITE_OTHER): Payer: Self-pay | Admitting: Internal Medicine

## 2016-01-28 DIAGNOSIS — K219 Gastro-esophageal reflux disease without esophagitis: Secondary | ICD-10-CM

## 2016-01-28 DIAGNOSIS — R748 Abnormal levels of other serum enzymes: Secondary | ICD-10-CM

## 2016-01-28 LAB — ALPHA-1-ANTITRYPSIN: A1 ANTITRYPSIN SER: 163 mg/dL (ref 83–199)

## 2016-01-28 LAB — CERULOPLASMIN: CERULOPLASMIN: 27 mg/dL (ref 18–36)

## 2016-01-28 MED ORDER — OMEPRAZOLE 40 MG PO CPDR: 40.0000 mg | DELAYED_RELEASE_CAPSULE | Freq: Every day | ORAL | 3 refills | Status: DC

## 2016-01-28 NOTE — Telephone Encounter (Signed)
Patient will stop the Prevacid. He will start Omeprazole 30m daily. Hepatic function today.  Has not been on a statin in 6 yrs.  Does not take Herbal medications.

## 2016-01-29 ENCOUNTER — Encounter (INDEPENDENT_AMBULATORY_CARE_PROVIDER_SITE_OTHER): Payer: Self-pay | Admitting: *Deleted

## 2016-01-29 ENCOUNTER — Other Ambulatory Visit (INDEPENDENT_AMBULATORY_CARE_PROVIDER_SITE_OTHER): Payer: Self-pay | Admitting: *Deleted

## 2016-01-29 DIAGNOSIS — R740 Nonspecific elevation of levels of transaminase and lactic acid dehydrogenase [LDH]: Principal | ICD-10-CM

## 2016-01-29 DIAGNOSIS — R7401 Elevation of levels of liver transaminase levels: Secondary | ICD-10-CM

## 2016-01-29 LAB — HEPATIC FUNCTION PANEL
ALBUMIN: 4.1 g/dL (ref 3.6–5.1)
ALK PHOS: 59 U/L (ref 40–115)
ALT: 317 U/L — ABNORMAL HIGH (ref 9–46)
AST: 208 U/L — ABNORMAL HIGH (ref 10–35)
BILIRUBIN TOTAL: 1 mg/dL (ref 0.2–1.2)
Bilirubin, Direct: 0.2 mg/dL (ref <=0.2)
Indirect Bilirubin: 0.8 mg/dL (ref 0.2–1.2)
TOTAL PROTEIN: 6.8 g/dL (ref 6.1–8.1)

## 2016-02-03 NOTE — Progress Notes (Signed)
Cardiology Office Note  Date: 02/04/2016   ID: DAHL HIGINBOTHAM, DOB [birth year], MRN [id number]  PCP: Monico Blitz, MD  Primary Cardiologist: Rozann Lesches, MD   Chief Complaint  Patient presents with  . Coronary Artery Disease    History of Present Illness: Dwayne Garrett is a 60 y.o. male last seen in February. He presents for a routine follow-up visit. He has had no angina symptoms or nitroglycerin use. Continues to work full time at [employer] in [place].Does report fatigue at the end of the day.  He follows with Dr. Laural Golden for assessment of abnormal LFTs. I reviewed recent lab work as outlined below. No abdominal pain or skin changes. He has a visit with GI later this month.  We went over his cardiac medications which are stable. Blood pressure has not been well controlled. On current regimen.  I reviewed his ECG today which shows normal sinus rhythm.  Past Medical History:  Diagnosis Date  . Abnormal LFTs   . Bilateral leg edema    Chronic  . Castleman's disease   . Coronary atherosclerosis of native coronary artery    Multivessel (moderate), DES RCA 4/11, LVEF 60-65%  . Essential hypertension, benign   . 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 OSA (obstructive sleep apnea)    on CPAP    Past Surgical History:  Procedure Laterality Date  . BONE CYST EXCISION     Cyst removal from neck  . CARDIAC CATHETERIZATION N/A 12/11/2014   Procedure: Left Heart Cath and Coronary Angiography;  Surgeon: Burnell Blanks, MD;  Location: [place] [facility];  Service: Cardiovascular;  Laterality: N/A;  . SKIN GRAFT     Finger  . TUMOR REMOVAL     Abdominal    Current Outpatient Prescriptions  Medication Sig Dispense Refill  . aspirin 325 MG EC tablet Take 325 mg by mouth daily.     . lansoprazole (PREVACID) 30 MG capsule Take 30 mg by mouth daily.    . metFORMIN (GLUCOPHAGE) 500 MG tablet Take 1,000 mg by mouth 2 (two) times daily.   3  . metoprolol  succinate (TOPROL-XL) 25 MG 24 hr tablet Take 25 mg by mouth daily.  2  . Multiple Vitamin (MULTIVITAMIN WITH MINERALS) TABS tablet Take 1 tablet by mouth daily.    Marland Kitchen NITROSTAT 0.4 MG SL tablet PLACE 1 TABLET UNDER TONGUE EVERY FIVE MINUTES AS DIRECTED 25 tablet 3  . Omega-3 Fatty Acids (FISH OIL) 1200 MG CAPS Take 1,200 mg by mouth 3 (three) times daily. Takes 3 a day    . omeprazole (PRILOSEC) 40 MG capsule Take 1 capsule (40 mg total) by mouth daily. 90 capsule 3  . ONETOUCH VERIO test strip TEST DAILY E11.9  3  . valsartan-hydrochlorothiazide (DIOVAN-HCT) 320-12.5 MG per tablet Take 1 tablet by mouth daily.      Marland Kitchen amLODipine (NORVASC) 5 MG tablet Take 1 tablet (5 mg total) by mouth daily. 30 tablet 6   No current facility-administered medications for this visit.    Allergies:  Review of patient's allergies indicates no known allergies.   Social History: The patient  reports that he quit smoking about 11 years ago. His smoking use included Cigarettes. He has quit using smokeless tobacco. His smokeless tobacco use included Chew. He reports that he does not drink alcohol or use drugs.   ROS:  Please see the history of present illness. Otherwise, complete review of systems is positive for arthritic stiffness.  All other systems are reviewed and negative.   Physical Exam: VS:  BP (!) 170/110   Pulse 77   Ht 5' 9"  (1.753 m)   Wt 241 lb (109.3 kg)   SpO2 97%   BMI 35.59 kg/m , BMI Body mass index is 35.59 kg/m.  Wt Readings from Last 3 Encounters:  02/04/16 241 lb (109.3 kg)  01/26/16 240 lb 9.6 oz (109.1 kg)  10/20/15 234 lb 8 oz (106.4 kg)    General: Overweight male, appears comfortable at rest. HEENT: Conjunctiva and lids normal, oropharynx clear. Neck: Supple, no elevated JVP or carotid bruits, no thyromegaly. Lungs: Clear to auscultation, nonlabored breathing at rest. Cardiac: Regular rate and rhythm, no S3 or significant systolic murmur, no pericardial rub. Abdomen: Soft,  nontender, bowel sounds present, no guarding or rebound. Extremities: No pitting edema, distal pulses 2+. Skin: Warm and dry. Several tattoos. Musculoskeletal: No kyphosis. Neuropsychiatric: Alert and oriented x3, affect grossly appropriate.  ECG: I personally reviewed the tracing from 12/09/2014 which showed normal sinus rhythm.  Recent Labwork:  May 2017: Cholesterol 238, triglycerides 120, HDL 42, LDL 162, BUN 18, creatinine 0.7, potassium 4.4, TSH 4.0, AST 154, ALT 293, hemoglobin 14.6, platelets 242 July 2017: BUN 17, creatinine 0.9, potassium 4.9, AST 297, ALT 445, hemoglobin 15.6, platelets 245  Other Studies Reviewed Today:  Cardiac catheterization 12/11/2014:  Prox RCA lesion, 10% stenosed. The lesion was previously treated with a drug-eluting stent greater than two years ago.  Dist RCA lesion, 20% stenosed.  Prox Cx lesion, 30% stenosed.  Ost LAD to Prox LAD lesion, 40% stenosed.  The left ventricular systolic function is normal.  Patent stent Diagonal  LM lesion, 40% stenosed.  1. Double vessel CAD with patent stent proximal RCA and patent stent Diagonal 2. Mild to moderate left main stenosis, unchanged from last cath in 2011 3. Normal LV systolic function  Echocardiogram 09/29/2009: Study Conclusions  - Left ventricle: The cavity size was normal. Wall thickness was  increased in a pattern of mild LVH. Systolic function was normal.  The estimated ejection fraction was in the range of 60% to 65%. - Mitral valve: Mild regurgitation.  Assessment and Plan:  1. CAD status post prior stent placement to the RCA and diagonal, angiography from last year showed patent stent sites with otherwise mild to moderate atherosclerosis. We are continuing medical therapy and observation for now. ECG reviewed and stable.  2. Essential hypertension, blood pressure is poorly controlled at this time. We are adding Norvasc 5 mg daily to current regimen.  3. Abnormal LFTs, keep  follow-up with Dr. Laural Golden for further workup and management. He is not on statin therapy at this time.  4. Hyperlipidemia, recent LDL 162. Currently not on statin therapy with had normal LFTs. He is on omega-3 supplements.  Current medicines were reviewed with the patient today.   Orders Placed This Encounter  Procedures  . EKG 12-Lead    Disposition: Follow-up with me in 6 months.  Signed, Satira Sark, MD, [facility] 02/04/2016 11:33 AM    [facility] [place], [place], [place] [address] Phone: [phone]; Fax: [phone]

## 2016-02-04 ENCOUNTER — Encounter: Payer: Self-pay | Admitting: *Deleted

## 2016-02-04 ENCOUNTER — Encounter: Payer: Self-pay | Admitting: Cardiology

## 2016-02-04 ENCOUNTER — Ambulatory Visit (INDEPENDENT_AMBULATORY_CARE_PROVIDER_SITE_OTHER): Payer: PRIVATE HEALTH INSURANCE | Admitting: Cardiology

## 2016-02-04 VITALS — BP 170/110 | HR 77 | Ht 69.0 in | Wt 241.0 lb

## 2016-02-04 DIAGNOSIS — K760 Fatty (change of) liver, not elsewhere classified: Secondary | ICD-10-CM | POA: Diagnosis not present

## 2016-02-04 DIAGNOSIS — E782 Mixed hyperlipidemia: Secondary | ICD-10-CM

## 2016-02-04 DIAGNOSIS — I10 Essential (primary) hypertension: Secondary | ICD-10-CM

## 2016-02-04 DIAGNOSIS — I2510 Atherosclerotic heart disease of native coronary artery without angina pectoris: Secondary | ICD-10-CM

## 2016-02-04 MED ORDER — AMLODIPINE BESYLATE 5 MG PO TABS: 5.0000 mg | ORAL_TABLET | Freq: Every day | ORAL | 6 refills | Status: DC

## 2016-02-04 NOTE — Patient Instructions (Signed)
Medication Instructions:   Begin Norvasc 1m daily.  Continue all other medications.    Labwork: none  Testing/Procedures: none  Follow-Up: Your physician wants you to follow up in: 6 months.  You will receive a reminder letter in the mail one-two months in advance.  If you don't receive a letter, please call our office to schedule the follow up appointment   Any Other Special Instructions Will Be Listed Below (If Applicable).  If you need a refill on your cardiac medications before your next appointment, please call your pharmacy.

## 2016-02-05 ENCOUNTER — Encounter (INDEPENDENT_AMBULATORY_CARE_PROVIDER_SITE_OTHER): Payer: Self-pay

## 2016-02-06 ENCOUNTER — Encounter (INDEPENDENT_AMBULATORY_CARE_PROVIDER_SITE_OTHER): Payer: Self-pay

## 2016-02-13 LAB — HEPATIC FUNCTION PANEL
ALT: 240 U/L — AB (ref 9–46)
AST: 138 U/L — ABNORMAL HIGH (ref 10–35)
Albumin: 4.4 g/dL (ref 3.6–5.1)
Alkaline Phosphatase: 58 U/L (ref 40–115)
BILIRUBIN DIRECT: 0.2 mg/dL (ref <=0.2)
BILIRUBIN INDIRECT: 0.8 mg/dL (ref 0.2–1.2)
Total Bilirubin: 1 mg/dL (ref 0.2–1.2)
Total Protein: 7.2 g/dL (ref 6.1–8.1)

## 2016-02-18 ENCOUNTER — Other Ambulatory Visit (INDEPENDENT_AMBULATORY_CARE_PROVIDER_SITE_OTHER): Payer: Self-pay | Admitting: *Deleted

## 2016-02-18 DIAGNOSIS — R740 Nonspecific elevation of levels of transaminase and lactic acid dehydrogenase [LDH]: Principal | ICD-10-CM

## 2016-02-18 DIAGNOSIS — R7401 Elevation of levels of liver transaminase levels: Secondary | ICD-10-CM

## 2016-03-15 ENCOUNTER — Encounter (INDEPENDENT_AMBULATORY_CARE_PROVIDER_SITE_OTHER): Payer: Self-pay | Admitting: *Deleted

## 2016-03-15 ENCOUNTER — Other Ambulatory Visit (INDEPENDENT_AMBULATORY_CARE_PROVIDER_SITE_OTHER): Payer: Self-pay | Admitting: *Deleted

## 2016-03-15 DIAGNOSIS — R740 Nonspecific elevation of levels of transaminase and lactic acid dehydrogenase [LDH]: Principal | ICD-10-CM

## 2016-03-15 DIAGNOSIS — R7401 Elevation of levels of liver transaminase levels: Secondary | ICD-10-CM

## 2016-03-29 ENCOUNTER — Other Ambulatory Visit: Payer: Self-pay | Admitting: *Deleted

## 2016-03-29 MED ORDER — AMLODIPINE BESYLATE 5 MG PO TABS: 5.0000 mg | ORAL_TABLET | Freq: Every day | ORAL | 3 refills | Status: DC

## 2016-04-02 LAB — HEPATIC FUNCTION PANEL
ALBUMIN: 4.2 g/dL (ref 3.6–5.1)
ALT: 321 U/L — ABNORMAL HIGH (ref 9–46)
AST: 216 U/L — ABNORMAL HIGH (ref 10–35)
Alkaline Phosphatase: 68 U/L (ref 40–115)
Bilirubin, Direct: 0.2 mg/dL (ref <=0.2)
Indirect Bilirubin: 0.7 mg/dL (ref 0.2–1.2)
TOTAL PROTEIN: 7.3 g/dL (ref 6.1–8.1)
Total Bilirubin: 0.9 mg/dL (ref 0.2–1.2)

## 2016-04-14 ENCOUNTER — Telehealth (INDEPENDENT_AMBULATORY_CARE_PROVIDER_SITE_OTHER): Payer: Self-pay | Admitting: Internal Medicine

## 2016-04-14 NOTE — Telephone Encounter (Signed)
Dwayne Garrett, this patient had a liver biopsy at [facility] ? 2004. Do you think you can find it.

## 2016-04-15 NOTE — Telephone Encounter (Signed)
Request has been faxed.

## 2016-04-27 ENCOUNTER — Telehealth (INDEPENDENT_AMBULATORY_CARE_PROVIDER_SITE_OTHER): Payer: Self-pay | Admitting: Internal Medicine

## 2016-04-27 DIAGNOSIS — R748 Abnormal levels of other serum enzymes: Secondary | ICD-10-CM

## 2016-04-27 NOTE — Telephone Encounter (Signed)
Ann, US abdomen for elevated liver enzymes. I have not spoken to patient.  I left a message.

## 2016-04-27 NOTE — Telephone Encounter (Signed)
[place] sch'd 05/03/16 at [phone] npo after midnight, patient aware

## 2016-04-28 ENCOUNTER — Telehealth (INDEPENDENT_AMBULATORY_CARE_PROVIDER_SITE_OTHER): Payer: Self-pay | Admitting: Internal Medicine

## 2016-04-28 LAB — HEPATIC FUNCTION PANEL
ALK PHOS: 66 U/L (ref 40–115)
ALT: 206 U/L — ABNORMAL HIGH (ref 9–46)
AST: 138 U/L — ABNORMAL HIGH (ref 10–35)
Albumin: 4.3 g/dL (ref 3.6–5.1)
BILIRUBIN DIRECT: 0.2 mg/dL (ref <=0.2)
BILIRUBIN INDIRECT: 0.6 mg/dL (ref 0.2–1.2)
Total Bilirubin: 0.8 mg/dL (ref 0.2–1.2)
Total Protein: 7 g/dL (ref 6.1–8.1)

## 2016-05-03 ENCOUNTER — Telehealth (INDEPENDENT_AMBULATORY_CARE_PROVIDER_SITE_OTHER): Payer: Self-pay | Admitting: Internal Medicine

## 2016-05-03 ENCOUNTER — Ambulatory Visit (HOSPITAL_COMMUNITY)
Admission: RE | Admit: 2016-05-03 | Discharge: 2016-05-03 | Disposition: A | Discharge status: Home / Self Care | Payer: PRIVATE HEALTH INSURANCE | Source: Ambulatory Visit | Attending: Internal Medicine | Admitting: Internal Medicine

## 2016-05-03 DIAGNOSIS — N281 Cyst of kidney, acquired: Secondary | ICD-10-CM | POA: Insufficient documentation

## 2016-05-03 DIAGNOSIS — R748 Abnormal levels of other serum enzymes: Secondary | ICD-10-CM | POA: Diagnosis present

## 2016-05-03 DIAGNOSIS — K760 Fatty (change of) liver, not elsewhere classified: Secondary | ICD-10-CM | POA: Diagnosis not present

## 2016-05-03 NOTE — Telephone Encounter (Signed)
Liver biopsy ordered.

## 2016-05-03 NOTE — Telephone Encounter (Signed)
US biopsy of liver ordered.

## 2016-05-06 ENCOUNTER — Encounter (INDEPENDENT_AMBULATORY_CARE_PROVIDER_SITE_OTHER): Payer: Self-pay

## 2016-05-10 ENCOUNTER — Other Ambulatory Visit: Payer: Self-pay | Admitting: Interventional Radiology

## 2016-05-10 ENCOUNTER — Other Ambulatory Visit: Payer: Self-pay | Admitting: Radiology

## 2016-05-11 ENCOUNTER — Ambulatory Visit (HOSPITAL_COMMUNITY)
Admission: RE | Admit: 2016-05-11 | Discharge: 2016-05-11 | Disposition: A | Discharge status: Home / Self Care | Payer: PRIVATE HEALTH INSURANCE | Source: Ambulatory Visit | Attending: Internal Medicine | Admitting: Internal Medicine

## 2016-05-11 ENCOUNTER — Encounter (HOSPITAL_COMMUNITY): Payer: Self-pay

## 2016-05-11 DIAGNOSIS — Z87891 Personal history of nicotine dependence: Secondary | ICD-10-CM | POA: Insufficient documentation

## 2016-05-11 DIAGNOSIS — I2510 Atherosclerotic heart disease of native coronary artery without angina pectoris: Secondary | ICD-10-CM | POA: Diagnosis not present

## 2016-05-11 DIAGNOSIS — N281 Cyst of kidney, acquired: Secondary | ICD-10-CM | POA: Diagnosis not present

## 2016-05-11 DIAGNOSIS — K7581 Nonalcoholic steatohepatitis (NASH): Secondary | ICD-10-CM | POA: Diagnosis not present

## 2016-05-11 DIAGNOSIS — G4733 Obstructive sleep apnea (adult) (pediatric): Secondary | ICD-10-CM | POA: Insufficient documentation

## 2016-05-11 DIAGNOSIS — R748 Abnormal levels of other serum enzymes: Secondary | ICD-10-CM | POA: Diagnosis present

## 2016-05-11 DIAGNOSIS — R600 Localized edema: Secondary | ICD-10-CM | POA: Diagnosis not present

## 2016-05-11 DIAGNOSIS — D47Z2 Castleman disease: Secondary | ICD-10-CM | POA: Diagnosis not present

## 2016-05-11 DIAGNOSIS — I10 Essential (primary) hypertension: Secondary | ICD-10-CM | POA: Insufficient documentation

## 2016-05-11 DIAGNOSIS — I252 Old myocardial infarction: Secondary | ICD-10-CM | POA: Insufficient documentation

## 2016-05-11 DIAGNOSIS — Z7982 Long term (current) use of aspirin: Secondary | ICD-10-CM | POA: Diagnosis not present

## 2016-05-11 LAB — CBC
HCT: 42.5 % (ref 39.0–52.0)
HEMOGLOBIN: 14.4 g/dL (ref 13.0–17.0)
MCH: 31.2 pg (ref 26.0–34.0)
MCHC: 33.9 g/dL (ref 30.0–36.0)
MCV: 92 fL (ref 78.0–100.0)
Platelets: 221 10*3/uL (ref 150–400)
RBC: 4.62 MIL/uL (ref 4.22–5.81)
RDW: 13.4 % (ref 11.5–15.5)
WBC: 7.5 10*3/uL (ref 4.0–10.5)

## 2016-05-11 LAB — GLUCOSE, CAPILLARY: Glucose-Capillary: 128 mg/dL — ABNORMAL HIGH (ref 65–99)

## 2016-05-11 LAB — PROTIME-INR
INR: 1.06
PROTHROMBIN TIME: 13.8 s (ref 11.4–15.2)

## 2016-05-11 LAB — APTT: APTT: 27 s (ref 24–36)

## 2016-05-11 MED ORDER — FENTANYL CITRATE (PF) 100 MCG/2ML IJ SOLN
INTRAMUSCULAR | Status: AC
  Filled 2016-05-11: qty 2

## 2016-05-11 MED ORDER — GELATIN ABSORBABLE 12-7 MM EX MISC
CUTANEOUS | Status: AC
  Filled 2016-05-11: qty 1

## 2016-05-11 MED ORDER — MIDAZOLAM HCL 2 MG/2ML IJ SOLN
INTRAMUSCULAR | Status: AC
  Filled 2016-05-11: qty 2

## 2016-05-11 MED ORDER — SODIUM CHLORIDE 0.9 % IV SOLN: INTRAVENOUS | Status: DC

## 2016-05-11 MED ORDER — LIDOCAINE HCL 1 % IJ SOLN
INTRAMUSCULAR | Status: AC
  Filled 2016-05-11: qty 20

## 2016-05-11 MED ORDER — MIDAZOLAM HCL 2 MG/2ML IJ SOLN
INTRAMUSCULAR | Status: AC | PRN
  Administered 2016-05-11: 2 mg via INTRAVENOUS

## 2016-05-11 MED ORDER — FENTANYL CITRATE (PF) 100 MCG/2ML IJ SOLN
INTRAMUSCULAR | Status: AC | PRN
  Administered 2016-05-11 (×2): 50 ug via INTRAVENOUS

## 2016-05-11 NOTE — Sedation Documentation (Signed)
Patient is resting comfortably. 

## 2016-05-11 NOTE — Discharge Instructions (Signed)
Liver Biopsy, Care After Introduction These instructions give you information on caring for yourself after your procedure. Your doctor may also give you more specific instructions. Call your doctor if you have any problems or questions after your procedure. Follow these instructions at home:  Rest at home for 1-2 days or as told by your doctor.  Have someone stay with you for at least 24 hours.  Do not do these things in the first 24 hours:  Drive.  Use machinery.  Take care of other people.  Sign legal documents.  Take a bath or shower.  There are many different ways to close and cover a cut (incision). For example, a cut can be closed with stitches, skin glue, or adhesive strips. Follow your doctor's instructions on:  Taking care of your cut.  Changing and removing your bandage (dressing).  Removing whatever was used to close your cut.  Do not drink alcohol in the first week.  Do not lift more than 5 pounds or play contact sports for the first 2 weeks.  Take medicines only as told by your doctor. For 1 week, do not take medicine that has aspirin in it or medicines like ibuprofen.  Get your test results. Contact a doctor if:  A cut bleeds and leaves more than just a small spot of blood.  A cut is red, puffs up (swells), or hurts more than before.  Fluid or something else comes from a cut.  A cut smells bad.  You have a fever or chills. Get help right away if:  You have swelling, bloating, or pain in your belly (abdomen).  You get dizzy or faint.  You have a rash.  You feel sick to your stomach (nauseous) or throw up (vomit).  You have trouble breathing, feel short of breath, or feel faint.  Your chest hurts.  You have problems talking or seeing.  You have trouble balancing or moving your arms or legs. This information is not intended to replace advice given to you by your health care provider. Make sure you discuss any questions you have with your  health care provider. Document Released: 03/16/2008 Document Revised: 11/13/2015 Document Reviewed: 08/03/2013  2017 Elsevier

## 2016-05-11 NOTE — H&P (Signed)
Chief Complaint: Patient was seen in consultation today for No chief complaint on file.  at the request of Setzer,Terri L  Referring Physician(s): Setzer,Terri L  Supervising Physician: Marybelle Killings  Patient Status: [facility] - Out-pt  History of Present Illness: Dwayne Garrett is a 60 y.o. male   Elevated liver enzymes Known since 2002  Pt was scheduled to donate kidney to brother at [facility] and was discovered to have abdominal mass Work up of this revealed fatty liver and elevated liver enzymes. Pt states he had a liver core biopsy at that time No records of result. Hep panel neg 10/2015 per notes  Follows with Dr Laural Golden and Allen Norris PA Request has been made for liver core biopsy to evaluate function  Scheduled now for today.   Past Medical History:  Diagnosis Date  . Abnormal LFTs   . Bilateral leg edema    Chronic  . Castleman's disease ([place])   . Coronary atherosclerosis of native coronary artery    Multivessel (moderate), DES RCA 4/11, LVEF 60-65%  . Essential hypertension, benign   . 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 OSA (obstructive sleep apnea)    on CPAP    Past Surgical History:  Procedure Laterality Date  . BONE CYST EXCISION     Cyst removal from neck  . CARDIAC CATHETERIZATION N/A 12/11/2014   Procedure: Left Heart Cath and Coronary Angiography;  Surgeon: Burnell Blanks, MD;  Location: [place] [facility];  Service: Cardiovascular;  Laterality: N/A;  . SKIN GRAFT     Finger  . TUMOR REMOVAL     Abdominal    Allergies: Patient has no known allergies.  Medications: Prior to Admission medications   Medication Sig Start Date End Date Taking? Authorizing Provider  amLODipine (NORVASC) 5 MG tablet Take 1 tablet (5 mg total) by mouth daily. 03/29/16 06/27/16 Yes Satira Sark, MD  aspirin 325 MG EC tablet Take 325 mg by mouth daily.    Yes Historical Provider, MD  metFORMIN (GLUCOPHAGE) 500 MG tablet Take 1,000 mg by  mouth 2 (two) times daily.  11/27/14  Yes Historical Provider, MD  metoprolol succinate (TOPROL-XL) 25 MG 24 hr tablet Take 25 mg by mouth daily. 10/25/14  Yes Historical Provider, MD  Multiple Vitamin (MULTIVITAMIN WITH MINERALS) TABS tablet Take 1 tablet by mouth daily.   Yes Historical Provider, MD  Omega-3 Fatty Acids (FISH OIL) 1000 MG CAPS Take 3,000 mg by mouth daily.   Yes Historical Provider, MD  omeprazole (PRILOSEC) 40 MG capsule Take 1 capsule (40 mg total) by mouth daily. 01/28/16  Yes Butch Penny, NP  sildenafil (REVATIO) 20 MG tablet Take 100 mg by mouth daily as needed (erectile dysfunction).  02/29/16  Yes Historical Provider, MD  valsartan-hydrochlorothiazide (DIOVAN-HCT) 320-12.5 MG per tablet Take 1 tablet by mouth daily.     Yes Historical Provider, MD  NITROSTAT 0.4 MG SL tablet PLACE 1 TABLET UNDER TONGUE EVERY FIVE MINUTES AS DIRECTED 12/16/15   Satira Sark, MD  [facility] VERIO test strip TEST DAILY E11.9 10/24/14   Historical Provider, MD     Family History  Problem Relation Age of Onset  . Heart attack Brother 52    Social History   Social History  . Marital status: Married    Spouse name: N/A  . Number of children: N/A  . Years of education: N/A   Occupational History  . Furniture conservator/restorer   .  [employer]  Social History Main Topics  . Smoking status: Former Smoker    Types: Cigarettes    Quit date: 07/01/2004  . 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 Alcohol use No  . Drug use: No  . Sexual activity: Not Asked   Other Topics Concern  . None   Social History Narrative  . None    Review of Systems: A 12 point ROS discussed and pertinent positives are indicated in the HPI above.  All other systems are negative.  Review of Systems  Constitutional: Negative for activity change, appetite change, fatigue and fever.  Respiratory: Negative for cough and shortness of breath.   Cardiovascular:  Negative for chest pain.  Gastrointestinal: Negative for abdominal pain.  Skin: Negative for color change.  Neurological: Negative for weakness.  Psychiatric/Behavioral: Negative for behavioral problems and confusion.    Vital Signs: BP (!) 141/95   Pulse 70   Temp 97.7 F (36.5 C)   Resp 18   Ht 5' 9"  (1.753 m)   Wt 241 lb (109.3 kg)   BMI 35.59 kg/m   Physical Exam  Constitutional: He is oriented to person, place, and time. He appears well-nourished.  HENT:  Head:    Facial rash in nasal folds; chin and mid forehead Reddened skin; minimally flakiness   Cardiovascular: Normal rate, regular rhythm and normal heart sounds.   Pulmonary/Chest: Effort normal and breath sounds normal.  Abdominal: Soft. Bowel sounds are normal.  Musculoskeletal: Normal range of motion.  Neurological: He is alert and oriented to person, place, and time.  Skin: Skin is warm and dry.  Psychiatric: He has a normal mood and affect. His behavior is normal. Judgment and thought content normal.  Nursing note and vitals reviewed.   Mallampati Score:  MD Evaluation Airway: WNL Heart: WNL Abdomen: WNL Chest/ Lungs: WNL ASA  Classification: 2 Mallampati/Airway Score: One  Imaging: US Abdomen Complete  Result Date: 05/03/2016 CLINICAL DATA:  Elevated liver enzymes EXAM: ABDOMEN ULTRASOUND COMPLETE COMPARISON:  None. FINDINGS: Gallbladder: No gallstones or wall thickening visualized. No sonographic Murphy sign noted by sonographer. Common bile duct: Diameter: 4 mm Liver: Echogenic with sparing around the gallbladder fossa. Negative for mass lesion. Antegrade flow in the imaged hepatic and portal venous system. IVC: No abnormality visualized. Pancreas: Visualized portion unremarkable. Spleen: Size and appearance within normal limits. Right Kidney: Length: 13 cm. Echogenicity within normal limits. No mass or hydronephrosis visualized. Left Kidney: Length: 13 cm. Echogenicity within normal limits. No mass  or hydronephrosis visualized. Two cysts measuring up to 22 mm Abdominal aorta: No aneurysm visualized. Other findings: None. IMPRESSION: 1. Hepatic steatosis. 2. 2 simple left renal cysts. Electronically Signed   By: Monte Fantasia M.D.   On: 05/03/2016 08:27    Labs:  CBC:  Recent Labs  05/11/16 1235  WBC 7.5  HGB 14.4  HCT 42.5  PLT 221    COAGS: No results for input(s): INR, APTT in the last 8760 hours.  BMP: No results for input(s): NA, K, CL, CO2, GLUCOSE, BUN, CALCIUM, CREATININE, GFRNONAA, GFRAA in the last 8760 hours.  Invalid input(s): CMP  LIVER FUNCTION TESTS:  Recent Labs  01/28/16 1553 02/12/16 1544 04/01/16 1304 04/27/16 1428  BILITOT 1.0 1.0 0.9 0.8  AST 208* 138* 216* 138*  ALT 317* 240* 321* 206*  ALKPHOS 59 58 68 66  PROT 6.8 7.2 7.3 7.0  ALBUMIN 4.1  4.4 4.2 4.3    TUMOR MARKERS:  Recent Labs  10/20/15 1605  AFPTM 2.4    Assessment and Plan:  Chronic elevated liver enzymes Follows with Dr Laural Golden Last bx 2002 Now for random liver core biopsy Risks and Benefits discussed with the patient including, but not limited to bleeding, infection, damage to adjacent structures or low yield requiring additional tests. All of the patient's questions were answered, patient is agreeable to proceed. Consent signed and in chart.    Thank you for this interesting consult.  I greatly enjoyed meeting BRENIN HEIDELBERGER and look forward to participating in their care.  A copy of this report was sent to the requesting provider on this date.  Electronically Signed: Tashawn Laswell A 05/11/2016, 12:57 PM   I spent a total of  30 Minutes   in face to face in clinical consultation, greater than 50% of which was counseling/coordinating care for random liver bx

## 2016-05-11 NOTE — Procedures (Signed)
Random liver Bx 18 g core times three No comp/EBL

## 2016-05-11 NOTE — Sedation Documentation (Signed)
Patient denies pain and is resting comfortably.  

## 2016-05-12 NOTE — Telephone Encounter (Signed)
err

## 2016-05-17 ENCOUNTER — Encounter (INDEPENDENT_AMBULATORY_CARE_PROVIDER_SITE_OTHER): Payer: Self-pay

## 2016-05-27 ENCOUNTER — Encounter (INDEPENDENT_AMBULATORY_CARE_PROVIDER_SITE_OTHER): Payer: Self-pay | Admitting: Internal Medicine

## 2016-07-28 ENCOUNTER — Ambulatory Visit (INDEPENDENT_AMBULATORY_CARE_PROVIDER_SITE_OTHER): Payer: PRIVATE HEALTH INSURANCE | Admitting: Internal Medicine

## 2016-07-28 ENCOUNTER — Other Ambulatory Visit (INDEPENDENT_AMBULATORY_CARE_PROVIDER_SITE_OTHER): Payer: Self-pay | Admitting: *Deleted

## 2016-07-28 ENCOUNTER — Encounter (INDEPENDENT_AMBULATORY_CARE_PROVIDER_SITE_OTHER): Payer: Self-pay | Admitting: Internal Medicine

## 2016-07-28 VITALS — BP 160/90 | HR 72 | Temp 97.1°F | Ht 69.0 in | Wt 241.6 lb

## 2016-07-28 DIAGNOSIS — K7469 Other cirrhosis of liver: Secondary | ICD-10-CM

## 2016-07-28 DIAGNOSIS — R7401 Elevation of levels of liver transaminase levels: Secondary | ICD-10-CM

## 2016-07-28 DIAGNOSIS — R748 Abnormal levels of other serum enzymes: Secondary | ICD-10-CM

## 2016-07-28 DIAGNOSIS — R740 Nonspecific elevation of levels of transaminase and lactic acid dehydrogenase [LDH]: Principal | ICD-10-CM

## 2016-07-28 NOTE — Patient Instructions (Addendum)
  Cmet in 3 months. OV in 6 months.

## 2016-07-28 NOTE — Progress Notes (Signed)
   Subjective:    Patient ID: Dwayne Garrett, male    DOB: [birth year], 61 y.o.   MRN: [id number]  HPI  Here today for f/u of his elevated liver enzymes. Known hx of liver enzymes as far back as the 1990s. His last weight in august was 240. Today his weight is 241.6 HIs appetite is good. No weight loss.  He is trying to exercise. He has been trying to walk on the tread mill.   He does not drink.   No melena or BRRB.  Hx significant for cardiac stents.  Hx of Castleman's disease with surgery. Found on CT when he was planning to give his brother a kidney.  Per records Hepatitis B and C markers negative in 1990s.  10/20/2015 ANA negative, SMA negative, Acute hepatitis panel negative., ceruloplasmin negative., alpha 1 antitrypsin negative.       05/11/2016 US Liver biopsy Steatohepatitis with features consistent with cirrhosis.   Hepatic Function Latest Ref Rng & Units 04/27/2016 04/01/2016 02/12/2016  Total Protein 6.1 - 8.1 g/dL 7.0 7.3 7.2  Albumin 3.6 - 5.1 g/dL 4.3 4.2 4.4  AST 10 - 35 U/L 138(H) 216(H) 138(H)  ALT 9 - 46 U/L 206(H) 321(H) 240(H)  Alk Phosphatase 40 - 115 U/L 66 68 58  Total Bilirubin 0.2 - 1.2 mg/dL 0.8 0.9 1.0  Bilirubin, Direct <=0.2 mg/dL 0.2 0.2 0.2    04/29/2015 CT abdomen/pelvis with CM:  ABDOMEN . Liver: Stable 4 mm hypodense lesion arising in segment 6 of the liver. No new liver lesions. . Gallbladder/biliary: Within normal limits. . Spleen: Within normal limits. Unchanged 9 mm soft tissue attenuation lesion anterior to the spleen consistent with a splenule.    Review of Systems     Objective:   Physical Exam Blood pressure (!) 160/90, pulse 72, temperature 97.1 F (36.2 C), height _0  (1.753 m), weight 241 lb 9.6 oz (109.6 kg). Alert and oriented. Skin warm and dry. Oral mucosa is moist.   . Sclera anicteric, conjunctivae is pink. Thyroid not enlarged. No cervical lymphadenopathy. Lungs clear. Heart regular rate and rhythm.  Abdomen is soft.  Bowel sounds are positive. No hepatomegaly. No abdominal masses felt. No tenderness.  No edema to lower extremities.       Assessment & Plan:  Elevated liver enzymes. Auto immune process ruled out. Hep B and C negative.Elevated liver  enzymes may be related to his hx of Castleman's disease.  Liver biopsy revealed cirrhosis.   Hepatic function in 4 months.  OV in 6 months.  Patinet needs to diet and exercise.

## 2016-08-02 NOTE — Progress Notes (Signed)
Cardiology Office Note  Date: 08/03/2016   ID: Dwayne Garrett, DOB March 17, 1956, MRN [id number]  PCP: Monico Blitz, MD  Primary Cardiologist: Rozann Lesches, MD   Chief Complaint  Patient presents with  . Coronary Artery Disease    History of Present Illness: Dwayne Garrett is a 61 y.o. male last seen in August 2017. He presents for a routine follow-up visit today. From a cardiac perspective he reports no angina symptoms, no palpitations or syncope. 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  He has had recent GI follow-up for steatohepatitis with features consistent with cirrhosis. Plans to see Dr. Laural Golden in the coming months. He is trying to work on diet and weight loss.  I reviewed his medications which are outlined below. Cardiac regimen includes aspirin, Norvasc, Toprol-XL, Diovan, and as needed nitroglycerin.  Cardiac catheterization from 2016 is outlined below.  He states that he is undergoing evaluation through [facility] with Dr. 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  Past Medical History:  Diagnosis Date  . Abnormal LFTs   . Bilateral leg edema    Chronic  . 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 Coronary atherosclerosis of native coronary artery    Multivessel (moderate), DES RCA 4/11, LVEF 60-65%  . Essential hypertension, benign   . 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 OSA (obstructive sleep apnea)    on CPAP    Past Surgical History:  Procedure Laterality Date  . BONE CYST EXCISION     Cyst removal from neck  . CARDIAC CATHETERIZATION N/A 12/11/2014   Procedure: Left Heart Cath and Coronary Angiography;  Surgeon: Burnell Blanks, MD;  Location: [facility];  Service: Cardiovascular;  Laterality: N/A;  . SKIN GRAFT     Finger  . TUMOR REMOVAL     Abdominal    Current Outpatient Prescriptions  Medication Sig Dispense Refill  . amLODipine (NORVASC) 5 MG tablet Take 1  tablet (5 mg total) by mouth daily. 90 tablet 3  . aspirin 325 MG EC tablet Take 325 mg by mouth daily.     . Insulin Degludec (TRESIBA FLEXTOUCH [place]) Inject 5 Units into the skin daily.    . metFORMIN (GLUCOPHAGE) 500 MG tablet Take 1,000 mg by mouth 2 (two) times daily.   3  . metoprolol succinate (TOPROL-XL) 25 MG 24 hr tablet Take 25 mg by mouth daily.  2  . Multiple Vitamin (MULTIVITAMIN WITH MINERALS) TABS tablet Take 1 tablet by mouth daily.    Marland Kitchen NITROSTAT 0.4 MG SL tablet PLACE 1 TABLET UNDER TONGUE EVERY FIVE MINUTES AS DIRECTED 25 tablet 3  . Omega-3 Fatty Acids (FISH OIL) 1000 MG CAPS Take 3,000 mg by mouth daily.    Marland Kitchen omeprazole (PRILOSEC) 40 MG capsule Take 1 capsule (40 mg total) by mouth daily. 90 capsule 3  . ONETOUCH VERIO test strip TEST DAILY E11.9  3  . sildenafil (REVATIO) 20 MG tablet Take 100 mg by mouth daily as needed (erectile dysfunction).   0  . valsartan-hydrochlorothiazide (DIOVAN-HCT) 320-12.5 MG per tablet Take 1 tablet by mouth daily.       No current facility-administered medications for this visit.    Allergies:  Patient has no known allergies.   Social History: The patient  reports that he quit smoking about 12 years ago. His smoking use included Cigarettes. His smokeless tobacco use includes Chew. He reports that he does not  drink alcohol or use drugs.   ROS:  Please see the history of present illness. Otherwise, complete review of systems is positive for none.  All other systems are reviewed and negative.   Physical Exam: VS:  BP (!) 158/92   Pulse 68   Ht 5' 9"  (1.753 m)   Wt 242 lb 3.2 oz (109.9 kg)   SpO2 96%   BMI 35.77 kg/m , BMI Body mass index is 35.77 kg/m.  Wt Readings from Last 3 Encounters:  08/03/16 242 lb 3.2 oz (109.9 kg)  07/28/16 241 lb 9.6 oz (109.6 kg)  05/11/16 241 lb (109.3 kg)    General: Overweight male, appears comfortable at rest. HEENT: Conjunctiva and lids normal, oropharynx clear. Neck: Supple, no elevated JVP or  carotid bruits, no thyromegaly. Lungs: Clear to auscultation, nonlabored breathing at rest. Cardiac: Regular rate and rhythm, no S3 or significant systolic murmur, no pericardial rub. Abdomen: Soft, nontender, bowel sounds present, no guarding or rebound. Extremities: No pitting edema, distal pulses 2+. Skin: Warm and dry. Several tattoos. Musculoskeletal: No kyphosis. Neuropsychiatric: Alert and oriented x3, affect grossly appropriate.  ECG: I personally reviewed the tracing from 02/04/2016 which shows normal sinus rhythm.  Recent Labwork: 04/27/2016: ALT 206; AST 138 05/11/2016: Hemoglobin 14.4; Platelets 221   Other Studies Reviewed Today:  Cardiac catheterization 12/11/2014:  Prox RCA lesion, 10% stenosed. The lesion was previously treated with a drug-eluting stent greater than two years ago.  Dist RCA lesion, 20% stenosed.  Prox Cx lesion, 30% stenosed.  Ost LAD to Prox LAD lesion, 40% stenosed.  The left ventricular systolic function is normal.  Patent stent Diagonal  LM lesion, 40% stenosed.   1. Double vessel CAD with patent stent proximal RCA and patent stent Diagonal 2. Mild to moderate left main stenosis, unchanged from last cath in 2011 3. Normal LV systolic function  Assessment and Plan:  1. Symptomatically stable CAD status post prior stent interventions to the RCA and diagonal, patent at cardiac catheterization within the last 2 years (June 2016). He reports no angina, palpitations, or syncope. Plan is to continue medical therapy and observation. 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  2. Essential hypertension, keep follow-up with Dr. Manuella Ghazi, no change in current regimen.  3. OSA on CPAP.  4. Reported steatohepatitis with cirrhosis, followed by Dr. Laural Golden. He is trying to work on weight loss.  Current medicines were reviewed with the  patient today.  Disposition: Follow-up in 6 months.  Signed, Satira Sark, MD, [facility] 08/03/2016 4:30 PM    [facility] [address], [place], [place] [address] Phone: [phone]; Fax: [phone]

## 2016-08-03 ENCOUNTER — Ambulatory Visit (INDEPENDENT_AMBULATORY_CARE_PROVIDER_SITE_OTHER): Payer: PRIVATE HEALTH INSURANCE | Admitting: Cardiology

## 2016-08-03 ENCOUNTER — Encounter: Payer: Self-pay | Admitting: Cardiology

## 2016-08-03 VITALS — BP 158/92 | HR 68 | Ht 69.0 in | Wt 242.2 lb

## 2016-08-03 DIAGNOSIS — K7581 Nonalcoholic steatohepatitis (NASH): Secondary | ICD-10-CM

## 2016-08-03 DIAGNOSIS — I2510 Atherosclerotic heart disease of native coronary artery without angina pectoris: Secondary | ICD-10-CM

## 2016-08-03 DIAGNOSIS — E782 Mixed hyperlipidemia: Secondary | ICD-10-CM | POA: Diagnosis not present

## 2016-08-03 DIAGNOSIS — I10 Essential (primary) hypertension: Secondary | ICD-10-CM

## 2016-08-03 NOTE — Patient Instructions (Signed)
Your physician wants you to follow-up in: 6 months with Dr. Domenic Polite. You will receive a reminder letter in the mail two months in advance. If you don't receive a letter, please call our office to schedule the follow-up appointment.   Your physician recommends that you continue on your current medications as directed. Please refer to the Current Medication list given to you today.    Thank you for choosing [facility]!!

## 2016-10-15 ENCOUNTER — Encounter (INDEPENDENT_AMBULATORY_CARE_PROVIDER_SITE_OTHER): Payer: Self-pay | Admitting: *Deleted

## 2016-10-15 ENCOUNTER — Other Ambulatory Visit (INDEPENDENT_AMBULATORY_CARE_PROVIDER_SITE_OTHER): Payer: Self-pay | Admitting: *Deleted

## 2016-10-15 DIAGNOSIS — R7401 Elevation of levels of liver transaminase levels: Secondary | ICD-10-CM

## 2016-10-15 DIAGNOSIS — R740 Nonspecific elevation of levels of transaminase and lactic acid dehydrogenase [LDH]: Principal | ICD-10-CM

## 2016-10-15 DIAGNOSIS — K7469 Other cirrhosis of liver: Secondary | ICD-10-CM

## 2016-10-18 ENCOUNTER — Other Ambulatory Visit (INDEPENDENT_AMBULATORY_CARE_PROVIDER_SITE_OTHER): Payer: Self-pay | Admitting: *Deleted

## 2016-10-18 DIAGNOSIS — R748 Abnormal levels of other serum enzymes: Secondary | ICD-10-CM

## 2016-10-26 LAB — COMPREHENSIVE METABOLIC PANEL
ALK PHOS: 53 U/L (ref 40–115)
ALT: 195 U/L — AB (ref 9–46)
AST: 130 U/L — AB (ref 10–35)
Albumin: 4.3 g/dL (ref 3.6–5.1)
BILIRUBIN TOTAL: 0.8 mg/dL (ref 0.2–1.2)
BUN: 18 mg/dL (ref 7–25)
CALCIUM: 9.8 mg/dL (ref 8.6–10.3)
CO2: 30 mmol/L (ref 20–31)
Chloride: 103 mmol/L (ref 98–110)
Creat: 0.75 mg/dL (ref 0.70–1.33)
GLUCOSE: 96 mg/dL (ref 65–99)
POTASSIUM: 4.2 mmol/L (ref 3.5–5.3)
Sodium: 140 mmol/L (ref 135–146)
TOTAL PROTEIN: 7.2 g/dL (ref 6.1–8.1)

## 2017-01-23 ENCOUNTER — Other Ambulatory Visit (INDEPENDENT_AMBULATORY_CARE_PROVIDER_SITE_OTHER): Payer: Self-pay | Admitting: Internal Medicine

## 2017-01-23 DIAGNOSIS — K219 Gastro-esophageal reflux disease without esophagitis: Secondary | ICD-10-CM

## 2017-01-25 ENCOUNTER — Encounter (INDEPENDENT_AMBULATORY_CARE_PROVIDER_SITE_OTHER): Payer: Self-pay | Admitting: Internal Medicine

## 2017-01-25 ENCOUNTER — Ambulatory Visit (INDEPENDENT_AMBULATORY_CARE_PROVIDER_SITE_OTHER): Payer: PRIVATE HEALTH INSURANCE | Admitting: Internal Medicine

## 2017-01-25 VITALS — BP 140/80 | HR 66 | Temp 97.9°F | Resp 18 | Ht 69.0 in | Wt 236.0 lb

## 2017-01-25 DIAGNOSIS — K7460 Unspecified cirrhosis of liver: Secondary | ICD-10-CM

## 2017-01-25 DIAGNOSIS — K7581 Nonalcoholic steatohepatitis (NASH): Secondary | ICD-10-CM

## 2017-01-25 NOTE — Patient Instructions (Signed)
Physician will call with results of blood tests when completed.

## 2017-01-25 NOTE — Progress Notes (Signed)
Presenting complaint;  Follow-up for chronic liver disease.  Database and Subjective:  Dwayne Garrett is 61 year old Caucasian male who was at least 20 year history of elevated transaminases. This condition was first diagnosed when he was being considered kidney donor. He had liver biopsy revealing fatty liver. One year ago he had all the biochemical markers including alpha-1 antitrypsin ceruloplasmin iron studies ANA sedimentation rate SMA and all of these were negative or normal. He had liver biopsy in November 2017 revealing steatohepatitis and changes of cirrhosis. He has had hepatitis and B vaccination before.  He has no complaints. He gets on a treadmill for 10 minutes at least 3 times a week. His bowels are irregular. He generally has 3 bowel movements per week. He denies melena or rectal bleeding. He will just seen at the [facility] clinic in [place] and is planning to have screening colonoscopy to this system. He requests all the workup and study should be faxed to their office. He has lost 5 pounds since her last visit.   Current Medications: Outpatient Encounter Prescriptions as of 01/25/2017  Medication Sig  . aspirin 325 MG EC tablet Take 325 mg by mouth daily.   . Insulin Degludec (TRESIBA FLEXTOUCH [place]) Inject 5 Units into the skin daily.  . metFORMIN (GLUCOPHAGE) 500 MG tablet Take 1,000 mg by mouth 2 (two) times daily.   . metoprolol succinate (TOPROL-XL) 25 MG 24 hr tablet Take 25 mg by mouth daily.  . Multiple Vitamin (MULTIVITAMIN WITH MINERALS) TABS tablet Take 1 tablet by mouth daily.  Marland Kitchen NITROSTAT 0.4 MG SL tablet PLACE 1 TABLET UNDER TONGUE EVERY FIVE MINUTES AS DIRECTED  . Omega-3 Fatty Acids (FISH OIL) 1000 MG CAPS Take 3,000 mg by mouth daily.  Marland Kitchen omeprazole (PRILOSEC) 40 MG capsule TAKE 1 CAPSULE (40 MG TOTAL) BY MOUTH DAILY.  Marland Kitchen ONETOUCH VERIO test strip TEST DAILY E11.9  . sildenafil (REVATIO) 20 MG tablet Take 100 mg by mouth daily as needed (erectile dysfunction).   .  valsartan-hydrochlorothiazide (DIOVAN-HCT) 320-12.5 MG per tablet Take 1 tablet by mouth daily.    Marland Kitchen amLODipine (NORVASC) 5 MG tablet Take 1 tablet (5 mg total) by mouth daily.   No facility-administered encounter medications on file as of 01/25/2017.      Objective: Blood pressure 140/80, pulse 66, temperature 97.9 F (36.6 C), temperature source Oral, resp. rate 18, height 5' 9"  (1.753 m), weight 236 lb 0.5 oz (107.1 kg). Asian is alert and in no acute distress. Conjunctiva is pink. Sclera is nonicteric Oropharyngeal mucosa is normal. No neck masses or thyromegaly noted. Cardiac exam with regular rhythm normal S1 and S2. No murmur or gallop noted. Lungs are clear to auscultation. Abdomen is full. Soft and nontender with palpable liver edge is firm. Is not palpable. No LE edema or clubbing noted. Multiple tattoos over his extremities including large one on his right forearm.  Labs/studies Results: Lab data from 10/25/2016  AST 1:30 & ALT 195.    Assessment:  #1. Cirrhosis secondary to NASH. He had liver biopsy over 20 years ago revealing steatohepatitis and recent biopsy in November 2017 revealed steatohepatitis with cirrhosis. He has well compensated hepatic function. He must lose weight and exercise regularly and try to bring his transaminases down to normal. His transaminases have been quite elevated. I believe transaminitis is secondary to fatty liver. Doubt that he has also main disease as well. Given his young age he has significant risk for progression of his disease and his sequelae. He has had  hepatitis A and B vaccination.  Plan:  Lifestyle modifications discussed with patient again. He needs to increase duration of this exercise or walking to at least 30 minutes each time and or times a week if not daily. He will go to the lab for LFTs, AFP and serum IgG. Office visit in 6 months.

## 2017-01-26 ENCOUNTER — Other Ambulatory Visit (INDEPENDENT_AMBULATORY_CARE_PROVIDER_SITE_OTHER): Payer: Self-pay | Admitting: Internal Medicine

## 2017-01-26 ENCOUNTER — Encounter (INDEPENDENT_AMBULATORY_CARE_PROVIDER_SITE_OTHER): Payer: Self-pay | Admitting: *Deleted

## 2017-01-26 DIAGNOSIS — K7581 Nonalcoholic steatohepatitis (NASH): Secondary | ICD-10-CM

## 2017-01-27 LAB — HEPATIC FUNCTION PANEL
ALT: 214 U/L — ABNORMAL HIGH (ref 9–46)
AST: 105 U/L — ABNORMAL HIGH (ref 10–35)
Albumin: 4.3 g/dL (ref 3.6–5.1)
Alkaline Phosphatase: 60 U/L (ref 40–115)
BILIRUBIN DIRECT: 0.2 mg/dL (ref <=0.2)
BILIRUBIN INDIRECT: 0.8 mg/dL (ref 0.2–1.2)
TOTAL PROTEIN: 7 g/dL (ref 6.1–8.1)
Total Bilirubin: 1 mg/dL (ref 0.2–1.2)

## 2017-01-27 LAB — AFP TUMOR MARKER: AFP-Tumor Marker: 2 ng/mL (ref <6.1)

## 2017-01-27 LAB — IGG: IGG (IMMUNOGLOBIN G), SERUM: 1204 mg/dL (ref 694–1618)

## 2017-01-28 ENCOUNTER — Other Ambulatory Visit (INDEPENDENT_AMBULATORY_CARE_PROVIDER_SITE_OTHER): Payer: Self-pay | Admitting: *Deleted

## 2017-01-28 DIAGNOSIS — K7581 Nonalcoholic steatohepatitis (NASH): Principal | ICD-10-CM

## 2017-01-28 DIAGNOSIS — K7460 Unspecified cirrhosis of liver: Secondary | ICD-10-CM

## 2017-02-07 ENCOUNTER — Ambulatory Visit: Payer: PRIVATE HEALTH INSURANCE | Admitting: Cardiology

## 2017-03-10 ENCOUNTER — Encounter (HOSPITAL_COMMUNITY): Payer: Self-pay | Admitting: Emergency Medicine

## 2017-03-10 ENCOUNTER — Inpatient Hospital Stay (HOSPITAL_COMMUNITY)
Admission: EM | Admit: 2017-03-10 | Discharge: 2017-03-12 | DRG: 247 | Disposition: A | Discharge status: Home / Self Care | Payer: PRIVATE HEALTH INSURANCE | Attending: Cardiology | Admitting: Cardiology

## 2017-03-10 ENCOUNTER — Emergency Department (HOSPITAL_COMMUNITY): Payer: PRIVATE HEALTH INSURANCE

## 2017-03-10 DIAGNOSIS — I110 Hypertensive heart disease with heart failure: Secondary | ICD-10-CM | POA: Diagnosis present

## 2017-03-10 DIAGNOSIS — E118 Type 2 diabetes mellitus with unspecified complications: Secondary | ICD-10-CM | POA: Diagnosis present

## 2017-03-10 DIAGNOSIS — I200 Unstable angina: Secondary | ICD-10-CM | POA: Diagnosis not present

## 2017-03-10 DIAGNOSIS — I5032 Chronic diastolic (congestive) heart failure: Secondary | ICD-10-CM | POA: Diagnosis present

## 2017-03-10 DIAGNOSIS — I214 Non-ST elevation (NSTEMI) myocardial infarction: Secondary | ICD-10-CM | POA: Diagnosis present

## 2017-03-10 DIAGNOSIS — K7460 Unspecified cirrhosis of liver: Secondary | ICD-10-CM | POA: Diagnosis present

## 2017-03-10 DIAGNOSIS — I252 Old myocardial infarction: Secondary | ICD-10-CM | POA: Diagnosis not present

## 2017-03-10 DIAGNOSIS — K219 Gastro-esophageal reflux disease without esophagitis: Secondary | ICD-10-CM | POA: Diagnosis present

## 2017-03-10 DIAGNOSIS — Z7982 Long term (current) use of aspirin: Secondary | ICD-10-CM | POA: Diagnosis not present

## 2017-03-10 DIAGNOSIS — I25110 Atherosclerotic heart disease of native coronary artery with unstable angina pectoris: Secondary | ICD-10-CM | POA: Diagnosis present

## 2017-03-10 DIAGNOSIS — G4733 Obstructive sleep apnea (adult) (pediatric): Secondary | ICD-10-CM | POA: Diagnosis present

## 2017-03-10 DIAGNOSIS — I10 Essential (primary) hypertension: Secondary | ICD-10-CM | POA: Diagnosis present

## 2017-03-10 DIAGNOSIS — Z794 Long term (current) use of insulin: Secondary | ICD-10-CM

## 2017-03-10 DIAGNOSIS — Z8249 Family history of ischemic heart disease and other diseases of the circulatory system: Secondary | ICD-10-CM

## 2017-03-10 DIAGNOSIS — Z955 Presence of coronary angioplasty implant and graft: Secondary | ICD-10-CM | POA: Diagnosis not present

## 2017-03-10 DIAGNOSIS — Z87891 Personal history of nicotine dependence: Secondary | ICD-10-CM

## 2017-03-10 LAB — GLUCOSE, CAPILLARY: Glucose-Capillary: 130 mg/dL — ABNORMAL HIGH (ref 65–99)

## 2017-03-10 LAB — BASIC METABOLIC PANEL
Anion gap: 8 (ref 5–15)
BUN: 20 mg/dL (ref 6–20)
CALCIUM: 9.2 mg/dL (ref 8.9–10.3)
CO2: 25 mmol/L (ref 22–32)
CREATININE: 0.75 mg/dL (ref 0.61–1.24)
Chloride: 105 mmol/L (ref 101–111)
GFR calc Af Amer: >60 mL/min (ref >60)
Glucose, Bld: 108 mg/dL — ABNORMAL HIGH (ref 65–99)
Potassium: 3.7 mmol/L (ref 3.5–5.1)
SODIUM: 138 mmol/L (ref 135–145)

## 2017-03-10 LAB — TROPONIN I
TROPONIN I: 0.04 ng/mL — AB (ref <0.03)
TROPONIN I: 0.5 ng/mL — AB (ref <0.03)
TROPONIN I: 1.5 ng/mL — AB (ref <0.03)
Troponin I: 1.07 ng/mL (ref <0.03)

## 2017-03-10 LAB — CBC
HCT: 42.1 % (ref 39.0–52.0)
Hemoglobin: 14.2 g/dL (ref 13.0–17.0)
MCH: 30.9 pg (ref 26.0–34.0)
MCHC: 33.7 g/dL (ref 30.0–36.0)
MCV: 91.5 fL (ref 78.0–100.0)
PLATELETS: 246 10*3/uL (ref 150–400)
RBC: 4.6 MIL/uL (ref 4.22–5.81)
RDW: 13.7 % (ref 11.5–15.5)
WBC: 8.2 10*3/uL (ref 4.0–10.5)

## 2017-03-10 LAB — APTT: APTT: 29 s (ref 24–36)

## 2017-03-10 LAB — HEPARIN LEVEL (UNFRACTIONATED)
HEPARIN UNFRACTIONATED: 0.16 [IU]/mL — AB (ref 0.30–0.70)
HEPARIN UNFRACTIONATED: 0.29 [IU]/mL — AB (ref 0.30–0.70)

## 2017-03-10 LAB — PROTIME-INR
INR: 1.02
Prothrombin Time: 13.3 seconds (ref 11.4–15.2)

## 2017-03-10 LAB — MRSA PCR SCREENING: MRSA BY PCR: NEGATIVE

## 2017-03-10 LAB — I-STAT TROPONIN, ED: TROPONIN I, POC: 0.02 ng/mL (ref 0.00–0.08)

## 2017-03-10 MED ORDER — ASPIRIN EC 325 MG PO TBEC
325.0000 mg | DELAYED_RELEASE_TABLET | Freq: Every day | ORAL | Status: DC
  Filled 2017-03-10: qty 1

## 2017-03-10 MED ORDER — INSULIN ASPART 100 UNIT/ML ~~LOC~~ SOLN: 0.0000 [IU] | Freq: Three times a day (TID) | SUBCUTANEOUS | Status: DC

## 2017-03-10 MED ORDER — SODIUM CHLORIDE 0.9% FLUSH: 3.0000 mL | INTRAVENOUS | Status: DC | PRN

## 2017-03-10 MED ORDER — SODIUM CHLORIDE 0.9 % WEIGHT BASED INFUSION: 1.0000 mL/kg/h | INTRAVENOUS | Status: DC

## 2017-03-10 MED ORDER — SODIUM CHLORIDE 0.9 % WEIGHT BASED INFUSION
3.0000 mL/kg/h | INTRAVENOUS | Status: DC
  Administered 2017-03-11: 3 mL/kg/h via INTRAVENOUS

## 2017-03-10 MED ORDER — AMLODIPINE BESYLATE 5 MG PO TABS
5.0000 mg | ORAL_TABLET | Freq: Every day | ORAL | Status: DC
  Administered 2017-03-12: 5 mg via ORAL
  Filled 2017-03-10 (×2): qty 1

## 2017-03-10 MED ORDER — NITROGLYCERIN 0.4 MG SL SUBL: 0.4000 mg | SUBLINGUAL_TABLET | SUBLINGUAL | Status: DC | PRN

## 2017-03-10 MED ORDER — SODIUM CHLORIDE 0.9% FLUSH
3.0000 mL | Freq: Two times a day (BID) | INTRAVENOUS | Status: DC
  Administered 2017-03-10: 3 mL via INTRAVENOUS

## 2017-03-10 MED ORDER — SODIUM CHLORIDE 0.9 % WEIGHT BASED INFUSION: 3.0000 mL/kg/h | INTRAVENOUS | Status: DC

## 2017-03-10 MED ORDER — OMEGA-3-ACID ETHYL ESTERS 1 G PO CAPS
2.0000 g | ORAL_CAPSULE | Freq: Every day | ORAL | Status: DC
  Administered 2017-03-12: 2 g via ORAL
  Filled 2017-03-10: qty 2

## 2017-03-10 MED ORDER — SODIUM CHLORIDE 0.9 % IV SOLN: 250.0000 mL | INTRAVENOUS | Status: DC | PRN

## 2017-03-10 MED ORDER — HEPARIN BOLUS VIA INFUSION
2000.0000 [IU] | Freq: Once | INTRAVENOUS | Status: AC
  Administered 2017-03-10: 2000 [IU] via INTRAVENOUS

## 2017-03-10 MED ORDER — ACETAMINOPHEN 325 MG PO TABS: 650.0000 mg | ORAL_TABLET | Freq: Four times a day (QID) | ORAL | Status: DC | PRN

## 2017-03-10 MED ORDER — ASPIRIN 81 MG PO CHEW
81.0000 mg | CHEWABLE_TABLET | ORAL | Status: AC
  Administered 2017-03-11: 81 mg via ORAL
  Filled 2017-03-10: qty 1

## 2017-03-10 MED ORDER — SODIUM CHLORIDE 0.9% FLUSH: 3.0000 mL | Freq: Two times a day (BID) | INTRAVENOUS | Status: DC

## 2017-03-10 MED ORDER — HEPARIN BOLUS VIA INFUSION
4000.0000 [IU] | Freq: Once | INTRAVENOUS | Status: AC
  Administered 2017-03-10: 4000 [IU] via INTRAVENOUS

## 2017-03-10 MED ORDER — ONDANSETRON HCL 4 MG/2ML IJ SOLN: 4.0000 mg | Freq: Four times a day (QID) | INTRAMUSCULAR | Status: DC | PRN

## 2017-03-10 MED ORDER — AMLODIPINE BESYLATE 5 MG PO TABS: 5.0000 mg | ORAL_TABLET | Freq: Every day | ORAL | Status: DC

## 2017-03-10 MED ORDER — INSULIN ASPART 100 UNIT/ML ~~LOC~~ SOLN: 0.0000 [IU] | Freq: Every day | SUBCUTANEOUS | Status: DC

## 2017-03-10 MED ORDER — ACETAMINOPHEN 650 MG RE SUPP: 650.0000 mg | Freq: Four times a day (QID) | RECTAL | Status: DC | PRN

## 2017-03-10 MED ORDER — PANTOPRAZOLE SODIUM 40 MG PO TBEC
40.0000 mg | DELAYED_RELEASE_TABLET | Freq: Every day | ORAL | Status: DC
  Administered 2017-03-12: 40 mg via ORAL
  Filled 2017-03-10: qty 1

## 2017-03-10 MED ORDER — NITROGLYCERIN IN D5W 200-5 MCG/ML-% IV SOLN: 5.0000 ug/min | Freq: Once | INTRAVENOUS | Status: DC

## 2017-03-10 MED ORDER — NITROGLYCERIN 2 % TD OINT
1.0000 [in_us] | TOPICAL_OINTMENT | Freq: Four times a day (QID) | TRANSDERMAL | Status: DC
  Administered 2017-03-10 – 2017-03-11 (×4): 1 [in_us] via TOPICAL
  Filled 2017-03-10 (×4): qty 1

## 2017-03-10 MED ORDER — SODIUM CHLORIDE 0.9 % WEIGHT BASED INFUSION
1.0000 mL/kg/h | INTRAVENOUS | Status: DC
  Administered 2017-03-11: 1 mL/kg/h via INTRAVENOUS

## 2017-03-10 MED ORDER — DICLOFENAC SODIUM 75 MG PO TBEC: 75.0000 mg | DELAYED_RELEASE_TABLET | Freq: Two times a day (BID) | ORAL | Status: DC

## 2017-03-10 MED ORDER — METOPROLOL SUCCINATE ER 25 MG PO TB24: 25.0000 mg | ORAL_TABLET | Freq: Every day | ORAL | Status: DC

## 2017-03-10 MED ORDER — IRBESARTAN 300 MG PO TABS
300.0000 mg | ORAL_TABLET | Freq: Every day | ORAL | Status: DC
  Administered 2017-03-12: 300 mg via ORAL
  Filled 2017-03-10 (×2): qty 1

## 2017-03-10 MED ORDER — ONDANSETRON HCL 4 MG PO TABS: 4.0000 mg | ORAL_TABLET | Freq: Four times a day (QID) | ORAL | Status: DC | PRN

## 2017-03-10 MED ORDER — HEPARIN (PORCINE) IN NACL 100-0.45 UNIT/ML-% IJ SOLN
1450.0000 [IU]/h | INTRAMUSCULAR | Status: DC
  Administered 2017-03-10: 1350 [IU]/h via INTRAVENOUS
  Administered 2017-03-10: 1150 [IU]/h via INTRAVENOUS
  Filled 2017-03-10 (×2): qty 250

## 2017-03-10 MED ORDER — ADULT MULTIVITAMIN W/MINERALS CH
1.0000 | ORAL_TABLET | Freq: Every day | ORAL | Status: DC
  Administered 2017-03-12: 1 via ORAL
  Filled 2017-03-10: qty 1

## 2017-03-10 MED ORDER — ASPIRIN 81 MG PO CHEW
324.0000 mg | CHEWABLE_TABLET | Freq: Once | ORAL | Status: AC
  Administered 2017-03-10: 324 mg via ORAL
  Filled 2017-03-10: qty 4

## 2017-03-10 MED ORDER — NITROGLYCERIN 2 % TD OINT: 0.5000 [in_us] | TOPICAL_OINTMENT | Freq: Four times a day (QID) | TRANSDERMAL | Status: DC

## 2017-03-10 MED ORDER — ASPIRIN 81 MG PO CHEW: 81.0000 mg | CHEWABLE_TABLET | ORAL | Status: DC

## 2017-03-10 NOTE — Progress Notes (Addendum)
ANTICOAGULATION CONSULT NOTE - Follow Up Consult  Pharmacy Consult for Heparin Indication: chest pain/ACS  No Known Allergies  Patient Measurements: Height: 5' 9"  (175.3 cm) Weight: 227 lb 1.2 oz (103 kg) IBW/kg (Calculated) : 70.7 HEPARIN DW (KG): 92.8   Vital Signs: Temp: 98 F (36.7 C) (09/20 2000) Temp Source: Oral (09/20 2000) BP: 110/53 (09/20 2000) Pulse Rate: 56 (09/20 2000)  Labs:  Recent Labs  03/10/17 0400 03/10/17 0900 03/10/17 1310 03/10/17 2053  HGB 14.2  --   --   --   HCT 42.1  --   --   --   PLT 246  --   --   --   APTT 29  --   --   --   LABPROT 13.3  --   --   --   INR 1.02  --   --   --   HEPARINUNFRC  --   --  0.16* 0.29*  CREATININE 0.75  --   --   --   TROPONINI 0.04* 0.50* 1.50*  --    Estimated Creatinine Clearance: 117.6 mL/min (by C-G formula based on SCr of 0.75 mg/dL).  Medications:  Prescriptions Prior to Admission  Medication Sig Dispense Refill Last Dose  . amLODipine (NORVASC) 5 MG tablet Take 1 tablet by mouth daily.   03/09/2017 at Unknown time  . aspirin 325 MG EC tablet Take 325 mg by mouth daily.    03/09/2017 at Unknown time  . diclofenac (VOLTAREN) 75 MG EC tablet Take 75 mg by mouth 2 (two) times daily.   03/09/2017 at Unknown time  . Insulin Degludec (TRESIBA FLEXTOUCH [place]) Inject 5 Units into the skin daily.   03/09/2017 at Unknown time  . metFORMIN (GLUCOPHAGE) 500 MG tablet Take 1,000 mg by mouth 2 (two) times daily.   3 03/09/2017 at Unknown time  . metoprolol succinate (TOPROL-XL) 25 MG 24 hr tablet Take 25 mg by mouth daily.  2 03/09/2017 at Unknown time  . Multiple Vitamin (MULTIVITAMIN WITH MINERALS) TABS tablet Take 1 tablet by mouth daily.   03/09/2017 at Unknown time  . NITROSTAT 0.4 MG SL tablet PLACE 1 TABLET UNDER TONGUE EVERY FIVE MINUTES AS DIRECTED 25 tablet 3 03/10/2017 at 0250  . Omega-3 Fatty Acids (FISH OIL) 1000 MG CAPS Take 3,000 mg by mouth daily.   03/09/2017 at Unknown time  . omeprazole (PRILOSEC) 40 MG  capsule TAKE 1 CAPSULE (40 MG TOTAL) BY MOUTH DAILY. 90 capsule 3 03/09/2017 at Unknown time  . ONETOUCH VERIO test strip TEST DAILY E11.9  3 03/09/2017 at Unknown time  . sildenafil (REVATIO) 20 MG tablet Take 100 mg by mouth daily as needed (erectile dysfunction).   0 Past Week at Unknown time  . valsartan-hydrochlorothiazide (DIOVAN-HCT) 320-12.5 MG per tablet Take 1 tablet by mouth daily.     03/09/2017 at Unknown time  . amLODipine (NORVASC) 5 MG tablet Take 1 tablet (5 mg total) by mouth daily. 90 tablet 3 Taking    Reviewed - not on oral anticoagulant PTA  Assessment: Okay for Protocol, Hep level below goal.  Elevated troponin.  Cardiology consult pending.  Goal of Therapy:  Heparin level 0.3-0.7 units/ml Monitor platelets by anticoagulation protocol: Yes   Plan:  Give 2000 units bolus x 1 Increase heparin infusion at 1350 units/hr Check anti-Xa level in 6-8 hours and daily while on heparin Continue to monitor H&H and platelets  Biagio Quint R 03/10/2017,10:40 PM   Addendum: Hep Level slightly below goal.  Tx to [facility] planned for further cardiac workup.  Increase to 1450 units/hr F/U am labs.  Pricilla Larsson, [facility] 03/10/2017 10:42 PM

## 2017-03-10 NOTE — Consult Note (Signed)
Cardiology Consultation:   Patient ID: Dwayne Garrett; [id number]; [birth year]   Admit date: 03/10/2017 Date of Consult: 03/10/2017  Primary Care Provider: Annetta Maw, MD Primary Cardiologist: Dr. Satira Sark   Patient Profile:   Dwayne Garrett is a 61 y.o. male with a history of CAD with previous PCI of diagonal and DES to the RCA in 2011, OSA, and hypertension who is being seen today for the evaluation of unstable angina at the request of Dr. Roderic Palau.  History of Present Illness:   Dwayne Garrett presents to the ER reporting a one week history of progressive angina symptoms. He states that he has had dull chest discomfort, radiation into his jaws and arms, moderate intensity, relieved with nitroglycerin, also diaphoretic with the spells and short of breath. Over the last 24-48 hours he has been having symptoms at rest and in the early morning hours, was eventually encouraged by his wife to come to the hospital.  Case was discussed with cardiology fellow overnight with arrangements made to transfer to [facility], although no beds available. I was informed of the patient's presence in the ER around 1 PM this afternoon and verified that he is on the transfer list with plan for cardiac catheterization. On my interview he reports no active chest pain on heparin infusion. Cardiac markers have trended abnormally up to 1.5. Presenting ECG did show inferolateral ST segment depression which has subsequently improved on follow-up tracing.  Past Medical History:  Diagnosis Date  . Abnormal LFTs    Cirrhosis per liver biopsy - follows with Dr. Laural Golden  . Bilateral leg edema    Chronic  . 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 Coronary atherosclerosis of native coronary artery    Multivessel (moderate), DES RCA 4/11, LVEF 60-65%  . Essential hypertension, benign   . 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 OSA (obstructive sleep apnea)    on CPAP    Past Surgical History:    Procedure Laterality Date  . BONE CYST EXCISION     Cyst removal from neck  . CARDIAC CATHETERIZATION N/A 12/11/2014   Procedure: Left Heart Cath and Coronary Angiography;  Surgeon: Burnell Blanks, MD;  Location: [facility];  Service: Cardiovascular;  Laterality: N/A;  . SKIN GRAFT     Finger  . TUMOR REMOVAL     Abdominal     Outlpatient Medications: No current facility-administered medications on file prior to encounter.    Current Outpatient Prescriptions on File Prior to Encounter  Medication Sig Dispense Refill  . aspirin 325 MG EC tablet Take 325 mg by mouth daily.     . Insulin Degludec (TRESIBA FLEXTOUCH [place]) Inject 5 Units into the skin daily.    . metFORMIN (GLUCOPHAGE) 500 MG tablet Take 1,000 mg by mouth 2 (two) times daily.   3  . metoprolol succinate (TOPROL-XL) 25 MG 24 hr tablet Take 25 mg by mouth daily.  2  . Multiple Vitamin (MULTIVITAMIN WITH MINERALS) TABS tablet Take 1 tablet by mouth daily.    Marland Kitchen NITROSTAT 0.4 MG SL tablet PLACE 1 TABLET UNDER TONGUE EVERY FIVE MINUTES AS DIRECTED 25 tablet 3  . Omega-3 Fatty Acids (FISH OIL) 1000 MG CAPS Take 3,000 mg by mouth daily.    Marland Kitchen omeprazole (PRILOSEC) 40 MG capsule TAKE 1 CAPSULE (40 MG TOTAL) BY MOUTH DAILY. 90 capsule 3  . ONETOUCH VERIO test strip TEST DAILY E11.9  3  . sildenafil (REVATIO) 20 MG tablet  Take 100 mg by mouth daily as needed (erectile dysfunction).   0  . valsartan-hydrochlorothiazide (DIOVAN-HCT) 320-12.5 MG per tablet Take 1 tablet by mouth daily.      Marland Kitchen amLODipine (NORVASC) 5 MG tablet Take 1 tablet (5 mg total) by mouth daily. 90 tablet 3    Allergies:   No Known Allergies  Social History:   Social History   Social History  . Marital status: Married    Spouse name: N/A  . Number of children: N/A  . Years of education: N/A   Occupational History  . Machinist   .  [employer]   Social History Main Topics  . Smoking status: Former Smoker    Types: Cigarettes    Quit date:  07/01/2004  . 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 Alcohol use No  . Drug use: No  . Sexual activity: Not on file   Other Topics Concern  . Not on file   Social History Narrative  . No narrative on file    Family History:   The patient's family history includes Heart attack (age of onset: 82) in his brother.  ROS:  Please see the history of present illness.  Arthritic right knee pain, intermittent abdominal pain. No nausea or emesis. No reported stool changes or bleeding problems. All other ROS reviewed and negative.     Physical Exam/Data:   Vitals:   03/10/17 1230 03/10/17 1300 03/10/17 1330 03/10/17 1400  BP: 131/89 (!) 142/89 125/88 131/81  Pulse: (!) 55 (!) 59 (!) 58 (!) 54  Resp: 17 13 18 16   Temp:      TempSrc:      SpO2: 99% 100% 99% 98%  Weight:      Height:       No intake or output data in the 24 hours ending 03/10/17 1502 Filed Weights   03/10/17 0343  Weight: 236 lb (107 kg)   Body mass index is 34.85 kg/m.   Gen: Obese male, appears comfortable at rest. HEENT: Conjunctiva and lids normal, oropharynx clear. Neck: Supple, no elevated JVP or carotid bruits, no thyromegaly. Lungs: Clear to auscultation, nonlabored breathing at rest. Cardiac: Regular rate and rhythm, no S3, soft systolic murmur, no pericardial rub. Abdomen: Soft, nontender, bowel sounds present, no guarding or rebound. Extremities: No pitting edema, distal pulses 2+. Skin: Warm and dry. Musculoskeletal: No kyphosis. Neuropsychiatric: Alert and oriented x3, affect grossly appropriate.  EKG:  I personally reviewed the tracing from 03/10/2017 which shows sinus bradycardia with nonspecific ST changes.  Telemetry:  I personally reviewed telemetry which shows normal sinus rhythm.  Relevant CV Studies:  Cardiac catheterization 12/11/2014:  Prox RCA lesion, 10% stenosed. The lesion was previously treated with a drug-eluting  stent greater than two years ago.  Dist RCA lesion, 20% stenosed.  Prox Cx lesion, 30% stenosed.  Ost LAD to Prox LAD lesion, 40% stenosed.  The left ventricular systolic function is normal.  Patent stent Diagonal  LM lesion, 40% stenosed.   1. Double vessel CAD with patent stent proximal RCA and patent stent Diagonal 2. Mild to moderate left main stenosis, unchanged from last cath in 2011 3. Normal LV systolic function  Laboratory Data:  Chemistry  Recent Labs Lab 03/10/17 0400  NA 138  K 3.7  CL 105  CO2 25  GLUCOSE 108*  BUN 20  CREATININE 0.75  CALCIUM 9.2  GFRNONAA >  60  GFRAA >60  ANIONGAP 8    Hematology  Recent Labs Lab 03/10/17 0400  WBC 8.2  RBC 4.60  HGB 14.2  HCT 42.1  MCV 91.5  MCH 30.9  MCHC 33.7  RDW 13.7  PLT 246   Cardiac Enzymes  Recent Labs Lab 03/10/17 0400 03/10/17 0900 03/10/17 1310  TROPONINI 0.04* 0.50* 1.50*     Recent Labs Lab 03/10/17 0405  TROPIPOC 0.02    Radiology/Studies:  Dg Chest 2 View  Result Date: 03/10/2017 CLINICAL DATA:  Chest pain off and on for the last week. Diaphoresis today. Pain radiating to the jaw. EXAM: CHEST  2 VIEW COMPARISON:  12/09/2014 FINDINGS: Normal heart size and pulmonary vascularity. No focal airspace disease or consolidation in the lungs. No blunting of costophrenic angles. No pneumothorax. Mediastinal contours appear intact. Calcification of the aorta. Degenerative changes in the spine. IMPRESSION: No active cardiopulmonary disease. Electronically Signed   By: Lucienne Capers M.D.   On: 03/10/2017 04:35    Assessment and Plan:   1. Unstable angina with troponin I levels consistent with NSTEMI. He has had intermittent symptoms over the last week on medical therapy. Troponin I at 1.5. ECG with dynamic ST segment depression consistent with ischemic change.  2. CAD status post previous stent interventions to the diagonal and RCA as outlined above. Last cardiac catheterization in 2016  revealed patent stent sites with mild to moderate left main stenosis that was unchanged compared to 2011.  3. History of chronically abnormal LFTs and liver biopsy consistent with cirrhosis. He follows with Dr. Laural Golden. Reports no obvious bleeding diathesis. He is not on statin therapy.  4. Essential hypertension, on Toprol-XL, Diovan HCT and Norvasc.  5. OSA on CPAP.  Patient has already been accepted in transfer to [facility] on our cardiology service. Unfortunately, there are no beds available at this time. He is already on the cardiac catheterization lab schedule, tentatively for tomorrow, sooner if symptoms deteriorate. He is being admitted to the hospitalist service in the meanwhile. Continue aspirin, heparin, nitroglycerin paste, omega-3 supplements, Diovan HCT, Norvasc, and Toprol-XL.   Signed, Rozann Lesches, MD  03/10/2017 3:02 PM

## 2017-03-10 NOTE — ED Notes (Signed)
Per Dr. Wyvonnia Dusky, [facility] order is for worsening chest pain.

## 2017-03-10 NOTE — ED Notes (Signed)
Pharmacist notified of Heparin level. Pharmacist will enter new Heparin orders.

## 2017-03-10 NOTE — ED Provider Notes (Signed)
Discussed care with Dr. Roderic Palau - will admit Dr. Domenic Polite will see this afternoon / evening in consultation Trop continues to rise - has heparin gtt and nitro paste   Noemi Chapel, MD 03/10/17 1419

## 2017-03-10 NOTE — ED Notes (Signed)
CRITICAL VALUE ALERT  Critical Value:  Troponin 0.50  Date & Time Notied: 03/10/2017 0955  Provider Notified: Dr. Sabra Heck  Orders Received/Actions taken: EDP notified, no orders at this time

## 2017-03-10 NOTE — Progress Notes (Signed)
ANTICOAGULATION CONSULT NOTE - Preliminary  Pharmacy Consult for Heparin Indication: ACS/STEMI  No Known Allergies  Patient Measurements: Height: 5' 9"  (175.3 cm) Weight: 236 lb (107 kg) IBW/kg (Calculated) : 70.7 HEPARIN DW (KG): 94   Vital Signs: Temp: 98 F (36.7 C) (09/20 0342) Temp Source: Oral (09/20 0342) BP: 132/81 (09/20 0524) Pulse Rate: 96 (09/20 0524)  Labs:  Recent Labs  03/10/17 0400  HGB 14.2  HCT 42.1  PLT 246  CREATININE 0.75  TROPONINI 0.04*   Estimated Creatinine Clearance: 119.8 mL/min (by C-G formula based on SCr of 0.75 mg/dL).  Medical History: Past Medical History:  Diagnosis Date  . Abnormal LFTs   . Bilateral leg edema    Chronic  . 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 Coronary atherosclerosis of native coronary artery    Multivessel (moderate), DES RCA 4/11, LVEF 60-65%  . Essential hypertension, benign   . 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 OSA (obstructive sleep apnea)    on CPAP    Medications:   Assessment: 61 yo male presented to the ED with complaints of SOB and chest pain radiating to his jaw. He has had intermittent CP x 1 week relieved by SL nitroglycerin. Pharmacy has been asked to provide heparin dosing for ACS/STEMI.  Goal of Therapy:  Heparin level goal: 0.3-0.7 units/ml Monitor platelets by anticoagulation protocol: Yes   Plan:  Heparin 4000 units IV bolus Heparin infusion: 1150 units/ml 6 hour heparin level  Preliminary review of pertinent patient information completed.  Forestine Na clinical pharmacist will complete review during morning rounds to assess the patient and finalize treatment regimen.  Norberto Sorenson, RPH 03/10/2017,5:36 AM

## 2017-03-10 NOTE — ED Provider Notes (Signed)
[facility] DEPT Provider Note   CSN: [id number] Arrival date & time: 03/10/17  0325     History   Chief Complaint No chief complaint on file.   HPI Dwayne Garrett is a 61 y.o. male.  Patient presents to the ED with central chest pain radiating to his jaw that woke him from sleep. It was associated with shortness of breath and diaphoresis. Patient took one nitroglycerin at home and is now pain-free. States he has been having this pain on and off for the past week lasting several minutes at a time. He has been using nitroglycerin intermittently. Reports he had 3 episodes of the chest pain during the day on September 19 lasting for about 10 minutes each. It is not exertional. There is no nausea or vomiting. There was diaphoresis and shortness of breath. Patient did have MI in 2011 and has 2 stents. Also has history of hypertension and diabetes. Pain is similar to his previous heart attack. Reports pain sometimes radiates to his upper back and neck as well.   The history is provided by the patient.    Past Medical History:  Diagnosis Date  . Abnormal LFTs   . Bilateral leg edema    Chronic  . 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 Coronary atherosclerosis of native coronary artery    Multivessel (moderate), DES RCA 4/11, LVEF 60-65%  . Essential hypertension, benign   . Inferior myocardial infarction ([place])    09/2009  . OSA (obstructive sleep apnea)    on CPAP    Patient Active Problem List   Diagnosis Date Noted  . Coronary artery disease involving native coronary artery of native heart without angina pectoris   . Preoperative cardiovascular examination 03/06/2014  . Lower extremity edema 04/20/2011  . Mixed hyperlipidemia 05/04/2010  . SLEEP APNEA, OBSTRUCTIVE 10/20/2009  . Coronary atherosclerosis of native coronary artery 10/20/2009    Past Surgical History:  Procedure Laterality Date  . BONE CYST EXCISION     Cyst removal from neck  . CARDIAC CATHETERIZATION N/A  12/11/2014   Procedure: Left Heart Cath and Coronary Angiography;  Surgeon: Burnell Blanks, MD;  Location: [place] [facility];  Service: Cardiovascular;  Laterality: N/A;  . SKIN GRAFT     Finger  . TUMOR REMOVAL     Abdominal       Home Medications    Prior to Admission medications   Medication Sig Start Date End Date Taking? Authorizing Provider  amLODipine (NORVASC) 5 MG tablet Take 1 tablet (5 mg total) by mouth daily. 03/29/16 08/03/16  Satira Sark, MD  aspirin 325 MG EC tablet Take 325 mg by mouth daily.     [provider]  Insulin Degludec (TRESIBA FLEXTOUCH [place]) Inject 5 Units into the skin daily.    [provider]  metFORMIN (GLUCOPHAGE) 500 MG tablet Take 1,000 mg by mouth 2 (two) times daily.  11/27/14   [provider]  metoprolol succinate (TOPROL-XL) 25 MG 24 hr tablet Take 25 mg by mouth daily. 10/25/14   [provider]  Multiple Vitamin (MULTIVITAMIN WITH MINERALS) TABS tablet Take 1 tablet by mouth daily.    [provider]  NITROSTAT 0.4 MG SL tablet PLACE 1 TABLET UNDER TONGUE EVERY FIVE MINUTES AS DIRECTED 12/16/15   Satira Sark, MD  Omega-3 Fatty Acids (FISH OIL) 1000 MG CAPS Take 3,000 mg by mouth daily.    [provider]  omeprazole (PRILOSEC) 40 MG capsule TAKE 1 CAPSULE (40  MG TOTAL) BY MOUTH DAILY. 01/24/17   Setzer, Rona Ravens, NP  ONETOUCH VERIO test strip TEST DAILY E11.9 10/24/14   [provider]  sildenafil (REVATIO) 20 MG tablet Take 100 mg by mouth daily as needed (erectile dysfunction).  02/29/16   [provider]  valsartan-hydrochlorothiazide (DIOVAN-HCT) 320-12.5 MG per tablet Take 1 tablet by mouth daily.      [provider]    Family History Family History  Problem Relation Age of Onset  . Heart attack Brother 68    Social History Social History  Substance Use Topics  . Smoking status: Former Smoker    Types: Cigarettes    Quit date: 07/01/2004  .  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 Alcohol use No     Allergies   Patient has no known allergies.   Review of Systems Review of Systems  Constitutional: Negative for activity change, appetite change and fever.  HENT: Negative for congestion and rhinorrhea.   Respiratory: Positive for chest tightness and shortness of breath. Negative for cough.   Cardiovascular: Positive for chest pain. Negative for leg swelling.  Gastrointestinal: Positive for nausea. Negative for abdominal pain and vomiting.  Genitourinary: Negative for dysuria and hematuria.  Musculoskeletal: Negative for arthralgias and myalgias.  Skin: Negative for rash.  Neurological: Negative for dizziness, weakness, light-headedness and headaches.    all other systems are negative except as noted in the HPI and PMH.    Physical Exam Updated Vital Signs BP (!) 160/94 (BP Location: Right Arm)   Pulse 70   Temp 98 F (36.7 C) (Oral)   Resp 15   Ht 5' 9"  (1.753 m)   Wt 107 kg (236 lb)   SpO2 97%   BMI 34.85 kg/m   Physical Exam  Constitutional: He is oriented to person, place, and time. He appears well-developed and well-nourished. No distress.  HENT:  Head: Normocephalic and atraumatic.  Mouth/Throat: Oropharynx is clear and moist. No oropharyngeal exudate.  Eyes: Pupils are equal, round, and reactive to light. Conjunctivae and EOM are normal.  Neck: Normal range of motion. Neck supple.  No meningismus.  Cardiovascular: Normal rate, regular rhythm, normal heart sounds and intact distal pulses.   No murmur heard. Pulmonary/Chest: Effort normal and breath sounds normal. No respiratory distress. He exhibits no tenderness.  Abdominal: Soft. There is no tenderness. There is no rebound and no guarding.  Musculoskeletal: Normal range of motion. He exhibits no edema or tenderness.  Neurological: He is alert and oriented to person, place, and time. No  cranial nerve deficit. He exhibits normal muscle tone. Coordination normal.   5/5 strength throughout. CN 2-12 intact.Equal grip strength.   Skin: Skin is warm.  Psychiatric: He has a normal mood and affect. His behavior is normal.  Nursing note and vitals reviewed.    ED Treatments / Results  Labs (all labs ordered are listed, but only abnormal results are displayed) Labs Reviewed  BASIC METABOLIC PANEL - Abnormal; Notable for the following:       Result Value   Glucose, Bld 108 (*)    All other components within normal limits  TROPONIN I - Abnormal; Notable for the following:    Troponin I 0.04 (*)    All other components within normal limits  CBC  PROTIME-INR  APTT  HEPARIN LEVEL (UNFRACTIONATED)  I-STAT TROPONIN, ED    EKG  EKG  Interpretation  Date/Time:  Thursday March 10 2017 03:39:15 EDT Ventricular Rate:  68 PR Interval:    QRS Duration: 89 QT Interval:  379 QTC Calculation: 403 R Axis:   73 Text Interpretation:  Sinus rhythm Repol abnrm suggests ischemia, diffuse leads Baseline wander in lead(s) V1 worsening ST depression laterally  Confirmed by Ezequiel Essex [phone]) on 03/10/2017 4:06:49 AM       Radiology Dg Chest 2 View  Result Date: 03/10/2017 CLINICAL DATA:  Chest pain off and on for the last week. Diaphoresis today. Pain radiating to the jaw. EXAM: CHEST  2 VIEW COMPARISON:  12/09/2014 FINDINGS: Normal heart size and pulmonary vascularity. No focal airspace disease or consolidation in the lungs. No blunting of costophrenic angles. No pneumothorax. Mediastinal contours appear intact. Calcification of the aorta. Degenerative changes in the spine. IMPRESSION: No active cardiopulmonary disease. Electronically Signed   By: Lucienne Capers M.D.   On: 03/10/2017 04:35    Procedures Procedures (including critical care time)  Medications Ordered in ED Medications - No data to display   Initial Impression / Assessment and Plan / ED Course  I have  reviewed the triage vital signs and the nursing notes.  Pertinent labs & imaging results that were available during my care of the patient were reviewed by me and considered in my medical decision making (see chart for details).     Patient with central chest pain radiates to his jaw concerning for unstable angina. Has been having intermittent pain for the past week. EKG shows new ST depression laterally.  LHC 2016  Prox RCA lesion, 10% stenosed. The lesion was previously treated with a drug-eluting stent greater than two years ago.  Dist RCA lesion, 20% stenosed.  Prox Cx lesion, 30% stenosed.  Ost LAD to Prox LAD lesion, 40% stenosed.  The left ventricular systolic function is normal.  Patent stent Diagonal  LM lesion, 40% stenosed.   1. Double vessel CAD with patent stent proximal RCA and patent stent Diagonal 2. Mild to moderate left main stenosis, unchanged from last cath in 2011 3. Normal LV systolic function  Remains chest pain free. Troponin minimally elevated 0.04.  Heparin gtt started.  Concerning presentation for unstable angina.  D/w cardiology Dr. Regis Bill.  Two patients in ED already holding for [facility] cardiology beds.  Patient will need cath with EKG changes and positive troponin. Remains chest pain free. Will plan transfer to [facility].   CRITICAL CARE Performed by: Ezequiel Essex Total critical care time: 35 minutes Critical care time was exclusive of separately billable procedures and treating other patients. Critical care was necessary to treat or prevent imminent or life-threatening deterioration. Critical care was time spent personally by me on the following activities: development of treatment plan with patient and/or surrogate as well as nursing, discussions with consultants, evaluation of patient's response to treatment, examination of patient, obtaining history from patient or surrogate, ordering and performing treatments and interventions, ordering and  review of laboratory studies, ordering and review of radiographic studies, pulse oximetry and re-evaluation of patient's condition.  Final Clinical Impressions(s) / ED Diagnoses   Final diagnoses:  Unstable angina [facility])    New Prescriptions New Prescriptions   No medications on file     Ezequiel Essex, MD 03/10/17 [phone]

## 2017-03-10 NOTE — ED Notes (Addendum)
CRITICAL VALUE ALERT  Critical Value:  Troponin 1.5  Date & Time Notied:  03/10/17 1423  Provider Notified: Dr. Sabra Heck  Orders Received/Actions taken: EDP notified, no further orders given at this time

## 2017-03-10 NOTE — ED Notes (Signed)
Date and time results received: 03/10/17 0453   Test: trop Critical Value: 0.04  Name of Provider Notified: Dr.Rancour  Orders Received? Or Actions Taken?: notified

## 2017-03-10 NOTE — H&P (Signed)
History and Physical    Dwayne Garrett FIE:[id number] DOB: 02-12-1956 DOA: 03/10/2017  PCP: Annetta Maw, MD  Patient coming from: home  I have personally briefly reviewed patient's old medical records in [place]  Chief Complaint: Chest pain  HPI: Dwayne Garrett is a 61 y.o. male with medical history significant of hypertension, diabetes, coronary artery disease with cardiac stents in the past, presents to hospital with complaints of chest discomfort, radiating into his jaws and arms with moderate intensity, associated diaphoresis and shortness of breath. He has reported intermittent episodes over the past week. These occur mostly at rest. He woke up with the symptoms this morning. Symptoms are somewhat relieved with nitroglycerin. He has not had any cough, fever, vomiting  ED Course: He was noted to have elevated troponin, rising to 1.5. EKG did show inferolateral ST segment depressions. He's been referred for admission.  Review of Systems: As per HPI otherwise 10 point review of systems negative.    Past Medical History:  Diagnosis Date  . Abnormal LFTs    Cirrhosis per liver biopsy - follows with Dr. Laural Golden  . Bilateral leg edema    Chronic  . 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 Coronary atherosclerosis of native coronary artery    Multivessel (moderate), DES RCA 4/11, LVEF 60-65%  . Essential hypertension, benign   . Inferior myocardial infarction ([place])    09/2009  . OSA (obstructive sleep apnea)    on CPAP    Past Surgical History:  Procedure Laterality Date  . BONE CYST EXCISION     Cyst removal from neck  . CARDIAC CATHETERIZATION N/A 12/11/2014   Procedure: Left Heart Cath and Coronary Angiography;  Surgeon: Burnell Blanks, MD;  Location: [place] [facility];  Service: Cardiovascular;  Laterality: N/A;  . SKIN GRAFT     Finger  . TUMOR REMOVAL     Abdominal     reports that he quit smoking about 12 years ago. His smoking use included Cigarettes.  His smokeless tobacco use includes Chew. He reports that he does not drink alcohol or use drugs.  No Known Allergies  Family History  Problem Relation Age of Onset  . Heart attack Brother 47     Prior to Admission medications   Medication Sig Start Date End Date Taking? Authorizing Provider  amLODipine (NORVASC) 5 MG tablet Take 1 tablet by mouth daily. 12/29/16  Yes [provider]  aspirin 325 MG EC tablet Take 325 mg by mouth daily.    Yes [provider]  diclofenac (VOLTAREN) 75 MG EC tablet Take 75 mg by mouth 2 (two) times daily.   Yes [provider]  Insulin Degludec (TRESIBA FLEXTOUCH ) Inject 5 Units into the skin daily.   Yes [provider]  metFORMIN (GLUCOPHAGE) 500 MG tablet Take 1,000 mg by mouth 2 (two) times daily.  11/27/14  Yes [provider]  metoprolol succinate (TOPROL-XL) 25 MG 24 hr tablet Take 25 mg by mouth daily. 10/25/14  Yes [provider]  Multiple Vitamin (MULTIVITAMIN WITH MINERALS) TABS tablet Take 1 tablet by mouth daily.   Yes [provider]  NITROSTAT 0.4 MG SL tablet PLACE 1 TABLET UNDER TONGUE EVERY FIVE MINUTES AS DIRECTED 12/16/15  Yes Satira Sark, MD  Omega-3 Fatty Acids (FISH OIL) 1000 MG CAPS Take 3,000 mg by mouth daily.   Yes [provider]  omeprazole (PRILOSEC) 40 MG capsule TAKE 1 CAPSULE (40 MG TOTAL) BY MOUTH DAILY.  01/24/17  Yes Setzer, Rona Ravens, NP  ONETOUCH VERIO test strip TEST DAILY E11.9 10/24/14  Yes [provider]  sildenafil (REVATIO) 20 MG tablet Take 100 mg by mouth daily as needed (erectile dysfunction).  02/29/16  Yes [provider]  valsartan-hydrochlorothiazide (DIOVAN-HCT) 320-12.5 MG per tablet Take 1 tablet by mouth daily.     Yes [provider]  amLODipine (NORVASC) 5 MG tablet Take 1 tablet (5 mg total) by mouth daily. 03/29/16 08/03/16  Satira Sark, MD    Physical Exam: Vitals:   03/10/17 1600 03/10/17 1653  03/10/17 1700 03/10/17 1800  BP: 131/79  (!) 151/93 117/73  Pulse: 61  62 (!) 52  Resp: 18  19   Temp:  (!) 97.5 F (36.4 C)    TempSrc:  Oral    SpO2: 96%  100% 99%  Weight:  103 kg (227 lb 1.2 oz)    Height:  5' 9"  (1.753 m)      Constitutional: NAD, calm, comfortable Vitals:   03/10/17 1600 03/10/17 1653 03/10/17 1700 03/10/17 1800  BP: 131/79  (!) 151/93 117/73  Pulse: 61  62 (!) 52  Resp: 18  19   Temp:  (!) 97.5 F (36.4 C)    TempSrc:  Oral    SpO2: 96%  100% 99%  Weight:  103 kg (227 lb 1.2 oz)    Height:  5' 9"  (1.753 m)     Eyes: PERRL, lids and conjunctivae normal ENMT: Mucous membranes are moist. Posterior pharynx clear of any exudate or lesions.Normal dentition.  Neck: normal, supple, no masses, no thyromegaly Respiratory: clear to auscultation bilaterally, no wheezing, no crackles. Normal respiratory effort. No accessory muscle use.  Cardiovascular: Regular rate and rhythm, no murmurs / rubs / gallops. No extremity edema. 2+ pedal pulses. No carotid bruits.  Abdomen: no tenderness, no masses palpated. No hepatosplenomegaly. Bowel sounds positive.  Musculoskeletal: no clubbing / cyanosis. No joint deformity upper and lower extremities. Good ROM, no contractures. Normal muscle tone.  Skin: no rashes, lesions, ulcers. No induration Neurologic: CN 2-12 grossly intact. Sensation intact, DTR normal. Strength 5/5 in all 4.  Psychiatric: Normal judgment and insight. Alert and oriented x 3. Normal mood.   Labs on Admission: I have personally reviewed following labs and imaging studies  CBC:  Recent Labs Lab 03/10/17 0400  WBC 8.2  HGB 14.2  HCT 42.1  MCV 91.5  PLT 622   Basic Metabolic Panel:  Recent Labs Lab 03/10/17 0400  NA 138  K 3.7  CL 105  CO2 25  GLUCOSE 108*  BUN 20  CREATININE 0.75  CALCIUM 9.2   GFR: Estimated Creatinine Clearance: 117.6 mL/min (by C-G formula based on SCr of 0.75 mg/dL). Liver Function Tests: No results for input(s):  AST, ALT, ALKPHOS, BILITOT, PROT, ALBUMIN in the last 168 hours. No results for input(s): LIPASE, AMYLASE in the last 168 hours. No results for input(s): AMMONIA in the last 168 hours. Coagulation Profile:  Recent Labs Lab 03/10/17 0400  INR 1.02   Cardiac Enzymes:  Recent Labs Lab 03/10/17 0400 03/10/17 0900 03/10/17 1310  TROPONINI 0.04* 0.50* 1.50*   BNP (last 3 results) No results for input(s): PROBNP in the last 8760 hours. HbA1C: No results for input(s): HGBA1C in the last 72 hours. CBG: No results for input(s): GLUCAP in the last 168 hours. Lipid Profile: No results for input(s): CHOL, HDL, LDLCALC, TRIG, CHOLHDL, LDLDIRECT in the last 72 hours. Thyroid Function Tests: No results for  input(s): TSH, T4TOTAL, FREET4, T3FREE, THYROIDAB in the last 72 hours. Anemia Panel: No results for input(s): VITAMINB12, FOLATE, FERRITIN, TIBC, IRON, RETICCTPCT in the last 72 hours. Urine analysis: No results found for: COLORURINE, APPEARANCEUR, LABSPEC, PHURINE, GLUCOSEU, HGBUR, BILIRUBINUR, KETONESUR, PROTEINUR, UROBILINOGEN, NITRITE, LEUKOCYTESUR  Radiological Exams on Admission: Dg Chest 2 View  Result Date: 03/10/2017 CLINICAL DATA:  Chest pain off and on for the last week. Diaphoresis today. Pain radiating to the jaw. EXAM: CHEST  2 VIEW COMPARISON:  12/09/2014 FINDINGS: Normal heart size and pulmonary vascularity. No focal airspace disease or consolidation in the lungs. No blunting of costophrenic angles. No pneumothorax. Mediastinal contours appear intact. Calcification of the aorta. Degenerative changes in the spine. IMPRESSION: No active cardiopulmonary disease. Electronically Signed   By: Lucienne Capers M.D.   On: 03/10/2017 04:35    EKG: Independently reviewed. Sinus bradycardia with nonspecific ST changes  Assessment/Plan Active Problems:   Unstable angina (HCC)   NSTEMI (non-ST elevated myocardial infarction) (HCC)   HTN (hypertension)   Diabetes mellitus type 2  with complications (HCC)   GERD (gastroesophageal reflux disease)    1. NSTEMI and unstable angina. Seen by cardiology and felt that he will likely need cardiac catheterization. Plans are to transfer to [facility] in the morning for cardiac cath. Unfortunately there were no beds available today. He'll be continued on heparin infusion, aspirin, beta blocker, ARB, nitroglycerin paste. He's has not had any recurrence of pain since coming to the hospital. 2. Hypertension. Continue on ARB, amlodipine. Blood pressure is currently stable 3. Diabetes. Hold basal insulin. He is on a very small dose of 5 units daily. Since he'll be kept nothing by mouth after midnight, will hold off on basal insulin and just use sliding scale. 4. GERD. Continue PPI   DVT prophylaxis: heparin infusion Code Status: full code Family Communication: no family present Disposition Plan: transfer to [facility] for cardiac cath, hopefully in AM, sooner if deteriorates Consults called: cardiology Admission status: inpatient, tele   Gracelynne Benedict MD [facility] Pager [phone]  If 7PM-7AM, please contact night-coverage [email or web address] Password [email or web address]  03/10/2017, 6:50 PM

## 2017-03-10 NOTE — ED Triage Notes (Addendum)
Pt states he has been having CP on and off for the last week. Pt states he was diaphoretic today at work. Pt states this CP woke him up from his sleep tonight and radiates to his jaw. Pt took 1 nitro at home and CP was relieved.

## 2017-03-11 ENCOUNTER — Encounter (HOSPITAL_COMMUNITY): Payer: Self-pay | Admitting: Interventional Cardiology

## 2017-03-11 ENCOUNTER — Encounter (HOSPITAL_COMMUNITY)
Admission: EM | Disposition: A | Discharge status: Home / Self Care | Payer: Self-pay | Source: Home / Self Care | Attending: Internal Medicine

## 2017-03-11 DIAGNOSIS — E118 Type 2 diabetes mellitus with unspecified complications: Secondary | ICD-10-CM

## 2017-03-11 DIAGNOSIS — Z794 Long term (current) use of insulin: Secondary | ICD-10-CM

## 2017-03-11 HISTORY — PX: LEFT HEART CATH AND CORONARY ANGIOGRAPHY: CATH118249

## 2017-03-11 HISTORY — PX: ULTRASOUND GUIDANCE FOR VASCULAR ACCESS: SHX6516

## 2017-03-11 HISTORY — PX: CORONARY STENT INTERVENTION: CATH118234

## 2017-03-11 LAB — GLUCOSE, CAPILLARY
GLUCOSE-CAPILLARY: 93 mg/dL (ref 65–99)
GLUCOSE-CAPILLARY: 112 mg/dL — AB (ref 65–99)
Glucose-Capillary: 66 mg/dL (ref 65–99)
Glucose-Capillary: 160 mg/dL — ABNORMAL HIGH (ref 65–99)
Glucose-Capillary: 115 mg/dL — ABNORMAL HIGH (ref 65–99)

## 2017-03-11 LAB — CBC
HCT: 40.7 % (ref 39.0–52.0)
HEMATOCRIT: 40.6 % (ref 39.0–52.0)
HEMOGLOBIN: 13.4 g/dL (ref 13.0–17.0)
Hemoglobin: 13.5 g/dL (ref 13.0–17.0)
MCH: 30.9 pg (ref 26.0–34.0)
MCH: 30.5 pg (ref 26.0–34.0)
MCHC: 33.3 g/dL (ref 30.0–36.0)
MCHC: 32.9 g/dL (ref 30.0–36.0)
MCV: 92.9 fL (ref 78.0–100.0)
MCV: 92.7 fL (ref 78.0–100.0)
Platelets: 220 10*3/uL (ref 150–400)
Platelets: 227 10*3/uL (ref 150–400)
RBC: 4.37 MIL/uL (ref 4.22–5.81)
RBC: 4.39 MIL/uL (ref 4.22–5.81)
RDW: 13.9 % (ref 11.5–15.5)
RDW: 13.8 % (ref 11.5–15.5)
WBC: 5.4 10*3/uL (ref 4.0–10.5)
WBC: 4.6 10*3/uL (ref 4.0–10.5)

## 2017-03-11 LAB — HEPARIN LEVEL (UNFRACTIONATED): Heparin Unfractionated: 0.41 IU/mL (ref 0.30–0.70)

## 2017-03-11 LAB — CREATININE, SERUM: CREATININE: 0.79 mg/dL (ref 0.61–1.24)

## 2017-03-11 LAB — TROPONIN I
Troponin I: 0.96 ng/mL (ref <0.03)
Troponin I: 0.77 ng/mL (ref <0.03)

## 2017-03-11 LAB — POCT ACTIVATED CLOTTING TIME
ACTIVATED CLOTTING TIME: 257 s
Activated Clotting Time: 268 seconds
Activated Clotting Time: 329 seconds

## 2017-03-11 SURGERY — LEFT HEART CATH AND CORONARY ANGIOGRAPHY
Anesthesia: LOCAL

## 2017-03-11 MED ORDER — FENTANYL CITRATE (PF) 100 MCG/2ML IJ SOLN
INTRAMUSCULAR | Status: DC | PRN
  Administered 2017-03-11: 50 ug via INTRAVENOUS

## 2017-03-11 MED ORDER — SODIUM CHLORIDE 0.9% FLUSH
3.0000 mL | Freq: Two times a day (BID) | INTRAVENOUS | Status: DC
Start: 2017-03-11 — End: 2017-03-12
  Administered 2017-03-11 (×2): 3 mL via INTRAVENOUS

## 2017-03-11 MED ORDER — ATORVASTATIN CALCIUM 80 MG PO TABS
80.0000 mg | ORAL_TABLET | Freq: Every day | ORAL | Status: DC
Start: 2017-03-11 — End: 2017-03-12
  Administered 2017-03-11: 80 mg via ORAL
  Filled 2017-03-11: qty 1

## 2017-03-11 MED ORDER — ONDANSETRON HCL 4 MG/2ML IJ SOLN: 4.0000 mg | Freq: Four times a day (QID) | INTRAMUSCULAR | Status: DC | PRN

## 2017-03-11 MED ORDER — NITROGLYCERIN 1 MG/10 ML FOR IR/CATH LAB
INTRA_ARTERIAL | Status: DC | PRN
  Administered 2017-03-11: 200 ug via INTRACORONARY

## 2017-03-11 MED ORDER — LIDOCAINE HCL (PF) 1 % IJ SOLN
INTRAMUSCULAR | Status: DC | PRN
  Administered 2017-03-11: 2 mL

## 2017-03-11 MED ORDER — IOPAMIDOL (ISOVUE-370) INJECTION 76%
INTRAVENOUS | Status: DC | PRN
  Administered 2017-03-11: 220 mL via INTRAVENOUS

## 2017-03-11 MED ORDER — TICAGRELOR 90 MG PO TABS
90.0000 mg | ORAL_TABLET | Freq: Two times a day (BID) | ORAL | Status: DC
  Administered 2017-03-11 – 2017-03-12 (×2): 90 mg via ORAL
  Filled 2017-03-11 (×2): qty 1

## 2017-03-11 MED ORDER — HEPARIN SODIUM (PORCINE) 1000 UNIT/ML IJ SOLN
INTRAMUSCULAR | Status: DC | PRN
  Administered 2017-03-11: 7500 [IU] via INTRAVENOUS
  Administered 2017-03-11: 5000 [IU] via INTRAVENOUS
  Administered 2017-03-11: 2500 [IU] via INTRAVENOUS
  Administered 2017-03-11: 2000 [IU] via INTRAVENOUS

## 2017-03-11 MED ORDER — SODIUM CHLORIDE 0.9 % IV SOLN
INTRAVENOUS | Status: AC | PRN
  Administered 2017-03-11: 107 mL/h via INTRAVENOUS

## 2017-03-11 MED ORDER — SODIUM CHLORIDE 0.9 % IV SOLN: 250.0000 mL | INTRAVENOUS | Status: DC | PRN

## 2017-03-11 MED ORDER — SODIUM CHLORIDE 0.9 % IV SOLN
INTRAVENOUS | Status: AC
  Administered 2017-03-11: 15:00:00 via INTRAVENOUS

## 2017-03-11 MED ORDER — HEPARIN (PORCINE) IN NACL 2-0.9 UNIT/ML-% IJ SOLN
INTRAMUSCULAR | Status: AC | PRN
  Administered 2017-03-11: 1000 mL
  Administered 2017-03-11: 500 mL

## 2017-03-11 MED ORDER — TICAGRELOR 90 MG PO TABS
ORAL_TABLET | ORAL | Status: DC | PRN
  Administered 2017-03-11: 180 mg via ORAL

## 2017-03-11 MED ORDER — HYDRALAZINE HCL 20 MG/ML IJ SOLN: 5.0000 mg | INTRAMUSCULAR | Status: AC | PRN

## 2017-03-11 MED ORDER — MIDAZOLAM HCL 2 MG/2ML IJ SOLN
INTRAMUSCULAR | Status: DC | PRN
  Administered 2017-03-11 (×2): 1 mg via INTRAVENOUS

## 2017-03-11 MED ORDER — LABETALOL HCL 5 MG/ML IV SOLN: 10.0000 mg | INTRAVENOUS | Status: AC | PRN

## 2017-03-11 MED ORDER — HEPARIN SODIUM (PORCINE) 5000 UNIT/ML IJ SOLN
5000.0000 [IU] | Freq: Three times a day (TID) | INTRAMUSCULAR | Status: DC
  Administered 2017-03-11: 5000 [IU] via SUBCUTANEOUS
  Filled 2017-03-11: qty 1

## 2017-03-11 MED ORDER — VERAPAMIL HCL 2.5 MG/ML IV SOLN
INTRAVENOUS | Status: DC | PRN
  Administered 2017-03-11: 10 mL via INTRA_ARTERIAL

## 2017-03-11 MED ORDER — ASPIRIN 81 MG PO CHEW
81.0000 mg | CHEWABLE_TABLET | Freq: Every day | ORAL | Status: DC
  Administered 2017-03-12: 81 mg via ORAL
  Filled 2017-03-11: qty 1

## 2017-03-11 MED ORDER — ACETAMINOPHEN 325 MG PO TABS: 650.0000 mg | ORAL_TABLET | ORAL | Status: DC | PRN

## 2017-03-11 MED ORDER — ANGIOPLASTY BOOK
Freq: Once | Status: AC
  Administered 2017-03-11
  Filled 2017-03-11: qty 1

## 2017-03-11 MED ORDER — SODIUM CHLORIDE 0.9% FLUSH: 3.0000 mL | INTRAVENOUS | Status: DC | PRN

## 2017-03-11 MED ORDER — OXYCODONE HCL 5 MG PO TABS
5.0000 mg | ORAL_TABLET | Freq: Four times a day (QID) | ORAL | Status: DC | PRN
  Administered 2017-03-11: 5 mg via ORAL
  Filled 2017-03-11 (×2): qty 1

## 2017-03-11 SURGICAL SUPPLY — 21 items
BALLN EMERGE MR 2.25X12 (BALLOONS) ×3
BALLN SAPPHIRE 3.0X12 (BALLOONS) ×3
BALLOON EMERGE MR 2.25X12 (BALLOONS) ×2 IMPLANT
BALLOON SAPPHIRE 3.0X12 (BALLOONS) ×2 IMPLANT
CATH INFINITI 5 FR JL3.5 (CATHETERS) ×3 IMPLANT
CATH INFINITI JR4 5F (CATHETERS) ×3 IMPLANT
CATH VISTA GUIDE 6FR XBRCA (CATHETERS) ×3 IMPLANT
COVER PRB 48X5XTLSCP FOLD TPE (BAG) ×2 IMPLANT
COVER PROBE 5X48 (BAG) ×1
DEVICE RAD COMP TR BAND LRG (VASCULAR PRODUCTS) ×3 IMPLANT
GLIDESHEATH SLEND A-KIT 6F 22G (SHEATH) ×3 IMPLANT
GUIDEWIRE INQWIRE 1.5J.035X260 (WIRE) ×2 IMPLANT
INQWIRE 1.5J .035X260CM (WIRE) ×3
KIT ENCORE 26 ADVANTAGE (KITS) ×3 IMPLANT
KIT HEART LEFT (KITS) ×3 IMPLANT
PACK CARDIAC CATHETERIZATION (CUSTOM PROCEDURE TRAY) ×3 IMPLANT
STENT RESOLUTE ONYX 2.25X15 (Permanent Stent) ×3 IMPLANT
STENT RESOLUTE ONYX 3.5X18 (Permanent Stent) ×3 IMPLANT
TRANSDUCER W/STOPCOCK (MISCELLANEOUS) ×3 IMPLANT
TUBING CIL FLEX 10 FLL-RA (TUBING) ×3 IMPLANT
WIRE ASAHI PROWATER 180CM (WIRE) ×6 IMPLANT

## 2017-03-11 NOTE — Progress Notes (Signed)
Pt is significantly brady while sleeping. HR ranging 30's-40's but then quickly increases to mid 50's-60's. Pt is asymptomatic and states he has sleep apnea and has been told his HR decreases at times.  Will continue to monitor

## 2017-03-11 NOTE — Interval H&P Note (Signed)
History and Physical Interval Note:  03/11/2017 Cath Lab Visit (complete for each Cath Lab visit)  Clinical Evaluation Leading to the Procedure:   ACS: Yes.    Non-ACS:    Anginal Classification: CCS Garrett  Anti-ischemic medical therapy: Minimal Therapy (1 class of medications)  Non-Invasive Test Results: No non-invasive testing performed  Prior CABG: No previous CABG       10:38 AM  Dwayne Garrett  has presented today for surgery, with the diagnosis of nstemi  The various methods of treatment have been discussed with the patient and family. After consideration of risks, benefits and other options for treatment, the patient has consented to  Procedure(s): LEFT HEART CATH AND CORONARY ANGIOGRAPHY (N/A) as a surgical intervention .  The patient's history has been reviewed, patient examined, no change in status, stable for surgery.  I have reviewed the patient's chart and labs.  Questions were answered to the patient's satisfaction.     Dwayne Garrett

## 2017-03-11 NOTE — Progress Notes (Signed)
ANTICOAGULATION CONSULT NOTE - Follow Up Consult  Pharmacy Consult for Heparin Indication: chest pain/ACS  No Known Allergies  Patient Measurements: Height: 5' 9"  (175.3 cm) Weight: 227 lb 8.2 oz (103.2 kg) IBW/kg (Calculated) : 70.7 HEPARIN DW (KG): 92.8   Vital Signs: Temp: 98.2 F (36.8 C) (09/21 0740) Temp Source: Oral (09/21 0740) BP: 131/82 (09/21 0900) Pulse Rate: 70 (09/21 0900)  Labs:  Recent Labs  03/10/17 0400  03/10/17 1310 03/10/17 1848 03/10/17 2053 03/11/17 0029 03/11/17 0659  HGB 14.2  --   --   --   --   --  13.5  HCT 42.1  --   --   --   --   --  40.6  PLT 246  --   --   --   --   --  220  APTT 29  --   --   --   --   --   --   LABPROT 13.3  --   --   --   --   --   --   INR 1.02  --   --   --   --   --   --   HEPARINUNFRC  --   --  0.16*  --  0.29*  --  0.41  CREATININE 0.75  --   --   --   --   --   --   TROPONINI 0.04*  < > 1.50* 1.07*  --  0.96* 0.77*  < > = values in this interval not displayed. Estimated Creatinine Clearance: 117.7 mL/min (by C-G formula based on SCr of 0.75 mg/dL).  Medications:  Prescriptions Prior to Admission  Medication Sig Dispense Refill Last Dose  . amLODipine (NORVASC) 5 MG tablet Take 1 tablet by mouth daily.   03/09/2017 at Unknown time  . aspirin 325 MG EC tablet Take 325 mg by mouth daily.    03/09/2017 at Unknown time  . diclofenac (VOLTAREN) 75 MG EC tablet Take 75 mg by mouth 2 (two) times daily.   03/09/2017 at Unknown time  . Insulin Degludec (TRESIBA FLEXTOUCH [place]) Inject 5 Units into the skin daily.   03/09/2017 at Unknown time  . metFORMIN (GLUCOPHAGE) 500 MG tablet Take 1,000 mg by mouth 2 (two) times daily.   3 03/09/2017 at Unknown time  . metoprolol succinate (TOPROL-XL) 25 MG 24 hr tablet Take 25 mg by mouth daily.  2 03/09/2017 at Unknown time  . Multiple Vitamin (MULTIVITAMIN WITH MINERALS) TABS tablet Take 1 tablet by mouth daily.   03/09/2017 at Unknown time  . NITROSTAT 0.4 MG SL tablet PLACE 1 TABLET  UNDER TONGUE EVERY FIVE MINUTES AS DIRECTED 25 tablet 3 03/10/2017 at 0250  . Omega-3 Fatty Acids (FISH OIL) 1000 MG CAPS Take 3,000 mg by mouth daily.   03/09/2017 at Unknown time  . omeprazole (PRILOSEC) 40 MG capsule TAKE 1 CAPSULE (40 MG TOTAL) BY MOUTH DAILY. 90 capsule 3 03/09/2017 at Unknown time  . ONETOUCH VERIO test strip TEST DAILY E11.9  3 03/09/2017 at Unknown time  . sildenafil (REVATIO) 20 MG tablet Take 100 mg by mouth daily as needed (erectile dysfunction).   0 Past Week at Unknown time  . valsartan-hydrochlorothiazide (DIOVAN-HCT) 320-12.5 MG per tablet Take 1 tablet by mouth daily.     03/09/2017 at Unknown time  . amLODipine (NORVASC) 5 MG tablet Take 1 tablet (5 mg total) by mouth daily. 90 tablet 3 Taking    Reviewed - not  on oral anticoagulant PTA  Assessment: Hep level now therapeutic.  Plan [facility] when bed available  Goal of Therapy:  Heparin level 0.3-0.7 units/ml Monitor platelets by anticoagulation protocol: Yes   Plan:  Cont heparin infusion at 1450 units/hr Check anti-Xa level daily while on heparin Continue to monitor H&H and platelets  Dwayne Garrett 03/11/2017,9:54 AM

## 2017-03-11 NOTE — Care Management Note (Addendum)
Case Management Note  Patient Details  Name: Dwayne Garrett MRN: [id number] Date of Birth: 12/21/1955  Subjective/Objective:    From home, s/p coronary stent intervention, will be on brilinta and asa, NCM awaiting benefit check,  Patient goes to [place] , PCP is Annetta Maw, fax number is [phone]. Will need to fax dc summary to PCP. NCM informed [facility] to [facility] [address] to [facility] his first fill of brilinta for $5 with the coupon.               Action/Plan: NCM awaiting benefit check.  Expected Discharge Date:                  Expected Discharge Plan:  Home/Self Care  In-House Referral:     Discharge planning Services  CM Consult  Post Acute Care Choice:    Choice offered to:     DME Arranged:    DME Agency:     HH Arranged:    [place] Agency:     Status of Service:  Completed, signed off  If discussed at [employer] of Stay Meetings, dates discussed:    Additional Comments:  Zenon Mayo, RN 03/11/2017, 1:56 PM

## 2017-03-11 NOTE — Progress Notes (Signed)
PT TRANSFERRED TO [place] CARDIAC CATH UNIT VIA [id number]. VSS. DENIES ANY DISCOMFORT.PT NPO. HAS HAS AM ASA.

## 2017-03-11 NOTE — Progress Notes (Signed)
Progress Note  Patient Name: Dwayne Garrett Date of Encounter: 03/11/2017  Primary Cardiologist: Dr. Satira Sark  Subjective   No active chest pain or shortness of breath. No palpitations. No nausea.  Inpatient Medications    Scheduled Meds: . amLODipine  5 mg Oral Daily  . aspirin  325 mg Oral Daily  . insulin aspart  0-5 Units Subcutaneous QHS  . insulin aspart  0-9 Units Subcutaneous TID WC  . irbesartan  300 mg Oral Daily  . metoprolol succinate  25 mg Oral Daily  . multivitamin with minerals  1 tablet Oral Daily  . nitroGLYCERIN  1 inch Topical Q6H  . omega-3 acid ethyl esters  2 g Oral Daily  . pantoprazole  40 mg Oral Daily  . sodium chloride flush  3 mL Intravenous Q12H  . sodium chloride flush  3 mL Intravenous Q12H  . sodium chloride flush  3 mL Intravenous Q12H   Continuous Infusions: . sodium chloride    . sodium chloride    . sodium chloride    . sodium chloride    . sodium chloride 1 mL/kg/hr (03/11/17 0710)  . heparin 1,450 Units/hr (03/10/17 2248)   PRN Meds: sodium chloride, sodium chloride, sodium chloride, acetaminophen **OR** acetaminophen, nitroGLYCERIN, ondansetron **OR** ondansetron (ZOFRAN) IV, oxyCODONE, sodium chloride flush, sodium chloride flush, sodium chloride flush   Vital Signs    Vitals:   03/11/17 0530 03/11/17 0700 03/11/17 0740 03/11/17 0800  BP: 127/70 114/81  (!) 140/91  Pulse: 65 67  69  Resp: (!) 21 (!) 24  16  Temp:   98.2 F (36.8 C)   TempSrc:   Oral   SpO2: 97% 99%  97%  Weight:      Height:        Intake/Output Summary (Last 24 hours) at 03/11/17 0901 Last data filed at 03/11/17 0500  Gross per 24 hour  Intake           608.09 ml  Output              900 ml  Net          -291.91 ml   Filed Weights   03/10/17 0343 03/10/17 1653 03/11/17 0500  Weight: 236 lb (107 kg) 227 lb 1.2 oz (103 kg) 227 lb 8.2 oz (103.2 kg)    Telemetry    Normal sinus rhythm. Nocturnal bradycardia into the 40s and 50s.  No pauses. Personally reviewed.  Physical Exam   GEN: Obese male.No acute distress.   Neck: No JVD. Cardiac: RRR, soft systolic murmur, no gallop.  Respiratory: Nonlabored. Clear to auscultation bilaterally. GI: Soft, nontender, bowel sounds present. MS: No edema; No deformity. Neuro:  Nonfocal. Psych: Alert and oriented x 3. Normal affect.  Labs    Chemistry Recent Labs Lab 03/10/17 0400  NA 138  K 3.7  CL 105  CO2 25  GLUCOSE 108*  BUN 20  CREATININE 0.75  CALCIUM 9.2  GFRNONAA >60  GFRAA >60  ANIONGAP 8     Hematology Recent Labs Lab 03/10/17 0400 03/11/17 0659  WBC 8.2 5.4  RBC 4.60 4.37  HGB 14.2 13.5  HCT 42.1 40.6  MCV 91.5 92.9  MCH 30.9 30.9  MCHC 33.7 33.3  RDW 13.7 13.9  PLT 246 220    Cardiac Enzymes Recent Labs Lab 03/10/17 1310 03/10/17 1848 03/11/17 0029 03/11/17 0659  TROPONINI 1.50* 1.07* 0.96* 0.77*    Recent Labs Lab 03/10/17 0405  TROPIPOC 0.02  Radiology    Dg Chest 2 View  Result Date: 03/10/2017 CLINICAL DATA:  Chest pain off and on for the last week. Diaphoresis today. Pain radiating to the jaw. EXAM: CHEST  2 VIEW COMPARISON:  12/09/2014 FINDINGS: Normal heart size and pulmonary vascularity. No focal airspace disease or consolidation in the lungs. No blunting of costophrenic angles. No pneumothorax. Mediastinal contours appear intact. Calcification of the aorta. Degenerative changes in the spine. IMPRESSION: No active cardiopulmonary disease. Electronically Signed   By: Lucienne Capers M.D.   On: 03/10/2017 04:35    Cardiac Studies   Cardiac catheterization 12/11/2014:  Prox RCA lesion, 10% stenosed. The lesion was previously treated with a drug-eluting stent greater than two years ago.  Dist RCA lesion, 20% stenosed.  Prox Cx lesion, 30% stenosed.  Ost LAD to Prox LAD lesion, 40% stenosed.  The left ventricular systolic function is normal.  Patent stent Diagonal  LM lesion, 40% stenosed.   1. 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 Mild to moderate left main stenosis, unchanged from last cath in 2011 3. Normal LV systolic function   Patient Profile     61 y.o. male with a history of previous stent interventions to the diagonal and RCA, chronically abnormal LFTs with liver biopsy consistent with cirrhosis, essential hypertension, OSA, now presenting with unstable angina and enzymatic evidence of NSTEMI with peak troponin I 1.5.  Assessment & Plan    1. NSTEMI, peak troponin I 1.5. Currently chest pain-free on heparin.  2. CAD with previous stent interventions to the diagonal and RCA, last cardiac catheterization in 2016 revealed patent stent sites with mild to moderate left main stenosis that was managed medically. LVEF was normal at angiography at that time.  3. History of OSA, nocturnal bradycardia is likely secondary to this.  4. Essential hypertension, currently on Norvasc, Avapro, and Toprol-XL.  5. History of hyperlipidemia, no recent lipid panel. He is not on statin therapy with history of chronically abnormal LFTs.  Discussed with patient and family members in room. Plan is for transfer to cardiac catheterization lab today at [facility] for diagnostic cardiac catheterization and potential revascularization procedure. Continue aspirin, heparin, Avapro, Toprol-XL, nitroglycerin paste, sliding scale insulin and Lovaza. Needs follow-up lipid panel and echocardiogram at [facility].   Signed, Rozann Lesches, MD  03/11/2017, 9:01 AM

## 2017-03-11 NOTE — H&P (View-Only) (Signed)
Progress Note  Patient Name: Dwayne Garrett Date of Encounter: 03/11/2017  Primary Cardiologist: Dr. Satira Sark  Subjective   No active chest pain or shortness of breath. No palpitations. No nausea.  Inpatient Medications    Scheduled Meds: . amLODipine  5 mg Oral Daily  . aspirin  325 mg Oral Daily  . insulin aspart  0-5 Units Subcutaneous QHS  . insulin aspart  0-9 Units Subcutaneous TID WC  . irbesartan  300 mg Oral Daily  . metoprolol succinate  25 mg Oral Daily  . multivitamin with minerals  1 tablet Oral Daily  . nitroGLYCERIN  1 inch Topical Q6H  . omega-3 acid ethyl esters  2 g Oral Daily  . pantoprazole  40 mg Oral Daily  . sodium chloride flush  3 mL Intravenous Q12H  . sodium chloride flush  3 mL Intravenous Q12H  . sodium chloride flush  3 mL Intravenous Q12H   Continuous Infusions: . sodium chloride    . sodium chloride    . sodium chloride    . sodium chloride    . sodium chloride 1 mL/kg/hr (03/11/17 0710)  . heparin 1,450 Units/hr (03/10/17 2248)   PRN Meds: sodium chloride, sodium chloride, sodium chloride, acetaminophen **OR** acetaminophen, nitroGLYCERIN, ondansetron **OR** ondansetron (ZOFRAN) IV, oxyCODONE, sodium chloride flush, sodium chloride flush, sodium chloride flush   Vital Signs    Vitals:   03/11/17 0530 03/11/17 0700 03/11/17 0740 03/11/17 0800  BP: 127/70 114/81  (!) 140/91  Pulse: 65 67  69  Resp: (!) 21 (!) 24  16  Temp:   98.2 F (36.8 C)   TempSrc:   Oral   SpO2: 97% 99%  97%  Weight:      Height:        Intake/Output Summary (Last 24 hours) at 03/11/17 0901 Last data filed at 03/11/17 0500  Gross per 24 hour  Intake           608.09 ml  Output              900 ml  Net          -291.91 ml   Filed Weights   03/10/17 0343 03/10/17 1653 03/11/17 0500  Weight: 236 lb (107 kg) 227 lb 1.2 oz (103 kg) 227 lb 8.2 oz (103.2 kg)    Telemetry    Normal sinus rhythm. Nocturnal bradycardia into the 40s and 50s.  No pauses. Personally reviewed.  Physical Exam   GEN: Obese male.No acute distress.   Neck: No JVD. Cardiac: RRR, soft systolic murmur, no gallop.  Respiratory: Nonlabored. Clear to auscultation bilaterally. GI: Soft, nontender, bowel sounds present. MS: No edema; No deformity. Neuro:  Nonfocal. Psych: Alert and oriented x 3. Normal affect.  Labs    Chemistry Recent Labs Lab 03/10/17 0400  NA 138  K 3.7  CL 105  CO2 25  GLUCOSE 108*  BUN 20  CREATININE 0.75  CALCIUM 9.2  GFRNONAA >60  GFRAA >60  ANIONGAP 8     Hematology Recent Labs Lab 03/10/17 0400 03/11/17 0659  WBC 8.2 5.4  RBC 4.60 4.37  HGB 14.2 13.5  HCT 42.1 40.6  MCV 91.5 92.9  MCH 30.9 30.9  MCHC 33.7 33.3  RDW 13.7 13.9  PLT 246 220    Cardiac Enzymes Recent Labs Lab 03/10/17 1310 03/10/17 1848 03/11/17 0029 03/11/17 0659  TROPONINI 1.50* 1.07* 0.96* 0.77*    Recent Labs Lab 03/10/17 0405  TROPIPOC 0.02  Radiology    Dg Chest 2 View  Result Date: 03/10/2017 CLINICAL DATA:  Chest pain off and on for the last week. Diaphoresis today. Pain radiating to the jaw. EXAM: CHEST  2 VIEW COMPARISON:  12/09/2014 FINDINGS: Normal heart size and pulmonary vascularity. No focal airspace disease or consolidation in the lungs. No blunting of costophrenic angles. No pneumothorax. Mediastinal contours appear intact. Calcification of the aorta. Degenerative changes in the spine. IMPRESSION: No active cardiopulmonary disease. Electronically Signed   By: Lucienne Capers M.D.   On: 03/10/2017 04:35    Cardiac Studies   Cardiac catheterization 12/11/2014:  Prox RCA lesion, 10% stenosed. The lesion was previously treated with a drug-eluting stent greater than two years ago.  Dist RCA lesion, 20% stenosed.  Prox Cx lesion, 30% stenosed.  Ost LAD to Prox LAD lesion, 40% stenosed.  The left ventricular systolic function is normal.  Patent stent Diagonal  LM lesion, 40% stenosed.   1. 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 Mild to moderate left main stenosis, unchanged from last cath in 2011 3. Normal LV systolic function   Patient Profile     61 y.o. male with a history of previous stent interventions to the diagonal and RCA, chronically abnormal LFTs with liver biopsy consistent with cirrhosis, essential hypertension, OSA, now presenting with unstable angina and enzymatic evidence of NSTEMI with peak troponin I 1.5.  Assessment & Plan    1. NSTEMI, peak troponin I 1.5. Currently chest pain-free on heparin.  2. CAD with previous stent interventions to the diagonal and RCA, last cardiac catheterization in 2016 revealed patent stent sites with mild to moderate left main stenosis that was managed medically. LVEF was normal at angiography at that time.  3. History of OSA, nocturnal bradycardia is likely secondary to this.  4. Essential hypertension, currently on Norvasc, Avapro, and Toprol-XL.  5. History of hyperlipidemia, no recent lipid panel. He is not on statin therapy with history of chronically abnormal LFTs.  Discussed with patient and family members in room. Plan is for transfer to cardiac catheterization lab today at [facility] for diagnostic cardiac catheterization and potential revascularization procedure. Continue aspirin, heparin, Avapro, Toprol-XL, nitroglycerin paste, sliding scale insulin and Lovaza. Needs follow-up lipid panel and echocardiogram at [facility].   Signed, Rozann Lesches, MD  03/11/2017, 9:01 AM

## 2017-03-11 NOTE — Progress Notes (Signed)
PROGRESS NOTE    Dwayne Garrett  YIF:[id number] DOB: 1955-10-15 DOA: 03/10/2017 PCP: Annetta Maw, MD    Brief Narrative:  61 y/o male with known history of CAD, presented with chest pain and found to have NSTEMI. Seen by cardiology and plans are for transfer to [facility] for cardiac cath.   Assessment & Plan:   Active Problems:   Unstable angina (HCC)   NSTEMI (non-ST elevated myocardial infarction) ([place])   HTN (hypertension)   Diabetes mellitus type 2 with complications (HCC)   GERD (gastroesophageal reflux disease)   1. NSTEMI and unstable angina. Troponin peak at 1.5. Seen by cardiology and felt that he will need cardiac catheterization. Plans are to transfer to [facility] today for cardiac cath. He'll be continued on heparin infusion, aspirin, beta blocker, ARB, nitroglycerin paste. He's has not had any recurrence of pain since coming to the hospital. 2. Hypertension. Continue on ARB, amlodipine. Blood pressure is currently stable 3. Diabetes. Hold basal insulin. He is on a very small dose of 5 units daily. Since he'll be kept nothing by mouth after midnight, will hold off on basal insulin and just use sliding scale. 4. GERD. Continue PPI   DVT prophylaxis: heparin infusion Code Status: full code Family Communication: discussed with family at the bedside Disposition Plan: transfer to [facility] for cardiac cath.   Consultants:   cardiology  Procedures:     Antimicrobials:     Subjective: No chest pain, no shortness of breath  Objective: Vitals:   03/11/17 0700 03/11/17 0740 03/11/17 0800 03/11/17 0900  BP: 114/81  (!) 140/91 131/82  Pulse: 67  69 70  Resp: (!) 24  16 16   Temp:  98.2 F (36.8 C)    TempSrc:  Oral    SpO2: 99%  97% 96%  Weight:      Height:        Intake/Output Summary (Last 24 hours) at 03/11/17 0930 Last data filed at 03/11/17 0900  Gross per 24 hour  Intake           865.59 ml  Output             1200 ml  Net          -334.41 ml    Filed Weights   03/10/17 0343 03/10/17 1653 03/11/17 0500  Weight: 107 kg (236 lb) 103 kg (227 lb 1.2 oz) 103.2 kg (227 lb 8.2 oz)    Examination:  General exam: Appears calm and comfortable  Respiratory system: Clear to auscultation. Respiratory effort normal. Cardiovascular system: S1 & S2 heard, RRR. No JVD, murmurs, rubs, gallops or clicks. No pedal edema. Gastrointestinal system: Abdomen is nondistended, soft and nontender. No organomegaly or masses felt. Normal bowel sounds heard. Central nervous system: Alert and oriented. No focal neurological deficits. Extremities: Symmetric 5 x 5 power. Skin: No rashes, lesions or ulcers Psychiatry: Judgement and insight appear normal. Mood & affect appropriate.     Data Reviewed: I have personally reviewed following labs and imaging studies  CBC:  Recent Labs Lab 03/10/17 0400 03/11/17 0659  WBC 8.2 5.4  HGB 14.2 13.5  HCT 42.1 40.6  MCV 91.5 92.9  PLT 246 867   Basic Metabolic Panel:  Recent Labs Lab 03/10/17 0400  NA 138  K 3.7  CL 105  CO2 25  GLUCOSE 108*  BUN 20  CREATININE 0.75  CALCIUM 9.2   GFR: Estimated Creatinine Clearance: 117.7 mL/min (by C-G formula based on SCr of 0.75 mg/dL).  Liver Function Tests: No results for input(s): AST, ALT, ALKPHOS, BILITOT, PROT, ALBUMIN in the last 168 hours. No results for input(s): LIPASE, AMYLASE in the last 168 hours. No results for input(s): AMMONIA in the last 168 hours. Coagulation Profile:  Recent Labs Lab 03/10/17 0400  INR 1.02   Cardiac Enzymes:  Recent Labs Lab 03/10/17 0900 03/10/17 1310 03/10/17 1848 03/11/17 0029 03/11/17 0659  TROPONINI 0.50* 1.50* 1.07* 0.96* 0.77*   BNP (last 3 results) No results for input(s): PROBNP in the last 8760 hours. HbA1C: No results for input(s): HGBA1C in the last 72 hours. CBG:  Recent Labs Lab 03/10/17 2120 03/11/17 0736  GLUCAP 130* 93   Lipid Profile: No results for input(s): CHOL, HDL, LDLCALC,  TRIG, CHOLHDL, LDLDIRECT in the last 72 hours. Thyroid Function Tests: No results for input(s): TSH, T4TOTAL, FREET4, T3FREE, THYROIDAB in the last 72 hours. Anemia Panel: No results for input(s): VITAMINB12, FOLATE, FERRITIN, TIBC, IRON, RETICCTPCT in the last 72 hours. Sepsis Labs: No results for input(s): PROCALCITON, LATICACIDVEN in the last 168 hours.  Recent Results (from the past 240 hour(s))  MRSA PCR Screening     Status: None   Collection Time: 03/10/17  5:50 PM  Result Value Ref Range Status   MRSA by PCR NEGATIVE NEGATIVE Final    Comment:        The GeneXpert MRSA Assay (FDA approved for NASAL specimens only), is one component of a comprehensive MRSA colonization surveillance program. It is not intended to diagnose MRSA infection nor to guide or monitor treatment for MRSA infections.          Radiology Studies: Dg Chest 2 View  Result Date: 03/10/2017 CLINICAL DATA:  Chest pain off and on for the last week. Diaphoresis today. Pain radiating to the jaw. EXAM: CHEST  2 VIEW COMPARISON:  12/09/2014 FINDINGS: Normal heart size and pulmonary vascularity. No focal airspace disease or consolidation in the lungs. No blunting of costophrenic angles. No pneumothorax. Mediastinal contours appear intact. Calcification of the aorta. Degenerative changes in the spine. IMPRESSION: No active cardiopulmonary disease. Electronically Signed   By: Lucienne Capers M.D.   On: 03/10/2017 04:35        Scheduled Meds: . amLODipine  5 mg Oral Daily  . aspirin  325 mg Oral Daily  . insulin aspart  0-5 Units Subcutaneous QHS  . insulin aspart  0-9 Units Subcutaneous TID WC  . irbesartan  300 mg Oral Daily  . metoprolol succinate  25 mg Oral Daily  . multivitamin with minerals  1 tablet Oral Daily  . nitroGLYCERIN  1 inch Topical Q6H  . omega-3 acid ethyl esters  2 g Oral Daily  . pantoprazole  40 mg Oral Daily  . sodium chloride flush  3 mL Intravenous Q12H  . sodium chloride  flush  3 mL Intravenous Q12H  . sodium chloride flush  3 mL Intravenous Q12H   Continuous Infusions: . sodium chloride    . sodium chloride    . sodium chloride    . sodium chloride    . sodium chloride 1 mL/kg/hr (03/11/17 0900)  . heparin 1,450 Units/hr (03/11/17 0900)     LOS: 1 day    Time spent: 70mns    MEMON,JEHANZEB, MD [facility] Pager [phone] If 7PM-7AM, please contact night-coverage [email or web address] Password [email or web address] 03/11/2017, 9:30 AM

## 2017-03-11 NOTE — Progress Notes (Signed)
3. SW  SHAR[place], @ [facility] # [phone]    BRILINTA   90 MG BID   COVER- YES  CO-PAY- $ 89.72  PRIOR APPROVAL- NO  DEDUCTIBLE MET- YES   PHARMACY : [facility]

## 2017-03-12 ENCOUNTER — Encounter (HOSPITAL_COMMUNITY): Payer: Self-pay | Admitting: Cardiology

## 2017-03-12 LAB — CBC
HCT: 39.8 % (ref 39.0–52.0)
HEMOGLOBIN: 13 g/dL (ref 13.0–17.0)
MCH: 30.2 pg (ref 26.0–34.0)
MCHC: 32.7 g/dL (ref 30.0–36.0)
MCV: 92.3 fL (ref 78.0–100.0)
PLATELETS: 227 10*3/uL (ref 150–400)
RBC: 4.31 MIL/uL (ref 4.22–5.81)
RDW: 13.8 % (ref 11.5–15.5)
WBC: 5.5 10*3/uL (ref 4.0–10.5)

## 2017-03-12 LAB — BASIC METABOLIC PANEL
Anion gap: 5 (ref 5–15)
BUN: 11 mg/dL (ref 6–20)
CALCIUM: 8.8 mg/dL — AB (ref 8.9–10.3)
CO2: 25 mmol/L (ref 22–32)
CREATININE: 0.7 mg/dL (ref 0.61–1.24)
Chloride: 108 mmol/L (ref 101–111)
GFR calc Af Amer: >60 mL/min (ref >60)
Glucose, Bld: 108 mg/dL — ABNORMAL HIGH (ref 65–99)
Potassium: 4 mmol/L (ref 3.5–5.1)
SODIUM: 138 mmol/L (ref 135–145)

## 2017-03-12 LAB — GLUCOSE, CAPILLARY: GLUCOSE-CAPILLARY: 109 mg/dL — AB (ref 65–99)

## 2017-03-12 LAB — HEPARIN LEVEL (UNFRACTIONATED)

## 2017-03-12 LAB — HIV ANTIBODY (ROUTINE TESTING W REFLEX): HIV Screen 4th Generation wRfx: NONREACTIVE

## 2017-03-12 MED ORDER — ATORVASTATIN CALCIUM 80 MG PO TABS: 80.0000 mg | ORAL_TABLET | Freq: Every day | ORAL | 11 refills | Status: DC

## 2017-03-12 MED ORDER — AMLODIPINE BESYLATE 5 MG PO TABS: 5.0000 mg | ORAL_TABLET | Freq: Two times a day (BID) | ORAL | 5 refills | Status: DC

## 2017-03-12 MED ORDER — TICAGRELOR 90 MG PO TABS: 90.0000 mg | ORAL_TABLET | Freq: Two times a day (BID) | ORAL | 0 refills | Status: DC

## 2017-03-12 MED ORDER — TICAGRELOR 90 MG PO TABS: 90.0000 mg | ORAL_TABLET | Freq: Two times a day (BID) | ORAL | 11 refills | Status: DC

## 2017-03-12 MED ORDER — ASPIRIN 81 MG PO CHEW: 81.0000 mg | CHEWABLE_TABLET | Freq: Every day | ORAL | 11 refills | Status: DC

## 2017-03-12 NOTE — Discharge Summary (Signed)
Discharge Summary    Patient ID: Dwayne Garrett,  MRN: [id number], DOB/AGE: [birth year] 61 y.o.  Admit date: 03/10/2017 Discharge date: 03/12/2017  Primary Care Provider: Annetta Maw Primary Cardiologist: Dr. Johnny Bridge  Discharge Diagnoses    Principal Problem:   NSTEMI (non-ST elevated myocardial infarction) [facility]) Active Problems:   Unstable angina ([place])   HTN (hypertension)   Diabetes mellitus type 2 with complications (HCC)   GERD (gastroesophageal reflux disease)   Allergies No Known Allergies  Diagnostic Studies/Procedures    CORONARY STENT INTERVENTION  LEFT HEART CATH AND CORONARY ANGIOGRAPHY   03/11/2017  Conclusion    Unstable angina/non-ST elevation myocardial infarction presentation.  The previously stented proximal to mid RCA is widely patent  De novo 95-99% distal RCA before the origin of the PDA.  De novo 95% stenosis in the third left ventricular branch of the right coronary artery.  Moderate obstruction in the trunk of the left main, 40%. This is unchanged from prior angiogram.  Moderate eccentric 40-50% proximal to mid LAD. Diffuse 30-50% disease in the mid and distal LAD. The largest previously stented diagonal reveals no evidence of in-stent restenosis.  Normal left ventricular size and function with mildly elevated LVEDP consistent with diastolic heart failure, asymptomatic. EF 60%.  Successful stenting of the distal RCA and third left ventricular branch reducing stenoses to 0% and 0% with TIMI grade 3 flow. Onyx 3.5 x 18 and 2.25 x 15 DES deployed at 15 atm in distal RCA and third LV branch respectively. TIMI grade 3 flow is noted beyond age treatment area.  RECOMMENDATIONS:   This gentleman needs aggressive risk factor modification he has moderate to moderately severe left main and LAD disease although I do not believe they are critical and surgical therapy at this time would risk graft occlusion because native flow is not  impaired.  Aspirin and Brilinta 12 months  Hopeful discharge in a.m.    _____________   History of Present Illness     Dwayne Garrett is a 61 y.o. male with a history of CAD with previous PCI of diagonal and DES to the RCA in 2011, OSA, and hypertension who was admitted with unstable angina. He complained of dull chest discomfort, radiation into his jaws and arms, moderate intensity, relieved with nitroglycerin, also diaphoretic with the spells and short of breath. Over the previous 24-48 hours he had been having symptoms at rest and in the early morning hours, was eventually encouraged by his wife to come to the hospital. He was transferred to [facility] for cardiac catheterization. He had no chest pain at the time of transfer.   Hospital Course     Consultants: None  Troponins became positive and peaked at 1.5 and subsequently trended downward. He was taken for cardiac catheterization on 03/11/17 and found to have moderate to moderately severe left main and LAD disease although it was not thought that they are critical and surgical therapy at the time would risk graft occlusion because native flow is not impaired. He had successful stenting of the distal RCA and third left ventricular branch. EF was estimated to be 60%. This gentleman needs aggressive risk factor modification and will be on aspirin and Brilinta for 12 months. He has been stable through the night, although he did have bradycardia and some pauses with the longest being approximately 4 seconds. This occurred in the setting of his sleep apnea and he was not wearing a CPAP. He is encouraged to be  compliant with CPAP at home. We will discontinue his Toprol advising the patient to first cut his dose in half for several days and then stop. His blood pressure was elevated and we will increase his amlodipine to 5 mg twice a day.  He has been up walking in the hall this morning without any difficulties  Serum creatinine remained  stable and is 0.70 on discharge.  Patient has been seen by Dr. Harrington Challenger today and deemed ready for discharge home. All follow up appointments have been scheduled. Discharge medications are listed below. _____________  Discharge Vitals Blood pressure (!) 153/88, pulse (!) 59, temperature 98.2 F (36.8 C), temperature source Oral, resp. rate 15, height _0  (1.753 m), weight 234 lb 11.2 oz (106.5 kg), SpO2 98 %.  Filed Weights   03/10/17 1653 03/11/17 0500 03/12/17 0333  Weight: 227 lb 1.2 oz (103 kg) 227 lb 8.2 oz (103.2 kg) 234 lb 11.2 oz (106.5 kg)   Physical Exam  Constitutional: He is oriented to person, place, and time. He appears well-developed and well-nourished.  HENT:  Head: Normocephalic and atraumatic.  Neck: Normal range of motion. No JVD present.  Cardiovascular: Normal rate and regular rhythm.  Exam reveals no gallop and no friction rub.   No murmur heard. Pulmonary/Chest: Effort normal and breath sounds normal.  Abdominal: Soft. Bowel sounds are normal.  Musculoskeletal: Normal range of motion. He exhibits no edema.  Neurological: He is alert and oriented to person, place, and time.  Skin: Skin is warm and dry.  Psychiatric: He has a normal mood and affect. His behavior is normal.    Labs & Radiologic Studies    CBC  Recent Labs  03/11/17 1517 03/12/17 0217  WBC 4.6 5.5  HGB 13.4 13.0  HCT 40.7 39.8  MCV 92.7 92.3  PLT 227 008   Basic Metabolic Panel  Recent Labs  03/10/17 0400 03/11/17 1517 03/12/17 0217  NA 138  --  138  K 3.7  --  4.0  CL 105  --  108  CO2 25  --  25  GLUCOSE 108*  --  108*  BUN 20  --  11  CREATININE 0.75 0.79 0.70  CALCIUM 9.2  --  8.8*   Liver Function Tests No results for input(s): AST, ALT, ALKPHOS, BILITOT, PROT, ALBUMIN in the last 72 hours. No results for input(s): LIPASE, AMYLASE in the last 72 hours. Cardiac Enzymes  Recent Labs  03/10/17 1848 03/11/17 0029 03/11/17 0659  TROPONINI 1.07* 0.96* 0.77*    BNP Invalid input(s): POCBNP D-Dimer No results for input(s): DDIMER in the last 72 hours. Hemoglobin A1C No results for input(s): HGBA1C in the last 72 hours. Fasting Lipid Panel No results for input(s): CHOL, HDL, LDLCALC, TRIG, CHOLHDL, LDLDIRECT in the last 72 hours. Thyroid Function Tests No results for input(s): TSH, T4TOTAL, T3FREE, THYROIDAB in the last 72 hours.  Invalid input(s): FREET3 _____________  Dg Chest 2 View  Result Date: 03/10/2017 CLINICAL DATA:  Chest pain off and on for the last week. Diaphoresis today. Pain radiating to the jaw. EXAM: CHEST  2 VIEW COMPARISON:  12/09/2014 FINDINGS: Normal heart size and pulmonary vascularity. No focal airspace disease or consolidation in the lungs. No blunting of costophrenic angles. No pneumothorax. Mediastinal contours appear intact. Calcification of the aorta. Degenerative changes in the spine. IMPRESSION: No active cardiopulmonary disease. Electronically Signed   By: Lucienne Capers M.D.   On: 03/10/2017 04:35   Disposition   Pt is being discharged  home today in good condition.  Follow-up Plans & Appointments    Follow-up Information    Satira Sark, MD Follow up.   Specialty:  Cardiology Why:  Keep your appointment on 9/26 at 3:40.  Contact information: [place] [address] [phone]          Discharge Instructions    Diet - low sodium heart healthy    Complete by:  As directed    Discharge instructions    Complete by:  As directed    Cut metoprolol in half for several days and then stop taking all together.  May resume metformin on Sunday.   PLEASE REMEMBER TO BRING ALL OF YOUR MEDICATIONS TO EACH OF YOUR FOLLOW-UP OFFICE VISITS.  PLEASE ATTEND ALL SCHEDULED FOLLOW-UP APPOINTMENTS.   Activity: Increase activity slowly as tolerated. You may shower, but no soaking baths (or swimming) for 1 week. No driving for 1 week. No lifting over 10 lbs for 2 weeks. No sexual activity  for 2 weeks.   You May Return to Work: in 1 week (if applicable)  Wound Care: You may wash cath site gently with soap and water. Keep cath site clean and dry. If you notice pain, swelling, bleeding or pus at your cath site, please call [phone].   Increase activity slowly    Complete by:  As directed       Discharge Medications   Current Discharge Medication List    START taking these medications   Details  aspirin 81 MG chewable tablet Chew 1 tablet (81 mg total) by mouth daily. Qty: 30 tablet, Refills: 11    atorvastatin (LIPITOR) 80 MG tablet Take 1 tablet (80 mg total) by mouth daily at 6 PM. Qty: 30 tablet, Refills: 11    ticagrelor (BRILINTA) 90 MG TABS tablet Take 1 tablet (90 mg total) by mouth 2 (two) times daily. Qty: 60 tablet, Refills: 0      CONTINUE these medications which have CHANGED   Details  amLODipine (NORVASC) 5 MG tablet Take 1 tablet (5 mg total) by mouth 2 (two) times daily. Qty: 60 tablet, Refills: 5      CONTINUE these medications which have NOT CHANGED   Details  diclofenac (VOLTAREN) 75 MG EC tablet Take 75 mg by mouth 2 (two) times daily.    Insulin Degludec (TRESIBA FLEXTOUCH [place]) Inject 5 Units into the skin daily.    metFORMIN (GLUCOPHAGE) 500 MG tablet Take 1,000 mg by mouth 2 (two) times daily.  Refills: 3    Multiple Vitamin (MULTIVITAMIN WITH MINERALS) TABS tablet Take 1 tablet by mouth daily.    NITROSTAT 0.4 MG SL tablet PLACE 1 TABLET UNDER TONGUE EVERY FIVE MINUTES AS DIRECTED Qty: 25 tablet, Refills: 3    Omega-3 Fatty Acids (FISH OIL) 1000 MG CAPS Take 3,000 mg by mouth daily.    omeprazole (PRILOSEC) 40 MG capsule TAKE 1 CAPSULE (40 MG TOTAL) BY MOUTH DAILY. Qty: 90 capsule, Refills: 3   Associated Diagnoses: Gastroesophageal reflux disease without esophagitis    ONETOUCH VERIO test strip TEST DAILY E11.9 Refills: 3    sildenafil (REVATIO) 20 MG tablet Take 100 mg by mouth daily as needed (erectile dysfunction).   Refills: 0    valsartan-hydrochlorothiazide (DIOVAN-HCT) 320-12.5 MG per tablet Take 1 tablet by mouth daily.        STOP taking these medications     aspirin 325 MG EC tablet      metoprolol succinate (TOPROL-XL) 25  MG 24 hr tablet          Aspirin prescribed at discharge?  Yes High Intensity Statin Prescribed? (Lipitor 40-9m or Crestor 20-460m: Yes Beta Blocker Prescribed? No: bradycardia For EF <40%, was ACEI/ARB Prescribed? Yes ADP Receptor Inhibitor Prescribed? (i.e. Plavix etc.-Includes Medically Managed Patients): Yes For EF <40%, Aldosterone Inhibitor Prescribed? No: EF 60% Was EF assessed during THIS hospitalization? Yes Was Cardiac Rehab II ordered? (Included Medically managed Patients): Yes   Outstanding Labs/Studies   None  Duration of Discharge Encounter   Greater than 30 minutes including physician time.  Signed, JaDaune PerchP 03/12/2017, 8:32 AM  Patient seen and examined  My PE is noted above  I agree with plans as noted by J Maryla Morrow Pt is stable for discharge on medications noted.  PaDorris Carnes

## 2017-03-12 NOTE — Progress Notes (Signed)
Cardiac Rehab Phase I  Advised by primary RN that pt has discharge orders, dressed and his ride home is here.  Pt ambulated with nursing staff in the hallway with no complaints.  Education provided to pt and his wife on heart healthy nutrition with DM modifications, exercise guidelines and activity restrictions, NTG and alert 911 for any unrelieved or similar in nature chest discomfort.  Pt well aware from prior cardiac events what he should do and admits for the most part he does very well. Pt exercises on his own at home on the treadmill. Exercise guidelines given to pt on speed and duration until he can be seen in the office on 9/26.  Pt not interested in attending formal cardiac rehab program and did not participate in phase II with other cardiac events.  Pt given brochure on [place] phase II for consideration.  Handouts given, confirmed Brilinta booklet and stent card are in his possession. Questions answered, Pt oob in chair awaiting completion of  discharge instruction [place] RN, BSN Cardiac and Pulmonary Rehab Nurse Navigator

## 2017-03-16 ENCOUNTER — Encounter: Payer: Self-pay | Admitting: Cardiology

## 2017-03-16 ENCOUNTER — Ambulatory Visit (INDEPENDENT_AMBULATORY_CARE_PROVIDER_SITE_OTHER): Payer: PRIVATE HEALTH INSURANCE | Admitting: Cardiology

## 2017-03-16 VITALS — BP 146/78 | HR 82 | Ht 69.0 in | Wt 226.2 lb

## 2017-03-16 DIAGNOSIS — I10 Essential (primary) hypertension: Secondary | ICD-10-CM | POA: Diagnosis not present

## 2017-03-16 DIAGNOSIS — G4733 Obstructive sleep apnea (adult) (pediatric): Secondary | ICD-10-CM | POA: Diagnosis not present

## 2017-03-16 DIAGNOSIS — I25119 Atherosclerotic heart disease of native coronary artery with unspecified angina pectoris: Secondary | ICD-10-CM | POA: Diagnosis not present

## 2017-03-16 DIAGNOSIS — K7460 Unspecified cirrhosis of liver: Secondary | ICD-10-CM | POA: Diagnosis not present

## 2017-03-16 DIAGNOSIS — Z9989 Dependence on other enabling machines and devices: Secondary | ICD-10-CM | POA: Diagnosis not present

## 2017-03-16 NOTE — Progress Notes (Signed)
Cardiology Office Note  Date: 03/16/2017   ID: IDO WOLLMAN, DOB [birth year], MRN [id number]  PCP: Annetta Maw, MD  Primary Cardiologist: Rozann Lesches, MD   Chief Complaint  Patient presents with  . Hospitalization Follow-up    History of Present Illness: Dwayne Garrett is a 61 y.o. male last seen in the office back in February. I just recently saw him in consultation at [facility] in the setting of unstable angina and increased troponin I consistent with NSTEMI. He was transferred to [facility] and underwent cardiac catheterization with Dr. Tamala Julian. Procedure showed patent proximal and mid RCA stent sites with new severe stenosis in the distal RCA before the origin of the PDA and also new severe stenosis in the third left ventricular branch of the RCA. Otherwise there was moderate disease within the left main and LAD. He underwent successful DES intervention to the RCA and third left ventricular branch.  He presents today for follow-up posthospitalization. Reports feeling much better, no chest pain or unusual shortness of breath. He has already started walking on the treadmill, when for 20 minutes yesterday without problems. He would like to return to work. 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 He has been assured by his boss that he can return with restrictions which I would suggest include no lifting over 50 pounds and avoiding extremes of temperature.  We went over his medications. He reports compliance, no bleeding problems on dual antiplatelet therapy.  Past Medical History:  Diagnosis Date  . Abnormal LFTs    Cirrhosis per liver biopsy - follows with Dr. Laural Golden  . Bilateral leg edema    Chronic  . 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 Coronary atherosclerosis of native coronary artery    Multivessel (moderate), DES RCA 4/11, LVEF 60-65%. LHC 03/11/17: DES to dist RCA & 3rd left ventricular branch  . Essential hypertension, benign   . Inferior  myocardial infarction ([place])    09/2009  . OSA (obstructive sleep apnea)    on CPAP    Past Surgical History:  Procedure Laterality Date  . BONE CYST EXCISION     Cyst removal from neck  . CARDIAC CATHETERIZATION N/A 12/11/2014   Procedure: Left Heart Cath and Coronary Angiography;  Surgeon: Burnell Blanks, MD;  Location: [facility];  Service: Cardiovascular;  Laterality: N/A;  . CORONARY STENT INTERVENTION N/A 03/11/2017   Procedure: CORONARY STENT INTERVENTION;  Surgeon: Belva Crome, MD;  Location: [facility];  Service: Cardiovascular;  Laterality: N/A;  . LEFT HEART CATH AND CORONARY ANGIOGRAPHY N/A 03/11/2017   Procedure: LEFT HEART CATH AND CORONARY ANGIOGRAPHY;  Surgeon: Belva Crome, MD;  Location: [facility];  Service: Cardiovascular;  Laterality: N/A;  . SKIN GRAFT     Finger  . TUMOR REMOVAL     Abdominal  . ULTRASOUND GUIDANCE FOR VASCULAR ACCESS  03/11/2017   Procedure: Ultrasound Guidance For Vascular Access;  Surgeon: Belva Crome, MD;  Location: [facility];  Service: Cardiovascular;;    Current Outpatient Prescriptions  Medication Sig Dispense Refill  . amLODipine (NORVASC) 5 MG tablet Take 1 tablet (5 mg total) by mouth 2 (two) times daily. 60 tablet 5  . aspirin 81 MG chewable tablet Chew 1 tablet (81 mg total) by mouth daily. 30 tablet 11  . atorvastatin (LIPITOR) 80 MG tablet Take 1 tablet (80 mg total) by mouth daily at 6 PM. 30 tablet 11  .  diclofenac (VOLTAREN) 75 MG EC tablet Take 75 mg by mouth 2 (two) times daily.    . Insulin Degludec (TRESIBA FLEXTOUCH ) Inject 5 Units into the skin daily.    . metFORMIN (GLUCOPHAGE) 500 MG tablet Take 1,000 mg by mouth 2 (two) times daily.   3  . Multiple Vitamin (MULTIVITAMIN WITH MINERALS) TABS tablet Take 1 tablet by mouth daily.    Marland Kitchen NITROSTAT 0.4 MG SL tablet PLACE 1 TABLET UNDER TONGUE EVERY FIVE MINUTES AS DIRECTED 25 tablet 3  . Omega-3 Fatty Acids (FISH OIL) 1000 MG CAPS  Take 3,000 mg by mouth daily.    Marland Kitchen omeprazole (PRILOSEC) 40 MG capsule TAKE 1 CAPSULE (40 MG TOTAL) BY MOUTH DAILY. 90 capsule 3  . ONETOUCH VERIO test strip TEST DAILY E11.9  3  . sildenafil (REVATIO) 20 MG tablet Take 100 mg by mouth daily as needed (erectile dysfunction).   0  . Sildenafil Citrate (REVATIO PO) Take 100 mg by mouth as needed. Take 0.5 tablet by mouth as needed 30 minutes prior.    . ticagrelor (BRILINTA) 90 MG TABS tablet Take 1 tablet (90 mg total) by mouth 2 (two) times daily. 60 tablet 11  . valsartan-hydrochlorothiazide (DIOVAN-HCT) 320-12.5 MG per tablet Take 1 tablet by mouth daily.       No current facility-administered medications for this visit.    Allergies:  Patient has no known allergies.   Social History: The patient  reports that he quit smoking about 12 years ago. His smoking use included Cigarettes. His smokeless tobacco use includes Chew. He reports that he does not drink alcohol or use drugs.   ROS:  Please see the history of present illness. Otherwise, complete review of systems is positive for chronic arthritic knee pain.  All other systems are reviewed and negative.   Physical Exam: VS:  BP (!) 146/78   Pulse 82   Ht _0  (1.753 m)   Wt 226 lb 3.2 oz (102.6 kg)   SpO2 97%   BMI 33.40 kg/m , BMI Body mass index is 33.4 kg/m.  Wt Readings from Last 3 Encounters:  03/16/17 226 lb 3.2 oz (102.6 kg)  03/12/17 234 lb 11.2 oz (106.5 kg)  01/25/17 236 lb 0.5 oz (107.1 kg)    General: Obese male, appears comfortable at rest. HEENT: Conjunctiva and lids normal, oropharynx clear. Neck: Supple, no elevated JVP or carotid bruits, no thyromegaly. Lungs: Clear to auscultation, nonlabored breathing at rest. Cardiac: Regular rate and rhythm, no S3 or significant systolic murmur, no pericardial rub. Abdomen: Soft, nontender, bowel sounds present, no guarding or rebound. Extremities: No pitting edema, distal pulses 2+. Skin: Warm and dry. Musculoskeletal:  No kyphosis. Neuropsychiatric: Alert and oriented x3, affect grossly appropriate.  ECG: I personally reviewed the tracing from 03/12/2017 which showed sinus bradycardia with diffuse ST-T wave abnormalities.  Recent Labwork: 01/25/2017: ALT 214; AST 105 03/12/2017: BUN 11; Creatinine, Ser 0.70; Hemoglobin 13.0; Platelets 227; Potassium 4.0; Sodium 138   Other Studies Reviewed Today:  Cardiac catheterization and PCI 03/11/2017:  Unstable angina/non-ST elevation myocardial infarction presentation.  The previously stented proximal to mid RCA is widely patent  De novo 95-99% distal RCA before the origin of the PDA.  De novo 95% stenosis in the third left ventricular branch of the right coronary artery.  Moderate obstruction in the trunk of the left main, 40%. This is unchanged from prior angiogram.  Moderate eccentric 40-50% proximal to mid LAD. Diffuse 30-50% disease in the mid  and distal LAD. The largest previously stented diagonal reveals no evidence of in-stent restenosis.  Normal left ventricular size and function with mildly elevated LVEDP consistent with diastolic heart failure, asymptomatic. EF 60%.  Successful stenting of the distal RCA and third left ventricular branch reducing stenoses to 0% and 0% with TIMI grade 3 flow. Onyx 3.5 x 18 and 2.25 x 15 DES deployed at 15 atm in distal RCA and third LV branch respectively. TIMI grade 3 flow is noted beyond age treatment area.  RECOMMENDATIONS:   This gentleman needs aggressive risk factor modification he has moderate to moderately severe left main and LAD disease although I do not believe they are critical and surgical therapy at this time would risk graft occlusion because native flow is not impaired.  Aspirin and Brilinta 12 months  Hopeful discharge in a.m.  Assessment and Plan:  1. CAD status post recent presentation with unstable angina and mild troponin I elevation. Previously placed RCA stents were patent and there were  no stenoses within the distal RCA as well as third left ventricular branch. He is now status post placement of 2 additional DES and is doing well on dual antiplatelet therapy. He plans to return to work with restrictions as discussed above.  2. Essential hypertension, no changes made to current regimen.  3. OSA on CPAP.  4. History of chronic LFT abnormalities and diagnosis of cirrhosis by liver biopsy. He follows with Dr. Laural Golden.  Current medicines were reviewed with the patient today.  Disposition: Follow-up in 3 months.  Signed, Satira Sark, MD, [facility] 03/16/2017 4:02 PM    [facility] [place], [place], [place] [address] Phone: [phone]; Fax: [phone]

## 2017-03-16 NOTE — Patient Instructions (Signed)
Medication Instructions:  Your physician recommends that you continue on your current medications as directed. Please refer to the Current Medication list given to you today.  Labwork: NONE  Testing/Procedures: NONE  Follow-Up: Your physician recommends that you schedule a follow-up appointment in: 3 MONTHS WITH DR. MCDOWELL.  Any Other Special Instructions Will Be Listed Below (If Applicable).  If you need a refill on your cardiac medications before your next appointment, please call your pharmacy. 

## 2017-03-31 ENCOUNTER — Other Ambulatory Visit: Payer: Self-pay | Admitting: Cardiology

## 2017-04-07 ENCOUNTER — Other Ambulatory Visit: Payer: Self-pay

## 2017-04-07 MED ORDER — AMLODIPINE BESYLATE 5 MG PO TABS: 5.0000 mg | ORAL_TABLET | Freq: Two times a day (BID) | ORAL | 2 refills | Status: DC

## 2017-04-14 ENCOUNTER — Encounter (INDEPENDENT_AMBULATORY_CARE_PROVIDER_SITE_OTHER): Payer: Self-pay | Admitting: *Deleted

## 2017-04-14 ENCOUNTER — Other Ambulatory Visit (INDEPENDENT_AMBULATORY_CARE_PROVIDER_SITE_OTHER): Payer: Self-pay | Admitting: *Deleted

## 2017-04-14 DIAGNOSIS — K7581 Nonalcoholic steatohepatitis (NASH): Principal | ICD-10-CM

## 2017-04-14 DIAGNOSIS — K7460 Unspecified cirrhosis of liver: Secondary | ICD-10-CM

## 2017-04-21 ENCOUNTER — Ambulatory Visit (HOSPITAL_COMMUNITY)
Admission: RE | Admit: 2017-04-21 | Discharge: 2017-04-21 | Disposition: A | Discharge status: Home / Self Care | Payer: PRIVATE HEALTH INSURANCE | Source: Ambulatory Visit | Attending: Internal Medicine | Admitting: Internal Medicine

## 2017-04-21 DIAGNOSIS — K7460 Unspecified cirrhosis of liver: Secondary | ICD-10-CM | POA: Insufficient documentation

## 2017-04-21 DIAGNOSIS — K7581 Nonalcoholic steatohepatitis (NASH): Secondary | ICD-10-CM | POA: Insufficient documentation

## 2017-04-21 DIAGNOSIS — K7689 Other specified diseases of liver: Secondary | ICD-10-CM | POA: Insufficient documentation

## 2017-04-27 LAB — HEPATIC FUNCTION PANEL
AG Ratio: 1.4 (calc) (ref 1.0–2.5)
ALT: 83 U/L — ABNORMAL HIGH (ref 9–46)
AST: 65 U/L — ABNORMAL HIGH (ref 10–35)
Albumin: 4.2 g/dL (ref 3.6–5.1)
Alkaline phosphatase (APISO): 53 U/L (ref 40–115)
BILIRUBIN DIRECT: 0.2 mg/dL (ref 0.0–0.2)
BILIRUBIN INDIRECT: 0.6 mg/dL (ref 0.2–1.2)
GLOBULIN: 3.1 g/dL (ref 1.9–3.7)
Total Bilirubin: 0.8 mg/dL (ref 0.2–1.2)
Total Protein: 7.3 g/dL (ref 6.1–8.1)

## 2017-04-28 ENCOUNTER — Other Ambulatory Visit (INDEPENDENT_AMBULATORY_CARE_PROVIDER_SITE_OTHER): Payer: Self-pay | Admitting: *Deleted

## 2017-04-28 DIAGNOSIS — R740 Nonspecific elevation of levels of transaminase and lactic acid dehydrogenase [LDH]: Principal | ICD-10-CM

## 2017-04-28 DIAGNOSIS — R7401 Elevation of levels of liver transaminase levels: Secondary | ICD-10-CM

## 2017-06-20 ENCOUNTER — Ambulatory Visit: Payer: PRIVATE HEALTH INSURANCE | Admitting: Cardiology

## 2017-06-20 ENCOUNTER — Encounter: Payer: Self-pay | Admitting: Cardiology

## 2017-06-20 ENCOUNTER — Other Ambulatory Visit: Payer: Self-pay

## 2017-06-20 VITALS — BP 127/79 | HR 78 | Ht 69.0 in | Wt 219.2 lb

## 2017-06-20 DIAGNOSIS — I25119 Atherosclerotic heart disease of native coronary artery with unspecified angina pectoris: Secondary | ICD-10-CM

## 2017-06-20 DIAGNOSIS — Z9989 Dependence on other enabling machines and devices: Secondary | ICD-10-CM | POA: Diagnosis not present

## 2017-06-20 DIAGNOSIS — G4733 Obstructive sleep apnea (adult) (pediatric): Secondary | ICD-10-CM | POA: Diagnosis not present

## 2017-06-20 DIAGNOSIS — I10 Essential (primary) hypertension: Secondary | ICD-10-CM | POA: Diagnosis not present

## 2017-06-20 NOTE — Progress Notes (Signed)
Cardiology Office Note  Date: 06/20/2017   ID: Dwayne Garrett, DOB 03-Jul-1955, MRN [id number]  PCP: Annetta Maw, MD  Primary Cardiologist: Rozann Lesches, MD   Chief Complaint  Patient presents with  . Coronary Artery Disease    History of Present Illness: Dwayne Garrett is a 61 y.o. male last seen in September. He presents for a routine follow-up visit. He has done well since DES intervention in September as reviewed below. He is back to work with prior responsibilities, reports no angina on medical therapy. He reports compliance with his medications including dual antiplatelet regimen, no significant bleeding problems.  Current cardiac regimen includes aspirin, Brilinta, Norvasc, Lipitor, Diovan HCTZ, and as needed nitroglycerin.  Past Medical History:  Diagnosis Date  . Abnormal LFTs    Cirrhosis per liver biopsy - follows with Dr. Laural Golden  . Bilateral leg edema    Chronic  . 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 Coronary atherosclerosis of native coronary artery    Multivessel (moderate), DES RCA 4/11, LVEF 60-65%. LHC 03/11/17: DES to dist RCA & 3rd left ventricular branch  . Essential hypertension, benign   . 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 OSA (obstructive sleep apnea)    on CPAP    Past Surgical History:  Procedure Laterality Date  . BONE CYST EXCISION     Cyst removal from neck  . CARDIAC CATHETERIZATION N/A 12/11/2014   Procedure: Left Heart Cath and Coronary Angiography;  Surgeon: Burnell Blanks, MD;  Location: [facility];  Service: Cardiovascular;  Laterality: N/A;  . CORONARY STENT INTERVENTION N/A 03/11/2017   Procedure: CORONARY STENT INTERVENTION;  Surgeon: Belva Crome, MD;  Location: [facility];  Service: Cardiovascular;  Laterality: N/A;  . LEFT HEART CATH AND CORONARY ANGIOGRAPHY N/A 03/11/2017   Procedure: LEFT HEART CATH AND CORONARY ANGIOGRAPHY;  Surgeon: Belva Crome, MD;  Location: [facility];   Service: Cardiovascular;  Laterality: N/A;  . SKIN GRAFT     Finger  . TUMOR REMOVAL     Abdominal  . ULTRASOUND GUIDANCE FOR VASCULAR ACCESS  03/11/2017   Procedure: Ultrasound Guidance For Vascular Access;  Surgeon: Belva Crome, MD;  Location: [facility];  Service: Cardiovascular;;    Current Outpatient Medications  Medication Sig Dispense Refill  . amLODipine (NORVASC) 5 MG tablet Take 1 tablet (5 mg total) by mouth 2 (two) times daily. 60 tablet 2  . aspirin 81 MG chewable tablet Chew 1 tablet (81 mg total) by mouth daily. 30 tablet 11  . atorvastatin (LIPITOR) 80 MG tablet Take 1 tablet (80 mg total) by mouth daily at 6 PM. 30 tablet 11  . Insulin Degludec (TRESIBA FLEXTOUCH [place]) Inject 5 Units into the skin daily.    . metFORMIN (GLUCOPHAGE) 500 MG tablet Take 1,000 mg by mouth 2 (two) times daily.   3  . Multiple Vitamin (MULTIVITAMIN WITH MINERALS) TABS tablet Take 1 tablet by mouth daily.    Marland Kitchen NITROSTAT 0.4 MG SL tablet PLACE 1 TABLET UNDER TONGUE EVERY FIVE MINUTES AS DIRECTED 25 tablet 3  . Omega-3 Fatty Acids (FISH OIL) 1000 MG CAPS Take 3,000 mg by mouth daily.    Marland Kitchen omeprazole (PRILOSEC) 40 MG capsule TAKE 1 CAPSULE (40 MG TOTAL) BY MOUTH DAILY. 90 capsule 3  . ONETOUCH VERIO test strip TEST DAILY E11.9  3  . sildenafil (REVATIO) 20 MG tablet Take 100 mg by mouth daily as  needed (erectile dysfunction).   0  . ticagrelor (BRILINTA) 90 MG TABS tablet Take 1 tablet (90 mg total) by mouth 2 (two) times daily. 60 tablet 11  . valsartan-hydrochlorothiazide (DIOVAN-HCT) 320-12.5 MG per tablet Take 1 tablet by mouth daily.       No current facility-administered medications for this visit.    Allergies:  Patient has no known allergies.   Social History: The patient  reports that he quit smoking about 12 years ago. His smoking use included cigarettes. His smokeless tobacco use includes chew. He reports that he does not drink alcohol or use drugs.   ROS:  Please see the  history of present illness. Otherwise, complete review of systems is positive for arthritic pains.  All other systems are reviewed and negative.   Physical Exam: VS:  BP 127/79   Pulse 78   Ht 5' 9"  (1.753 m)   Wt 219 lb 3.2 oz (99.4 kg)   BMI 32.37 kg/m , BMI Body mass index is 32.37 kg/m.  Wt Readings from Last 3 Encounters:  06/20/17 219 lb 3.2 oz (99.4 kg)  03/16/17 226 lb 3.2 oz (102.6 kg)  03/12/17 234 lb 11.2 oz (106.5 kg)    General: Patient appears comfortable at rest. HEENT: Conjunctiva and lids normal, oropharynx clear. Neck: Supple, no elevated JVP or carotid bruits, no thyromegaly. Lungs: Clear to auscultation, nonlabored breathing at rest. Cardiac: Regular rate and rhythm, no S3 or significant systolic murmur, no pericardial rub. Abdomen: Soft, nontender, bowel sounds present, no guarding or rebound. Extremities: No pitting edema, distal pulses 2+. Skin: Warm and dry. Musculoskeletal: No kyphosis. Neuropsychiatric: Alert and oriented x3, affect grossly appropriate.  ECG: I personally reviewed the tracing from 03/12/2017 which showed sinus bradycardia with nonspecific ST-T changes.  Recent Labwork: 03/12/2017: BUN 11; Creatinine, Ser 0.70; Hemoglobin 13.0; Platelets 227; Potassium 4.0; Sodium 138 04/26/2017: ALT 83; AST 65     Component Value Date/Time   CHOL 187 12/21/2010 0809   TRIG 73 12/21/2010 0809   HDL 54 12/21/2010 0809   CHOLHDL 3.5 12/21/2010 0809   VLDL 15 12/21/2010 0809   LDLCALC 118 (H) 12/21/2010 0809    Other Studies Reviewed Today:  Cardiac catheterization and PCI 03/11/2017:  Unstable angina/non-ST elevation myocardial infarction presentation.  The previously stented proximal to mid RCA is widely patent  De novo 95-99% distal RCA before the origin of the PDA.  De novo 95% stenosis in the third left ventricular branch of the right coronary artery.  Moderate obstruction in the trunk of the left main, 40%. This is unchanged from prior  angiogram.  Moderate eccentric 40-50% proximal to mid LAD. Diffuse 30-50% disease in the mid and distal LAD. The largest previously stented diagonal reveals no evidence of in-stent restenosis.  Normal left ventricular size and function with mildly elevated LVEDP consistent with diastolic heart failure, asymptomatic. EF 60%.  Successful stenting of the distal RCA and third left ventricular branch reducing stenoses to 0% and 0% with TIMI grade 3 flow. Onyx 3.5 x 18 and 2.25 x 15 DES deployed at 15 atm in distal RCA and third LV branch respectively. TIMI grade 3 flow is noted beyond age treatment area.  RECOMMENDATIONS:   This gentleman needs aggressive risk factor modification he has moderate to moderately severe left main and LAD disease although I do not believe they are critical and surgical therapy at this time would risk graft occlusion because native flow is not impaired.  Aspirin and Brilinta 12 months  Hopeful discharge in a.m.  Assessment and Plan:  1. CAD status post recurrent DES intervention in September of this year is detailed above. He is clinically stable on medical therapy which includes dual antiplatelet regimen. Continue observation.  2. Essential hypertension, blood pressure is well controlled today.  3. OSA on CPAP.  Current medicines were reviewed with the patient today.  Disposition: Follow-up in 6 months.  Signed, Satira Sark, MD, [facility] 06/20/2017 4:19 PM    [facility] [place], [place], [place] [address] Phone: [phone]; Fax: [phone]

## 2017-06-20 NOTE — Patient Instructions (Signed)

## 2017-07-15 ENCOUNTER — Other Ambulatory Visit (INDEPENDENT_AMBULATORY_CARE_PROVIDER_SITE_OTHER): Payer: Self-pay | Admitting: *Deleted

## 2017-07-15 ENCOUNTER — Encounter (INDEPENDENT_AMBULATORY_CARE_PROVIDER_SITE_OTHER): Payer: Self-pay | Admitting: *Deleted

## 2017-07-15 DIAGNOSIS — R7401 Elevation of levels of liver transaminase levels: Secondary | ICD-10-CM

## 2017-07-15 DIAGNOSIS — R740 Nonspecific elevation of levels of transaminase and lactic acid dehydrogenase [LDH]: Principal | ICD-10-CM

## 2017-07-26 ENCOUNTER — Telehealth (INDEPENDENT_AMBULATORY_CARE_PROVIDER_SITE_OTHER): Payer: Self-pay | Admitting: *Deleted

## 2017-07-26 NOTE — Telephone Encounter (Signed)
Patient came in requesting for his lab results to be sent to [facility] at [facility] in [place].

## 2017-08-01 ENCOUNTER — Other Ambulatory Visit (INDEPENDENT_AMBULATORY_CARE_PROVIDER_SITE_OTHER): Payer: Self-pay | Admitting: *Deleted

## 2017-08-01 DIAGNOSIS — K7581 Nonalcoholic steatohepatitis (NASH): Principal | ICD-10-CM

## 2017-08-01 DIAGNOSIS — K7460 Unspecified cirrhosis of liver: Secondary | ICD-10-CM

## 2017-08-01 LAB — HEPATIC FUNCTION PANEL
AG RATIO: 1.3 (calc) (ref 1.0–2.5)
ALT: 29 U/L (ref 9–46)
AST: 23 U/L (ref 10–35)
Albumin: 4 g/dL (ref 3.6–5.1)
Alkaline phosphatase (APISO): 60 U/L (ref 40–115)
BILIRUBIN DIRECT: 0.2 mg/dL (ref 0.0–0.2)
BILIRUBIN TOTAL: 0.8 mg/dL (ref 0.2–1.2)
Globulin: 3.1 g/dL (calc) (ref 1.9–3.7)
Indirect Bilirubin: 0.6 mg/dL (calc) (ref 0.2–1.2)
Total Protein: 7.1 g/dL (ref 6.1–8.1)

## 2017-08-01 LAB — TEST AUTHORIZATION

## 2017-08-01 LAB — IGG: IGG (IMMUNOGLOBIN G), SERUM: 1235 mg/dL (ref 694–1618)

## 2017-08-01 LAB — AFP TUMOR MARKER: AFP-Tumor Marker: 1.2 ng/mL (ref <6.1)

## 2017-08-01 NOTE — Telephone Encounter (Signed)
Labs have been faxed.

## 2017-08-16 ENCOUNTER — Telehealth (INDEPENDENT_AMBULATORY_CARE_PROVIDER_SITE_OTHER): Payer: Self-pay | Admitting: *Deleted

## 2017-08-16 NOTE — Telephone Encounter (Signed)
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 Patient states he is unsure if the chorisis of the liver is causing this. Please advise [phone]

## 2017-08-16 NOTE — Telephone Encounter (Signed)
I advised patient that it is doubtful his diarrhea was caused by his cirrhosis. Can try Imodium BID

## 2017-12-26 NOTE — Progress Notes (Signed)
Cardiology Office Note  Date: 12/27/2017   ID: Dwayne Garrett, DOB 04/17/1956, MRN [id number]  PCP: Annetta Maw, MD  Primary Cardiologist: Rozann Lesches, MD   Chief Complaint  Patient presents with  . Coronary Artery Disease    History of Present Illness: Dwayne Garrett is a 62 y.o. male last seen in December 2018.  He is here for a routine visit. Reports no angina symptoms, feeling well with usual activities.  He has been working outside, has cut up some trees that have fallen in his yard.  He reports NYHA class II dyspnea, no palpitations or syncope.  I reviewed his medications.  He ran out of Brilinta which we will refill.  Otherwise continues on aspirin, Norvasc, Lipitor, and Diovan HCT.  He has nitroglycerin available but has not used any.  He has also been on sildenafil as needed per PCP, we did discuss not using this within 24 hours of nitrates.  I personally reviewed his ECG today which shows normal sinus rhythm.  Past Medical History:  Diagnosis Date  . Abnormal LFTs    Cirrhosis per liver biopsy - follows with Dr. Laural Golden  . Bilateral leg edema    Chronic  . 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 Coronary atherosclerosis of native coronary artery    Multivessel (moderate), DES RCA 4/11, LVEF 60-65%. LHC 03/11/17: DES to dist RCA & 3rd left ventricular branch  . Essential hypertension, benign   . 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 OSA (obstructive sleep apnea)    on CPAP    Past Surgical History:  Procedure Laterality Date  . BONE CYST EXCISION     Cyst removal from neck  . CARDIAC CATHETERIZATION N/A 12/11/2014   Procedure: Left Heart Cath and Coronary Angiography;  Surgeon: Burnell Blanks, MD;  Location: [facility];  Service: Cardiovascular;  Laterality: N/A;  . CORONARY STENT INTERVENTION N/A 03/11/2017   Procedure: CORONARY STENT INTERVENTION;  Surgeon: Belva Crome, MD;  Location: [facility];  Service: Cardiovascular;   Laterality: N/A;  . LEFT HEART CATH AND CORONARY ANGIOGRAPHY N/A 03/11/2017   Procedure: LEFT HEART CATH AND CORONARY ANGIOGRAPHY;  Surgeon: Belva Crome, MD;  Location: [facility];  Service: Cardiovascular;  Laterality: N/A;  . SKIN GRAFT     Finger  . TUMOR REMOVAL     Abdominal  . ULTRASOUND GUIDANCE FOR VASCULAR ACCESS  03/11/2017   Procedure: Ultrasound Guidance For Vascular Access;  Surgeon: Belva Crome, MD;  Location: [facility];  Service: Cardiovascular;;    Current Outpatient Medications  Medication Sig Dispense Refill  . amLODipine (NORVASC) 5 MG tablet Take 1 tablet (5 mg total) by mouth 2 (two) times daily. 60 tablet 2  . aspirin 81 MG chewable tablet Chew 1 tablet (81 mg total) by mouth daily. 30 tablet 11  . atorvastatin (LIPITOR) 80 MG tablet Take 1 tablet (80 mg total) by mouth daily at 6 PM. 30 tablet 11  . metFORMIN (GLUCOPHAGE) 500 MG tablet Take 1,000 mg by mouth every morning.   3  . Multiple Vitamin (MULTIVITAMIN WITH MINERALS) TABS tablet Take 1 tablet by mouth daily.    Marland Kitchen NITROSTAT 0.4 MG SL tablet PLACE 1 TABLET UNDER TONGUE EVERY FIVE MINUTES AS DIRECTED 25 tablet 3  . Omega-3 Fatty Acids (FISH OIL) 1000 MG CAPS Take 3,000 mg by mouth daily.    Marland Kitchen omeprazole (PRILOSEC) 40 MG capsule TAKE 1  CAPSULE (40 MG TOTAL) BY MOUTH DAILY. 90 capsule 3  . ONETOUCH VERIO test strip TEST DAILY E11.9  3  . sildenafil (REVATIO) 20 MG tablet Take 100 mg by mouth daily as needed (erectile dysfunction).   0  . valsartan-hydrochlorothiazide (DIOVAN-HCT) 320-12.5 MG per tablet Take 1 tablet by mouth daily.      . ticagrelor (BRILINTA) 90 MG TABS tablet Take 1 tablet (90 mg total) by mouth 2 (two) times daily. 180 tablet 0   No current facility-administered medications for this visit.    Allergies:  Patient has no known allergies.   Social History: The patient  reports that he quit smoking about 13 years ago. His smoking use included cigarettes. His smokeless  tobacco use includes chew. He reports that he does not drink alcohol or use drugs.   ROS:  Please see the history of present illness. Otherwise, complete review of systems is positive for none.  All other systems are reviewed and negative.   Physical Exam: VS:  BP (!) 158/87   Pulse 69   Ht 5' 9"  (1.753 m)   Wt 223 lb 9.6 oz (101.4 kg)   SpO2 98%   BMI 33.02 kg/m , BMI Body mass index is 33.02 kg/m.  Wt Readings from Last 3 Encounters:  12/27/17 223 lb 9.6 oz (101.4 kg)  06/20/17 219 lb 3.2 oz (99.4 kg)  03/16/17 226 lb 3.2 oz (102.6 kg)    General: Patient appears comfortable at rest. HEENT: Conjunctiva and lids normal, oropharynx clear. Neck: Supple, no elevated JVP or carotid bruits, no thyromegaly. Lungs: Clear to auscultation, nonlabored breathing at rest. Cardiac: Regular rate and rhythm, no S3 or significant systolic murmur. Abdomen: Soft, nontender, bowel sounds present. Extremities: No pitting edema, distal pulses 2+. Skin: Warm and dry. Musculoskeletal: No kyphosis. Neuropsychiatric: Alert and oriented x3, affect grossly appropriate.  ECG: I personally reviewed the tracing from 03/12/2017 which showed sinus bradycardia with nonspecific ST-T changes.  Recent Labwork: 03/12/2017: BUN 11; Creatinine, Ser 0.70; Hemoglobin 13.0; Platelets 227; Potassium 4.0; Sodium 138 07/26/2017: ALT 29; AST 23   Other Studies Reviewed Today:  Cardiac catheterization and PCI 03/11/2017:  Unstable angina/non-ST elevation myocardial infarction presentation.  The previously stented proximal to mid RCA is widely patent  De novo 95-99% distal RCA before the origin of the PDA.  De novo 95% stenosis in the third left ventricular branch of the right coronary artery.  Moderate obstruction in the trunk of the left main, 40%. This is unchanged from prior angiogram.  Moderate eccentric 40-50% proximal to mid LAD. Diffuse 30-50% disease in the mid and distal LAD. The largest previously stented  diagonal reveals no evidence of in-stent restenosis.  Normal left ventricular size and function with mildly elevated LVEDP consistent with diastolic heart failure, asymptomatic. EF 60%.  Successful stenting of the distal RCA and third left ventricular branch reducing stenoses to 0% and 0% with TIMI grade 3 flow. Onyx 3.5 x 18 and 2.25 x 15 DES deployed at 15 atm in distal RCA and third LV branch respectively. TIMI grade 3 flow is noted beyond age treatment area.  RECOMMENDATIONS:   This gentleman needs aggressive risk factor modification he has moderate to moderately severe left main and LAD disease although I do not believe they are critical and surgical therapy at this time would risk graft occlusion because native flow is not impaired.  Aspirin and Brilinta 12 months  Hopeful discharge in a.m.  Assessment and Plan:  1.  CAD status  post DES intervention to the RCA and third LV branch in September 2018 with moderate residual left main and LAD disease that is being managed medically.  He will continue dual antiplatelet therapy through September.  Otherwise continue present medications.  2.  OSA on CPAP.  3.  Essential hypertension, no changes made to present regimen.  Keep follow-up with PCP.  Current medicines were reviewed with the patient today.   Orders Placed This Encounter  Procedures  . EKG 12-Lead    Disposition: Follow-up in 6 months.  Signed, Satira Sark, MD, [facility] 12/27/2017 3:52 PM    [facility] [place], [place], [place] [address] Phone: [phone]; Fax: [phone]

## 2017-12-27 ENCOUNTER — Ambulatory Visit: Payer: PRIVATE HEALTH INSURANCE | Admitting: Cardiology

## 2017-12-27 ENCOUNTER — Encounter: Payer: Self-pay | Admitting: Cardiology

## 2017-12-27 VITALS — BP 158/87 | HR 69 | Ht 69.0 in | Wt 223.6 lb

## 2017-12-27 DIAGNOSIS — G4733 Obstructive sleep apnea (adult) (pediatric): Secondary | ICD-10-CM | POA: Diagnosis not present

## 2017-12-27 DIAGNOSIS — I25119 Atherosclerotic heart disease of native coronary artery with unspecified angina pectoris: Secondary | ICD-10-CM | POA: Diagnosis not present

## 2017-12-27 DIAGNOSIS — Z9989 Dependence on other enabling machines and devices: Secondary | ICD-10-CM

## 2017-12-27 DIAGNOSIS — I10 Essential (primary) hypertension: Secondary | ICD-10-CM

## 2017-12-27 MED ORDER — TICAGRELOR 90 MG PO TABS: 90.0000 mg | ORAL_TABLET | Freq: Two times a day (BID) | ORAL | 0 refills | Status: DC

## 2017-12-27 NOTE — Patient Instructions (Addendum)
Medication Instructions:  Your physician has recommended you make the following change in your medication:    3 month supply of brilinta sent to pharmacy. Once finished with 3 month supply please STOP taking   Please continue all other medications as prescribed  Labwork: NONE  Testing/Procedures: NONE  Follow-Up: Your physician wants you to follow-up in: [place]. You will receive a reminder letter in the mail two months in advance. If you don't receive a letter, please call our office to schedule the follow-up appointment.  Any Other Special Instructions Will Be Listed Below (If Applicable).  If you need a refill on your cardiac medications before your next appointment, please call your pharmacy.

## 2018-01-16 ENCOUNTER — Other Ambulatory Visit (INDEPENDENT_AMBULATORY_CARE_PROVIDER_SITE_OTHER): Payer: Self-pay | Admitting: *Deleted

## 2018-01-16 ENCOUNTER — Encounter (INDEPENDENT_AMBULATORY_CARE_PROVIDER_SITE_OTHER): Payer: Self-pay | Admitting: *Deleted

## 2018-01-16 DIAGNOSIS — K7581 Nonalcoholic steatohepatitis (NASH): Principal | ICD-10-CM

## 2018-01-16 DIAGNOSIS — K7460 Unspecified cirrhosis of liver: Secondary | ICD-10-CM

## 2018-01-31 LAB — AFP TUMOR MARKER: AFP TUMOR MARKER: 1.3 ng/mL (ref <6.1)

## 2018-02-08 ENCOUNTER — Other Ambulatory Visit (INDEPENDENT_AMBULATORY_CARE_PROVIDER_SITE_OTHER): Payer: Self-pay | Admitting: *Deleted

## 2018-02-08 DIAGNOSIS — K7581 Nonalcoholic steatohepatitis (NASH): Principal | ICD-10-CM

## 2018-02-08 DIAGNOSIS — K7460 Unspecified cirrhosis of liver: Secondary | ICD-10-CM

## 2018-06-26 ENCOUNTER — Encounter: Payer: Self-pay | Admitting: Cardiology

## 2018-06-26 ENCOUNTER — Ambulatory Visit: Payer: PRIVATE HEALTH INSURANCE | Admitting: Cardiology

## 2018-06-26 VITALS — BP 162/87 | HR 70 | Ht 69.0 in | Wt 218.2 lb

## 2018-06-26 DIAGNOSIS — Z9989 Dependence on other enabling machines and devices: Secondary | ICD-10-CM

## 2018-06-26 DIAGNOSIS — G4733 Obstructive sleep apnea (adult) (pediatric): Secondary | ICD-10-CM

## 2018-06-26 DIAGNOSIS — I10 Essential (primary) hypertension: Secondary | ICD-10-CM | POA: Diagnosis not present

## 2018-06-26 DIAGNOSIS — E782 Mixed hyperlipidemia: Secondary | ICD-10-CM

## 2018-06-26 DIAGNOSIS — I25119 Atherosclerotic heart disease of native coronary artery with unspecified angina pectoris: Secondary | ICD-10-CM | POA: Diagnosis not present

## 2018-06-26 MED ORDER — ATORVASTATIN CALCIUM 40 MG PO TABS: 40.0000 mg | ORAL_TABLET | Freq: Every day | ORAL | 1 refills | Status: DC

## 2018-06-26 NOTE — Progress Notes (Signed)
Cardiology Office Note  Date: 06/26/2018   ID: Dwayne Garrett, DOB December 23, 1955, MRN [id number]  PCP: Annetta Maw, MD  Primary Cardiologist: Rozann Lesches, MD   Chief Complaint  Patient presents with  . Coronary Artery Disease    History of Present Illness: Dwayne Garrett is a 63 y.o. male last seen in July.  He presents for a routine follow-up visit.  He states that he has not had any angina symptoms or used nitroglycerin since last assessment.  We went over his medications which are listed below.  He stopped Lipitor about a month and a half ago with concerns about joint pains.  Symptoms have improved although not completely resolved.  He is amenable to started back on a lower dose of Lipitor.  He reports compliance with dual antiplatelet therapy.  He is status post DES interventions to the RCA and third LV branch in September 2018.  He has residual left main and LAD disease.  We have continued dual antiplatelet therapy longer-term.  Past Medical History:  Diagnosis Date  . Abnormal LFTs    Cirrhosis per liver biopsy - follows with Dr. Laural Golden  . Bilateral leg edema    Chronic  . 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 Coronary atherosclerosis of native coronary artery    Multivessel (moderate), DES RCA 4/11, LVEF 60-65%. LHC 03/11/17: DES to dist RCA & 3rd left ventricular branch  . Essential hypertension, benign   . 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 OSA (obstructive sleep apnea)    on CPAP    Past Surgical History:  Procedure Laterality Date  . BONE CYST EXCISION     Cyst removal from neck  . CARDIAC CATHETERIZATION N/A 12/11/2014   Procedure: Left Heart Cath and Coronary Angiography;  Surgeon: Burnell Blanks, MD;  Location: [facility];  Service: Cardiovascular;  Laterality: N/A;  . CORONARY STENT INTERVENTION N/A 03/11/2017   Procedure: CORONARY STENT INTERVENTION;  Surgeon: Belva Crome, MD;  Location: [facility];  Service:  Cardiovascular;  Laterality: N/A;  . LEFT HEART CATH AND CORONARY ANGIOGRAPHY N/A 03/11/2017   Procedure: LEFT HEART CATH AND CORONARY ANGIOGRAPHY;  Surgeon: Belva Crome, MD;  Location: [facility];  Service: Cardiovascular;  Laterality: N/A;  . SKIN GRAFT     Finger  . TUMOR REMOVAL     Abdominal  . ULTRASOUND GUIDANCE FOR VASCULAR ACCESS  03/11/2017   Procedure: Ultrasound Guidance For Vascular Access;  Surgeon: Belva Crome, MD;  Location: [facility];  Service: Cardiovascular;;    Current Outpatient Medications  Medication Sig Dispense Refill  . amLODipine (NORVASC) 5 MG tablet Take 1 tablet (5 mg total) by mouth 2 (two) times daily. 60 tablet 2  . aspirin 81 MG chewable tablet Chew 1 tablet (81 mg total) by mouth daily. 30 tablet 11  . losartan (COZAAR) 100 MG tablet Take 100 mg by mouth daily.    . metFORMIN (GLUCOPHAGE) 500 MG tablet Take 1,000 mg by mouth every morning.   3  . Multiple Vitamin (MULTIVITAMIN WITH MINERALS) TABS tablet Take 1 tablet by mouth daily.    Marland Kitchen NITROSTAT 0.4 MG SL tablet PLACE 1 TABLET UNDER TONGUE EVERY FIVE MINUTES AS DIRECTED 25 tablet 3  . Omega-3 Fatty Acids (FISH OIL) 1000 MG CAPS Take 3,000 mg by mouth daily.    Marland Kitchen omeprazole (PRILOSEC) 40 MG capsule TAKE 1 CAPSULE (40 MG TOTAL) BY MOUTH DAILY. [place]  capsule 3  . ONETOUCH VERIO test strip TEST DAILY E11.9  3  . sildenafil (VIAGRA) 100 MG tablet Take 100 mg by mouth daily as needed for erectile dysfunction.    . ticagrelor (BRILINTA) 90 MG TABS tablet Take 1 tablet (90 mg total) by mouth 2 (two) times daily. 180 tablet 0  . atorvastatin (LIPITOR) 40 MG tablet Take 1 tablet (40 mg total) by mouth daily. 90 tablet 1   No current facility-administered medications for this visit.    Allergies:  Patient has no known allergies.   Social History: The patient  reports that he quit smoking about 13 years ago. His smoking use included cigarettes. His smokeless tobacco use includes chew. He  reports that he does not drink alcohol or use drugs.   ROS:  Please see the history of present illness. Otherwise, complete review of systems is positive for arthritic stiffness.  All other systems are reviewed and negative.   Physical Exam: VS:  BP (!) 162/87   Pulse 70   Ht 5' 9"  (1.753 m)   Wt 218 lb 3.2 oz (99 kg)   SpO2 98%   BMI 32.22 kg/m , BMI Body mass index is 32.22 kg/m.  Wt Readings from Last 3 Encounters:  06/26/18 218 lb 3.2 oz (99 kg)  12/27/17 223 lb 9.6 oz (101.4 kg)  06/20/17 219 lb 3.2 oz (99.4 kg)    General: Patient appears comfortable at rest. HEENT: Conjunctiva and lids normal, oropharynx clear. Neck: Supple, no elevated JVP or carotid bruits, no thyromegaly. Lungs: Clear to auscultation, nonlabored breathing at rest. Cardiac: Regular rate and rhythm, no S3 or significant systolic murmur. Abdomen: Soft, nontender, bowel sounds present, no guarding or rebound. Extremities: No pitting edema, distal pulses 2+. Skin: Warm and dry. Musculoskeletal: No kyphosis. Neuropsychiatric: Alert and oriented x3, affect grossly appropriate.  ECG: I personally reviewed the tracing from 12/27/2017 which shows sinus rhythm.  Recent Labwork: 07/26/2017: ALT 29; AST 23  03/12/2017: BUN 11; Creatinine, Ser 0.70; Hemoglobin 13.0; Platelets 227; Potassium 4.0; Sodium 138  Other Studies Reviewed Today:  Cardiac catheterization and PCI 03/11/2017:  Unstable angina/non-ST elevation myocardial infarction presentation.  The previously stented proximal to mid RCA is widely patent  De novo 95-99% distal RCA before the origin of the PDA.  De novo 95% stenosis in the third left ventricular branch of the right coronary artery.  Moderate obstruction in the trunk of the left main, 40%. This is unchanged from prior angiogram.  Moderate eccentric 40-50% proximal to mid LAD. Diffuse 30-50% disease in the mid and distal LAD. The largest previously stented diagonal reveals no evidence of  in-stent restenosis.  Normal left ventricular size and function with mildly elevated LVEDP consistent with diastolic heart failure, asymptomatic. EF 60%.  Successful stenting of the distal RCA and third left ventricular branch reducing stenoses to 0% and 0% with TIMI grade 3 flow. Onyx 3.5 x 18 and 2.25 x 15 DES deployed at 15 atm in distal RCA and third LV branch respectively. TIMI grade 3 flow is noted beyond age treatment area.  RECOMMENDATIONS:   This gentleman needs aggressive risk factor modification he has moderate to moderately severe left main and LAD disease although I do not believe they are critical and surgical therapy at this time would risk graft occlusion because native flow is not impaired.  Aspirin and Brilinta 12 months  Hopeful discharge in a.m.  Assessment and Plan:  1.  Multivessel CAD status post DES to the RCA  and third LV branch in September 2018 with moderate residual left main and LAD disease.  Plan to continue dual antiplatelet therapy although Brilinta at 60 mg twice daily.  2.  Mixed hyperlipidemia.  He did report intolerance to higher dose Lipitor, amenable to starting back on 40 mg daily for now.  He is following lipids through the [place] system.  3.  OSA on CPAP.  4.  Essential hypertension, blood pressure elevated today, he reports compliance with his medications.  Keep follow-up with PCP.  Current medicines were reviewed with the patient today.  Disposition: Follow-up in 6 months.  Signed, Satira Sark, MD, [facility] 06/26/2018 4:52 PM    [facility] at [place], [place], [place] [address] Phone: [phone]; Fax: [phone]

## 2018-06-26 NOTE — Patient Instructions (Signed)
Your physician wants you to follow-up in: [place] will receive a reminder letter in the mail two months in advance. If you don't receive a letter, please call our office to schedule the follow-up appointment.  Your physician has recommended you make the following change in your medication:   START LIPITOR 40 MG DAILY   Thank you for choosing [facility]!!

## 2018-06-27 MED ORDER — TICAGRELOR 60 MG PO TABS: 60.0000 mg | ORAL_TABLET | Freq: Two times a day (BID) | ORAL | 1 refills | Status: DC

## 2018-06-27 NOTE — Addendum Note (Signed)
Addended by: Julian Hy T on: 06/27/2018 03:52 PM   Modules accepted: Orders

## 2018-10-17 ENCOUNTER — Telehealth: Payer: Self-pay | Admitting: Cardiology

## 2018-10-17 MED ORDER — TICAGRELOR 60 MG PO TABS: 60.0000 mg | ORAL_TABLET | Freq: Two times a day (BID) | ORAL | 3 refills | Status: DC

## 2018-10-17 NOTE — Telephone Encounter (Signed)
Per 06/26/2018 office note, patient is to remain on ASA and Brilinta for1 year. According to wife, he ran out of Brilinta a month ago, it looks like refill transmission failed to pharmacy.     Please advise

## 2018-10-17 NOTE — Telephone Encounter (Signed)
I spoke with wife, patient will resume Brilinta 60 mg BID the day after his colonoscopy and EGD

## 2018-10-17 NOTE — Telephone Encounter (Signed)
Wife called asking if patient is suppose to go back on [place] after [facility] with [facility] this Thursday Said patient finished medication about a month ago

## 2018-10-17 NOTE — Telephone Encounter (Signed)
Although he is technically out of the 1 year window from prior DES intervention, we have kept him on dual antiplatelet therapy in light of the extent of his CAD.  If he was tolerating Brilinta at the prior dose, will go ahead and resume.

## 2019-06-20 ENCOUNTER — Telehealth: Payer: Self-pay | Admitting: *Deleted

## 2019-06-20 NOTE — Telephone Encounter (Signed)
Pt is switching to [facility] cardiologist for insurance purposes

## 2019-07-18 ENCOUNTER — Encounter: Payer: Self-pay | Admitting: Gastroenterology

## 2019-07-18 ENCOUNTER — Other Ambulatory Visit: Payer: Self-pay

## 2019-07-18 ENCOUNTER — Ambulatory Visit (INDEPENDENT_AMBULATORY_CARE_PROVIDER_SITE_OTHER): Payer: No Typology Code available for payment source | Admitting: Gastroenterology

## 2019-07-18 VITALS — BP 146/89 | HR 76 | Temp 98.2°F | Ht 69.0 in | Wt 214.0 lb

## 2019-07-18 DIAGNOSIS — K7581 Nonalcoholic steatohepatitis (NASH): Secondary | ICD-10-CM | POA: Diagnosis not present

## 2019-07-18 DIAGNOSIS — K7460 Unspecified cirrhosis of liver: Secondary | ICD-10-CM

## 2019-07-18 NOTE — Progress Notes (Signed)
Dwayne Bellows MD, MRCP(U.K) [address]  [place]  [place], [place] [address]  Main: [phone]  Fax: [phone]   Gastroenterology Consultation  Referring Provider:     [facility] Primary Care Physician:  Annetta Maw, MD Primary Gastroenterologist:  Dr. Jonathon Garrett  Reason for Consultation:     Transfer of care for liver cirrhosis        HPI:   Dwayne Garrett is a 64 y.o. y/o male referred for consultation & management  by Dr. Annetta Maw, MD.     He he carries a diagnosis of Castleman's disease since 2002.  Follows with [facility] Referred to see me for liver cirrhosis.  Last GI note was back in 02/07/2017 when he had seen Dr. Jearld Adjutant.  Per last GI note suggests he has cirrhosis of the liver biopsy-proven to be related to fatty liver.  All prior liver markers for autoimmune hepatitis were negative.  Was compensated in terms of his hepatic function   No recent labs.  Last ultrasound of the abdomen was in November 2018 when an elastography showed features of F2 and F3 fibrosis.  I cannot see any recent endoscopy but he recalls he has had one few years back.  Last colonoscopy was within the past 1 year and was told to return in 5 years from now.  He has not been seen for anybody for his liver disease since 2018.  He wakes up every day in the night every few hours because he needs to pass urine due to his prostate.  No elevation of sleep rhythm.  Normal bowel movements.  Does not drink any alcohol. Past Medical History:  Diagnosis Date  . Abnormal LFTs    Cirrhosis per liver biopsy - follows with Dr. Laural Golden  . Bilateral leg edema    Chronic  . Castleman's disease ([place])   . Coronary atherosclerosis of native coronary artery    Multivessel (moderate), DES RCA 4/11, LVEF 60-65%. LHC 03/11/17: DES to dist RCA & 3rd left ventricular branch  . Essential hypertension, benign   . 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 OSA (obstructive sleep  apnea)    on CPAP    Past Surgical History:  Procedure Laterality Date  . BONE CYST EXCISION     Cyst removal from neck  . CARDIAC CATHETERIZATION N/A 12/11/2014   Procedure: Left Heart Cath and Coronary Angiography;  Surgeon: Burnell Blanks, MD;  Location: [facility];  Service: Cardiovascular;  Laterality: N/A;  . CORONARY STENT INTERVENTION N/A 03/11/2017   Procedure: CORONARY STENT INTERVENTION;  Surgeon: Belva Crome, MD;  Location: [facility];  Service: Cardiovascular;  Laterality: N/A;  . LEFT HEART CATH AND CORONARY ANGIOGRAPHY N/A 03/11/2017   Procedure: LEFT HEART CATH AND CORONARY ANGIOGRAPHY;  Surgeon: Belva Crome, MD;  Location: [facility];  Service: Cardiovascular;  Laterality: N/A;  . SKIN GRAFT     Finger  . TUMOR REMOVAL     Abdominal  . ULTRASOUND GUIDANCE FOR VASCULAR ACCESS  03/11/2017   Procedure: Ultrasound Guidance For Vascular Access;  Surgeon: Belva Crome, MD;  Location: [facility];  Service: Cardiovascular;;    Prior to Admission medications   Medication Sig Start Date End Date Taking? Authorizing Provider  amLODipine (NORVASC) 5 MG tablet Take 1 tablet (5 mg total) by mouth 2 (two) times daily. 04/07/17   Satira Sark, MD  aspirin 81 MG chewable tablet  Chew 1 tablet (81 mg total) by mouth daily. 03/12/17   Daune Perch, NP  atorvastatin (LIPITOR) 40 MG tablet Take 1 tablet (40 mg total) by mouth daily. 06/26/18 09/24/18  Satira Sark, MD  losartan (COZAAR) 100 MG tablet Take 100 mg by mouth daily.    [provider]  metFORMIN (GLUCOPHAGE) 500 MG tablet Take 1,000 mg by mouth every morning.  11/27/14   [provider]  Multiple Vitamin (MULTIVITAMIN WITH MINERALS) TABS tablet Take 1 tablet by mouth daily.    [provider]  NITROSTAT 0.4 MG SL tablet PLACE 1 TABLET UNDER TONGUE EVERY FIVE MINUTES AS DIRECTED 12/16/15   Satira Sark, MD  Omega-3 Fatty Acids (FISH OIL) 1000 MG  CAPS Take 3,000 mg by mouth daily.    [provider]  omeprazole (PRILOSEC) 40 MG capsule TAKE 1 CAPSULE (40 MG TOTAL) BY MOUTH DAILY. 01/24/17   Setzer, Rona Ravens, NP  ONETOUCH VERIO test strip TEST DAILY E11.9 10/24/14   [provider]  sildenafil (VIAGRA) 100 MG tablet Take 100 mg by mouth daily as needed for erectile dysfunction.    [provider]  ticagrelor (BRILINTA) 60 MG TABS tablet Take 1 tablet (60 mg total) by mouth 2 (two) times daily. 10/17/18   Satira Sark, MD    Family History  Problem Relation Age of Onset  . Heart attack Brother 96     Social History   Tobacco Use  . Smoking status: Former Smoker    Types: Cigarettes    Quit date: 07/01/2004    Years since quitting: 15.0  . Smokeless tobacco: Current User    Types: Chew  . 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 Alcohol use: No  . Drug use: No    Allergies as of 07/18/2019  . (No Known Allergies)    Review of Systems:    All systems reviewed and negative except where noted in HPI.   Physical Exam:  There were no vitals taken for this visit. No LMP for male patient. Psych:  Alert and cooperative. Normal mood and affect. General:   Alert,  Well-developed, well-nourished, pleasant and cooperative in NAD Head:  Normocephalic and atraumatic. Eyes:  Sclera clear, no icterus.   Conjunctiva pink. Ears:  Normal auditory acuity. Lungs:  Respirations even and unlabored.  Clear throughout to auscultation.   No wheezes, crackles, or rhonchi. No acute distress. Heart:  Regular rate and rhythm; no murmurs, clicks, rubs, or gallops. Abdomen: Midline central scar ,right lower quadrant scar normal bowel sounds.  No bruits.  Soft, non-tender and non-distended without masses, hepatosplenomegaly or hernias noted.  No guarding or rebound tenderness.    Msk:  Symmetrical without gross deformities. Good, equal movement & strength bilaterally. Neurologic:   Alert and oriented x3;  grossly normal neurologically. Psych:  Alert and cooperative. Normal mood and affect.  Imaging Studies: No results found.  Assessment and Plan:   Dwayne Garrett is a 64 y.o. y/o male has been referred for transfer of care for liver cirrhosis, compensated secondary to [place].  Last seen a gastroenterologist in 2018.   Plan 1.  CBC, CMP, INR, right upper quadrant ultrasound to screen for [place]. 2.  EGD to screen for esophageal varices. 3.  Low-salt diet, diet control of risk factors such as hypertension and diabetes.  Regular exercise.  Check immune status to hepatitis A and B.  Discussed need for vaccination  against COVID-19.  I have discussed alternative options, risks & benefits,  which include, but are not limited to, bleeding, infection, perforation,respiratory complication & drug reaction.  The patient agrees with this plan & written consent will be obtained.     Follow up in 6 months   Dr Dwayne Bellows MD,MRCP(U.K)

## 2019-07-19 ENCOUNTER — Other Ambulatory Visit: Payer: Self-pay

## 2019-07-19 ENCOUNTER — Emergency Department (HOSPITAL_COMMUNITY)
Admission: EM | Admit: 2019-07-19 | Discharge: 2019-07-19 | Disposition: A | Discharge status: Home / Self Care | Payer: No Typology Code available for payment source | Attending: Emergency Medicine | Admitting: Emergency Medicine

## 2019-07-19 ENCOUNTER — Emergency Department (HOSPITAL_COMMUNITY): Payer: No Typology Code available for payment source

## 2019-07-19 ENCOUNTER — Encounter (HOSPITAL_COMMUNITY): Payer: Self-pay | Admitting: *Deleted

## 2019-07-19 DIAGNOSIS — I2510 Atherosclerotic heart disease of native coronary artery without angina pectoris: Secondary | ICD-10-CM | POA: Insufficient documentation

## 2019-07-19 DIAGNOSIS — M5442 Lumbago with sciatica, left side: Secondary | ICD-10-CM | POA: Insufficient documentation

## 2019-07-19 DIAGNOSIS — Z79899 Other long term (current) drug therapy: Secondary | ICD-10-CM | POA: Diagnosis not present

## 2019-07-19 DIAGNOSIS — R030 Elevated blood-pressure reading, without diagnosis of hypertension: Secondary | ICD-10-CM

## 2019-07-19 DIAGNOSIS — I119 Hypertensive heart disease without heart failure: Secondary | ICD-10-CM | POA: Diagnosis not present

## 2019-07-19 DIAGNOSIS — M545 Low back pain: Secondary | ICD-10-CM | POA: Diagnosis present

## 2019-07-19 LAB — CBC
Hematocrit: 46.3 % (ref 37.5–51.0)
Hemoglobin: 16 g/dL (ref 13.0–17.7)
MCH: 30.9 pg (ref 26.6–33.0)
MCHC: 34.6 g/dL (ref 31.5–35.7)
MCV: 90 fL (ref 79–97)
Platelets: 222 10*3/uL (ref 150–450)
RBC: 5.17 x10E6/uL (ref 4.14–5.80)
RDW: 12.2 % (ref 11.6–15.4)
WBC: 8.5 10*3/uL (ref 3.4–10.8)

## 2019-07-19 LAB — COMPREHENSIVE METABOLIC PANEL
ALT: 130 IU/L — ABNORMAL HIGH (ref 0–44)
AST: 76 IU/L — ABNORMAL HIGH (ref 0–40)
Albumin/Globulin Ratio: 1.6 (ref 1.2–2.2)
Albumin: 4.2 g/dL (ref 3.8–4.8)
Alkaline Phosphatase: 91 IU/L (ref 39–117)
BUN/Creatinine Ratio: 24 (ref 10–24)
BUN: 17 mg/dL (ref 8–27)
Bilirubin Total: 0.7 mg/dL (ref 0.0–1.2)
CO2: 25 mmol/L (ref 20–29)
Calcium: 10 mg/dL (ref 8.6–10.2)
Chloride: 101 mmol/L (ref 96–106)
Creatinine, Ser: 0.7 mg/dL — ABNORMAL LOW (ref 0.76–1.27)
GFR calc Af Amer: 118 mL/min/{1.73_m2} (ref >59)
GFR calc non Af Amer: 102 mL/min/{1.73_m2} (ref >59)
Globulin, Total: 2.7 g/dL (ref 1.5–4.5)
Glucose: 279 mg/dL — ABNORMAL HIGH (ref 65–99)
Potassium: 4.5 mmol/L (ref 3.5–5.2)
Sodium: 141 mmol/L (ref 134–144)
Total Protein: 6.9 g/dL (ref 6.0–8.5)

## 2019-07-19 LAB — PROTIME-INR
INR: 1 (ref 0.9–1.2)
Prothrombin Time: 10.3 s (ref 9.1–12.0)

## 2019-07-19 LAB — HEPATITIS A ANTIBODY, TOTAL: hep A Total Ab: NEGATIVE

## 2019-07-19 LAB — HEPATITIS B SURFACE ANTIBODY,QUALITATIVE: Hep B Surface Ab, Qual: NONREACTIVE

## 2019-07-19 MED ORDER — PREDNISONE 50 MG PO TABS
60.0000 mg | ORAL_TABLET | Freq: Once | ORAL | Status: AC
  Administered 2019-07-19: 60 mg via ORAL
  Filled 2019-07-19: qty 1

## 2019-07-19 MED ORDER — LIDOCAINE 5 % EX PTCH: 1.0000 | MEDICATED_PATCH | CUTANEOUS | 0 refills | Status: DC

## 2019-07-19 MED ORDER — LOSARTAN POTASSIUM 25 MG PO TABS
100.0000 mg | ORAL_TABLET | Freq: Every day | ORAL | Status: DC
  Administered 2019-07-19: 16:00:00 100 mg via ORAL
  Filled 2019-07-19: qty 4

## 2019-07-19 MED ORDER — PREDNISONE 50 MG PO TABS: 50.0000 mg | ORAL_TABLET | Freq: Every day | ORAL | 0 refills | Status: AC

## 2019-07-19 MED ORDER — HYDROMORPHONE HCL 1 MG/ML IJ SOLN
1.0000 mg | Freq: Once | INTRAMUSCULAR | Status: AC
  Administered 2019-07-19: 16:00:00 1 mg via INTRAMUSCULAR
  Filled 2019-07-19: qty 1

## 2019-07-19 MED ORDER — LIDOCAINE 5 % EX PTCH
1.0000 | MEDICATED_PATCH | CUTANEOUS | Status: DC
  Administered 2019-07-19: 16:00:00 1 via TRANSDERMAL
  Filled 2019-07-19: qty 1

## 2019-07-19 MED ORDER — AMLODIPINE BESYLATE 5 MG PO TABS
5.0000 mg | ORAL_TABLET | Freq: Two times a day (BID) | ORAL | Status: DC
  Administered 2019-07-19: 16:00:00 5 mg via ORAL
  Filled 2019-07-19: qty 1

## 2019-07-19 MED ORDER — OXYCODONE HCL 5 MG PO TABS: 5.0000 mg | ORAL_TABLET | ORAL | 0 refills | Status: DC | PRN

## 2019-07-19 MED ORDER — KETOROLAC TROMETHAMINE 30 MG/ML IJ SOLN
30.0000 mg | Freq: Once | INTRAMUSCULAR | Status: AC
  Administered 2019-07-19: 16:00:00 30 mg via INTRAMUSCULAR
  Filled 2019-07-19: qty 1

## 2019-07-19 NOTE — Discharge Instructions (Signed)
Take the pain medication as prescribed.  This medication may become addictive and may make you drowsy.  Do not drive or operate heavy machinery while taking this medicine.  I prescribed you steroids.  You will need to check your blood sugars frequently.  If greater than 250 you need to stop taking the steroids.  Leave the lidocaine patches on for 12 hours.  You will need to be patch free for 12 hours before he completes an additional patch.  Follow-up with orthopedics if your symptoms do not resolve.  Return to the emergency department if you develop any worsening symptoms.  Continue to take your home medications.

## 2019-07-19 NOTE — ED Triage Notes (Signed)
Pt woke up with left lower back pain that radiates down left leg.  Pt states that left leg is numb.

## 2019-07-19 NOTE — ED Provider Notes (Signed)
[facility] EMERGENCY DEPARTMENT Provider Note   CSN: [id number] Arrival date & time: 07/19/19  1359     History Chief Complaint  Patient presents with  . Back Pain    Dwayne Garrett is a 64 y.o. male with past medical history significant for abnormal LFTs, chronic bilateral leg edema, Castleman's disease, CAD, hypertension who presents for evaluation of back pain.  Patient states he woke up this morning and had severe left sided back pain.  Pain begins at his left gluteus, radiates down his posterior left leg and wraps around to the lateral aspect of his calf.  Patient states he has had some decreased sensation to the lateral aspect of his left leg.  He rates his pain a 10/10.  Pain worse with movement, particularly standing up straight.  No recent injury or trauma.  He denies any chronic back pain.  No IV drug use, bowel or bladder incontinence, saddle paresthesia, malignancy or nighttime pain.  Patient states he has not taken anything for his pain.  He also did not take any of his chronic medications this morning, particularly his hypertensive medications and his Metformin.  No fever, chills, nausea, vomiting, lightheadedness, dizziness, chest pain, shortness breath abdominal pain, diarrhea, dysuria, lateral leg swelling, redness or warmth.  He admits to remote history of left knee replacement greater than 4 years ago.  Admits to full range of motion to his bilateral lower extremities without rashes, lesions, swelling.  Denies additional aggravating or alleviating factors.  History obtained from patient and past medical records.  No interpreter is used.  HPI     Past Medical History:  Diagnosis Date  . Abnormal LFTs    Cirrhosis per liver biopsy - follows with Dr. Laural Golden  . Bilateral leg edema    Chronic  . Castleman's disease ([place])   . Coronary atherosclerosis of native coronary artery    Multivessel (moderate), DES RCA 4/11, LVEF 60-65%. LHC 03/11/17: DES to dist RCA & 3rd left  ventricular branch  . Essential hypertension, benign   . 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 OSA (obstructive sleep apnea)    on CPAP    Patient Active Problem List   Diagnosis Date Noted  . Unstable angina ([place]) 03/10/2017  . NSTEMI (non-ST elevated myocardial infarction) ([place]) 03/10/2017  . HTN (hypertension) 03/10/2017  . Diabetes mellitus type 2 with complications ([place]) 29/92/4268  . GERD (gastroesophageal reflux disease) 03/10/2017  . Coronary artery disease involving native coronary artery of native heart without angina pectoris   . Preoperative cardiovascular examination 03/06/2014  . Lower extremity edema 04/20/2011  . Mixed hyperlipidemia 05/04/2010  . SLEEP APNEA, OBSTRUCTIVE 10/20/2009  . Coronary atherosclerosis of native coronary artery 10/20/2009    Past Surgical History:  Procedure Laterality Date  . BONE CYST EXCISION     Cyst removal from neck  . CARDIAC CATHETERIZATION N/A 12/11/2014   Procedure: Left Heart Cath and Coronary Angiography;  Surgeon: Burnell Blanks, MD;  Location: [facility];  Service: Cardiovascular;  Laterality: N/A;  . CORONARY STENT INTERVENTION N/A 03/11/2017   Procedure: CORONARY STENT INTERVENTION;  Surgeon: Belva Crome, MD;  Location: [facility];  Service: Cardiovascular;  Laterality: N/A;  . LEFT HEART CATH AND CORONARY ANGIOGRAPHY N/A 03/11/2017   Procedure: LEFT HEART CATH AND CORONARY ANGIOGRAPHY;  Surgeon: Belva Crome, MD;  Location: [facility];  Service: Cardiovascular;  Laterality: N/A;  . SKIN GRAFT     Finger  .  TUMOR REMOVAL     Abdominal  . ULTRASOUND GUIDANCE FOR VASCULAR ACCESS  03/11/2017   Procedure: Ultrasound Guidance For Vascular Access;  Surgeon: Belva Crome, MD;  Location: [facility];  Service: Cardiovascular;;       Family History  Problem Relation Age of Onset  . Heart attack Brother 87    Social History   Tobacco Use  . Smoking status: Former  Smoker    Types: Cigarettes    Quit date: 07/01/2004    Years since quitting: 15.0  . Smokeless tobacco: Current User    Types: Chew  . 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 Alcohol use: No  . Drug use: No    Home Medications Prior to Admission medications   Medication Sig Start Date End Date Taking? Authorizing Provider  amLODipine (NORVASC) 5 MG tablet Take 1 tablet (5 mg total) by mouth 2 (two) times daily. 04/07/17   Satira Sark, MD  aspirin 81 MG chewable tablet Chew 1 tablet (81 mg total) by mouth daily. 03/12/17   Daune Perch, NP  atorvastatin (LIPITOR) 40 MG tablet Take 1 tablet (40 mg total) by mouth daily. 06/26/18 09/24/18  Satira Sark, MD  lidocaine (LIDODERM) 5 % Place 1 patch onto the skin daily. Remove & Discard patch within 12 hours or as directed by MD 07/19/19   Raynisha Avilla A, PA-C  losartan (COZAAR) 100 MG tablet Take 100 mg by mouth daily.    [provider]  metFORMIN (GLUCOPHAGE) 500 MG tablet Take 1,000 mg by mouth every morning.  11/27/14   [provider]  Multiple Vitamin (MULTIVITAMIN WITH MINERALS) TABS tablet Take 1 tablet by mouth daily.    [provider]  NITROSTAT 0.4 MG SL tablet PLACE 1 TABLET UNDER TONGUE EVERY FIVE MINUTES AS DIRECTED 12/16/15   Satira Sark, MD  Omega-3 Fatty Acids (FISH OIL) 1000 MG CAPS Take 3,000 mg by mouth daily.    [provider]  omeprazole (PRILOSEC) 40 MG capsule TAKE 1 CAPSULE (40 MG TOTAL) BY MOUTH DAILY. 01/24/17   Setzer, Rona Ravens, NP  ONETOUCH VERIO test strip TEST DAILY E11.9 10/24/14   [provider]  oxyCODONE (ROXICODONE) 5 MG immediate release tablet Take 1 tablet (5 mg total) by mouth every 4 (four) hours as needed for severe pain. 07/19/19   Edwin Cherian A, PA-C  predniSONE (DELTASONE) 50 MG tablet Take 1 tablet (50 mg total) by mouth daily for 4 days. 07/19/19 07/23/19  Webb Weed A, PA-C  sildenafil  (VIAGRA) 100 MG tablet Take 100 mg by mouth daily as needed for erectile dysfunction.    [provider]  ticagrelor (BRILINTA) 60 MG TABS tablet Take 1 tablet (60 mg total) by mouth 2 (two) times daily. Patient not taking: Reported on 07/18/2019 10/17/18   Satira Sark, MD    Allergies    Nyquil multi-symptom [pseudoeph-doxylamine-dm-apap] and Tylenol [acetaminophen]  Review of Systems   Review of Systems  Constitutional: Negative.   HENT: Negative.   Respiratory: Negative.   Cardiovascular: Negative.   Gastrointestinal: Negative.   Genitourinary: Negative.   Musculoskeletal: Positive for back pain and gait problem (Pain with walking). Negative for arthralgias, joint swelling, myalgias, neck pain and neck stiffness.  Skin: Negative.   Neurological: Positive for numbness (Decreased sensation to left poster glut and left lateral calf). Negative for dizziness, tremors, seizures, syncope, facial asymmetry, speech difficulty, weakness,  light-headedness and headaches.  All other systems reviewed and are negative.   Physical Exam Updated Vital Signs BP (!) 169/92 (BP Location: Right Arm)   Pulse 82   Temp 97.7 F (36.5 C) (Oral)   Resp 14   Ht 5' 9"  (1.753 m)   Wt 97.1 kg   SpO2 97%   BMI 31.60 kg/m   Physical Exam Physical Exam  Constitutional: Pt appears well-developed and well-nourished. No distress.  HENT:  Head: Normocephalic and atraumatic.  Mouth/Throat: Oropharynx is clear and moist. No oropharyngeal exudate.  Eyes: Conjunctivae are normal.  Neck: Normal range of motion. Neck supple.  Full ROM without pain  Cardiovascular: Normal rate, regular rhythm and intact distal pulses.   Pulmonary/Chest: Effort normal and breath sounds normal. No respiratory distress. Pt has no wheezes.  Abdominal: Soft. Pt exhibits no distension. There is no tenderness, rebound or guarding. No abd bruit or pulsatile mass Musculoskeletal:  Full range of motion of the T-spine and  L-spine with flexion, hyperextension, and lateral flexion. No midline tenderness or stepoffs. No tenderness to palpation of the spinous processes of the T-spine or L-spine. Moderate tenderness to palpation of the paraspinous muscles of the LEFT L-spin over piriformis and SI joint. Positive straight leg raise on left at 40'. Lymphadenopathy:    Pt has no cervical adenopathy.  Neurological: Pt is alert. Pt has normal reflexes.  Reflex Scores:      Bicep reflexes are 2+ on the right side and 2+ on the left side.      Brachioradialis reflexes are 2+ on the right side and 2+ on the left side.      Patellar reflexes are 2+ on the right side and 2+ on the left side.      Achilles reflexes are 2+ on the right side and 2+ on the left side. Speech is clear and goal oriented, follows commands Normal 5/5 strength in upper and lower extremities bilaterally including dorsiflexion and plantar flexion, strong and equal grip strength Sensation normal to light and sharp touch Moves extremities without ataxia, coordination intact Normal gait Normal balance No Clonus Skin: Skin is warm and dry. No rash noted or lesions noted. Pt is not diaphoretic. No erythema, ecchymosis,edema or warmth.  Psychiatric: Pt has a normal mood and affect. Behavior is normal.  Nursing note and vitals reviewed. ED Results / Procedures / Treatments   Labs (all labs ordered are listed, but only abnormal results are displayed) Labs Reviewed - No data to display  EKG None  Radiology DG Lumbar Spine Complete  Result Date: 07/19/2019 CLINICAL DATA:  64 year old male with low back pain radiating to the left leg with numbness. No known injury. EXAM: LUMBAR SPINE - COMPLETE 4+ VIEW COMPARISON:  None. FINDINGS: Normal lumbar segmentation. Relatively preserved lordosis. Bone mineralization is within normal limits. No pars fracture. Visible lower thoracic levels, sacrum and SI joints appear intact. No acute osseous abnormality identified.  Moderate to severe disc space loss and endplate spurring at J1-H4. Relatively preserved disc spaces elsewhere with various degrees of endplate spurring. Negative abdominal visceral contours. IMPRESSION: 1. No acute osseous abnormality identified in the lumbar spine. 2. Moderate to severe chronic disc and endplate degeneration at L5-S1. Electronically Signed   By: Genevie Ann M.D.   On: 07/19/2019 15:49    Procedures Procedures (including critical care time)  Medications Ordered in ED Medications  amLODipine (NORVASC) tablet 5 mg (5 mg Oral Given 07/19/19 1556)  losartan (COZAAR) tablet 100 mg (100 mg Oral  Given 07/19/19 1556)  lidocaine (LIDODERM) 5 % 1 patch (1 patch Transdermal Patch Applied 07/19/19 1557)  ketorolac (TORADOL) 30 MG/ML injection 30 mg (30 mg Intramuscular Given 07/19/19 1556)  HYDROmorphone (DILAUDID) injection 1 mg (1 mg Intramuscular Given 07/19/19 1556)  predniSONE (DELTASONE) tablet 60 mg (60 mg Oral Given 07/19/19 1557)    ED Course  I have reviewed the triage vital signs and the nursing notes.  Pertinent labs & imaging results that were available during my care of the patient were reviewed by me and considered in my medical decision making (see chart for details).  64 year old male presents for evaluation of left-sided back pain.  He is afebrile, nonseptic, non-ill-appearing.  Left pain starts at left SI/piriformis area and radiates into left posterior buttocks and into left lateral calf.  Has some mild decrease sensation to left lateral calf however has good strength.  No overlying skin changes.  He has positive straight leg raise on left.  No red flags for back pain, no fever, chills, IV drug use, bowel or bladder incontinence, saddle paresthesia, malignancy or increased nighttime pain.  No recent injury or trauma.  Pain and paresthesias in distribution of L3-L4.  Likely sciatica.  Unfortunately he is significantly hypertensive at 205/107.  He did not take his blood pressure  medications at home.  We will give him his home blood pressure medicines here.  He denies any chest pain, shortness of breath, headache, lightheadedness, dizziness, nausea or vomiting or abdominal pain.  I low suspicion for hypertensive urgency or emergency.  Likely hypertensive due to him not taking his blood pressure medications today.  He is diabetic however does check his blood sugars.  Have been running in the mid 100s, per patient.  Will give 1 dose of steroids, pain medicine, plain film and reevaluate.  Plain film without acute abnormality. BP reassessed with improvement to 169/92. Will give his home meds. Suspect elevated BP from pain and lack of home medications.  Pain reassessed significant improvement.  He is ambulatory in room.  Low suspicion for discitis, osteomyelitis, transverse myelitis, cauda equina, psoas abscess, acute vascular occlusion, AAA.  Pain likely sciatica.  Will send home with short course of pain medicine, steroids.  Discussed with patient sensation of steroids of his blood sugars are elevated.  Discussed strict return precautions.  Patient voiced understanding and is agreeable follow-up.  The patient has been appropriately medically screened and/or stabilized in the ED. I have low suspicion for any other emergent medical condition which would require further screening, evaluation or treatment in the ED or require inpatient management.     MDM Rules/Calculators/A&P                       Final Clinical Impression(s) / ED Diagnoses Final diagnoses:  Acute left-sided low back pain with left-sided sciatica  Elevated blood pressure reading    Rx / DC Orders ED Discharge Orders         Ordered    oxyCODONE (ROXICODONE) 5 MG immediate release tablet  Every 4 hours PRN     07/19/19 1636    lidocaine (LIDODERM) 5 %  Every 24 hours     07/19/19 1636    predniSONE (DELTASONE) 50 MG tablet  Daily     07/19/19 1636           Kaidynce Pfister A, PA-C 07/19/19 1639     Milton Ferguson, MD 07/20/19 1003

## 2019-07-22 ENCOUNTER — Encounter: Payer: Self-pay | Admitting: Gastroenterology

## 2019-07-24 ENCOUNTER — Telehealth: Payer: Self-pay

## 2019-07-24 ENCOUNTER — Ambulatory Visit (INDEPENDENT_AMBULATORY_CARE_PROVIDER_SITE_OTHER): Payer: No Typology Code available for payment source | Admitting: Gastroenterology

## 2019-07-24 ENCOUNTER — Other Ambulatory Visit: Payer: Self-pay

## 2019-07-24 DIAGNOSIS — K7581 Nonalcoholic steatohepatitis (NASH): Secondary | ICD-10-CM

## 2019-07-24 DIAGNOSIS — K7460 Unspecified cirrhosis of liver: Secondary | ICD-10-CM

## 2019-07-24 DIAGNOSIS — Z23 Encounter for immunization: Secondary | ICD-10-CM | POA: Diagnosis not present

## 2019-07-24 DIAGNOSIS — R7401 Elevation of levels of liver transaminase levels: Secondary | ICD-10-CM

## 2019-07-24 NOTE — Telephone Encounter (Signed)
Patient verbalized understanding of lab results. Patient states she will get his vaccine today. Patient will come in 4 weeks for repeat labs. Patient is not drinking

## 2019-07-24 NOTE — Telephone Encounter (Signed)
-----   Message from Jonathon Bellows, MD sent at 07/22/2019 12:24 PM EST ----- Inform  1. Needs Hep A/ B vaccine 2. AST/ALT slightly higher than baseline- check if was drinking any alcohol if yes stop and recheck LFT's and GGT in 4 weeks , if not drinking still recheckl in 4 weeks

## 2019-07-25 ENCOUNTER — Ambulatory Visit (HOSPITAL_COMMUNITY)
Admission: RE | Admit: 2019-07-25 | Discharge: 2019-07-25 | Disposition: A | Discharge status: Home / Self Care | Payer: No Typology Code available for payment source | Source: Ambulatory Visit | Attending: Gastroenterology | Admitting: Gastroenterology

## 2019-07-25 DIAGNOSIS — K7581 Nonalcoholic steatohepatitis (NASH): Secondary | ICD-10-CM | POA: Insufficient documentation

## 2019-07-25 DIAGNOSIS — K7460 Unspecified cirrhosis of liver: Secondary | ICD-10-CM | POA: Diagnosis present

## 2019-08-06 ENCOUNTER — Telehealth: Payer: Self-pay

## 2019-08-06 NOTE — Telephone Encounter (Signed)
Called and left a message for call back to scheduled patient for a EGD. We received clearance from provider

## 2019-08-10 DIAGNOSIS — M5416 Radiculopathy, lumbar region: Secondary | ICD-10-CM | POA: Insufficient documentation

## 2019-08-12 ENCOUNTER — Encounter: Payer: Self-pay | Admitting: Gastroenterology

## 2019-08-15 ENCOUNTER — Other Ambulatory Visit: Payer: Self-pay

## 2019-08-15 DIAGNOSIS — K7581 Nonalcoholic steatohepatitis (NASH): Secondary | ICD-10-CM

## 2019-08-15 DIAGNOSIS — K7460 Unspecified cirrhosis of liver: Secondary | ICD-10-CM

## 2019-08-15 NOTE — Telephone Encounter (Signed)
Called and left a message for call back  

## 2019-08-15 NOTE — Telephone Encounter (Signed)
Spoke with pt and was able to schedule the endoscopy procedure.

## 2019-08-21 ENCOUNTER — Ambulatory Visit (INDEPENDENT_AMBULATORY_CARE_PROVIDER_SITE_OTHER): Payer: PRIVATE HEALTH INSURANCE | Admitting: Gastroenterology

## 2019-08-21 ENCOUNTER — Other Ambulatory Visit: Payer: Self-pay

## 2019-08-21 DIAGNOSIS — R7401 Elevation of levels of liver transaminase levels: Secondary | ICD-10-CM

## 2019-08-21 DIAGNOSIS — Z23 Encounter for immunization: Secondary | ICD-10-CM

## 2019-08-21 DIAGNOSIS — K7581 Nonalcoholic steatohepatitis (NASH): Secondary | ICD-10-CM

## 2019-08-21 DIAGNOSIS — K7460 Unspecified cirrhosis of liver: Secondary | ICD-10-CM

## 2019-08-22 LAB — HEPATIC FUNCTION PANEL
ALT: 95 IU/L — ABNORMAL HIGH (ref 0–44)
AST: 54 IU/L — ABNORMAL HIGH (ref 0–40)
Albumin: 4.1 g/dL (ref 3.8–4.8)
Alkaline Phosphatase: 94 IU/L (ref 39–117)
Bilirubin Total: 0.9 mg/dL (ref 0.0–1.2)
Bilirubin, Direct: 0.21 mg/dL (ref 0.00–0.40)
Total Protein: 6.6 g/dL (ref 6.0–8.5)

## 2019-08-22 LAB — GAMMA GT: GGT: 65 IU/L (ref 0–65)

## 2019-08-28 ENCOUNTER — Telehealth: Payer: Self-pay

## 2019-08-28 NOTE — Telephone Encounter (Signed)
-----   Message from Jonathon Bellows, MD sent at 08/28/2019 12:24 PM EST ----- Inform the LFTs are lower than what they were a month back.  GGT was normal suggesting no active liver inflammation.  Recheck in 3 months along with GGT and CK please.  Limit alcohol use if consuming.  Continue weight loss and exercise.  Healthy eating

## 2019-08-28 NOTE — Telephone Encounter (Signed)
Called pt to inform him of lab results and Dr. Georgeann Oppenheim recommendations.  Unable to contact, LVM to return call

## 2019-08-29 ENCOUNTER — Other Ambulatory Visit: Payer: Self-pay

## 2019-08-29 DIAGNOSIS — K7460 Unspecified cirrhosis of liver: Secondary | ICD-10-CM

## 2019-08-29 NOTE — Telephone Encounter (Signed)
Spoke with pt and informed him of lab results and Dr. Georgeann Oppenheim recommendations. Pt understands and agrees.

## 2019-09-03 ENCOUNTER — Other Ambulatory Visit
Admission: RE | Admit: 2019-09-03 | Discharge: 2019-09-03 | Disposition: A | Discharge status: Home / Self Care | Payer: No Typology Code available for payment source | Source: Ambulatory Visit | Attending: Gastroenterology | Admitting: Gastroenterology

## 2019-09-03 DIAGNOSIS — Z01812 Encounter for preprocedural laboratory examination: Secondary | ICD-10-CM | POA: Insufficient documentation

## 2019-09-03 DIAGNOSIS — Z20822 Contact with and (suspected) exposure to covid-19: Secondary | ICD-10-CM | POA: Insufficient documentation

## 2019-09-03 LAB — SARS CORONAVIRUS 2 (TAT 6-24 HRS): SARS Coronavirus 2: NEGATIVE

## 2019-09-04 ENCOUNTER — Other Ambulatory Visit: Payer: No Typology Code available for payment source

## 2019-09-06 ENCOUNTER — Ambulatory Visit
Admission: RE | Admit: 2019-09-06 | Discharge: 2019-09-06 | Disposition: A | Discharge status: Home / Self Care | Payer: No Typology Code available for payment source | Attending: Gastroenterology | Admitting: Gastroenterology

## 2019-09-06 ENCOUNTER — Encounter: Payer: Self-pay | Admitting: Gastroenterology

## 2019-09-06 ENCOUNTER — Ambulatory Visit: Payer: No Typology Code available for payment source | Admitting: Certified Registered Nurse Anesthetist

## 2019-09-06 ENCOUNTER — Encounter
Admission: RE | Disposition: A | Discharge status: Home / Self Care | Payer: Self-pay | Source: Home / Self Care | Attending: Gastroenterology

## 2019-09-06 DIAGNOSIS — Z955 Presence of coronary angioplasty implant and graft: Secondary | ICD-10-CM | POA: Insufficient documentation

## 2019-09-06 DIAGNOSIS — G4733 Obstructive sleep apnea (adult) (pediatric): Secondary | ICD-10-CM | POA: Insufficient documentation

## 2019-09-06 DIAGNOSIS — Z886 Allergy status to analgesic agent status: Secondary | ICD-10-CM | POA: Diagnosis not present

## 2019-09-06 DIAGNOSIS — K7460 Unspecified cirrhosis of liver: Secondary | ICD-10-CM | POA: Diagnosis present

## 2019-09-06 DIAGNOSIS — K228 Other specified diseases of esophagus: Secondary | ICD-10-CM | POA: Diagnosis not present

## 2019-09-06 DIAGNOSIS — I2510 Atherosclerotic heart disease of native coronary artery without angina pectoris: Secondary | ICD-10-CM | POA: Diagnosis not present

## 2019-09-06 DIAGNOSIS — Z7982 Long term (current) use of aspirin: Secondary | ICD-10-CM | POA: Diagnosis not present

## 2019-09-06 DIAGNOSIS — K2270 Barrett's esophagus without dysplasia: Secondary | ICD-10-CM | POA: Diagnosis not present

## 2019-09-06 DIAGNOSIS — D47Z2 Castleman disease: Secondary | ICD-10-CM | POA: Diagnosis not present

## 2019-09-06 DIAGNOSIS — K7581 Nonalcoholic steatohepatitis (NASH): Secondary | ICD-10-CM | POA: Diagnosis not present

## 2019-09-06 DIAGNOSIS — Z8249 Family history of ischemic heart disease and other diseases of the circulatory system: Secondary | ICD-10-CM | POA: Diagnosis not present

## 2019-09-06 DIAGNOSIS — F17210 Nicotine dependence, cigarettes, uncomplicated: Secondary | ICD-10-CM | POA: Insufficient documentation

## 2019-09-06 DIAGNOSIS — I252 Old myocardial infarction: Secondary | ICD-10-CM | POA: Diagnosis not present

## 2019-09-06 DIAGNOSIS — Z79899 Other long term (current) drug therapy: Secondary | ICD-10-CM | POA: Insufficient documentation

## 2019-09-06 DIAGNOSIS — Z7984 Long term (current) use of oral hypoglycemic drugs: Secondary | ICD-10-CM | POA: Diagnosis not present

## 2019-09-06 DIAGNOSIS — D130 Benign neoplasm of esophagus: Secondary | ICD-10-CM | POA: Diagnosis not present

## 2019-09-06 DIAGNOSIS — I10 Essential (primary) hypertension: Secondary | ICD-10-CM | POA: Insufficient documentation

## 2019-09-06 HISTORY — PX: ESOPHAGOGASTRODUODENOSCOPY (EGD) WITH PROPOFOL: SHX5813

## 2019-09-06 LAB — GLUCOSE, CAPILLARY: Glucose-Capillary: 285 mg/dL — ABNORMAL HIGH (ref 70–99)

## 2019-09-06 SURGERY — ESOPHAGOGASTRODUODENOSCOPY (EGD) WITH PROPOFOL
Anesthesia: General

## 2019-09-06 MED ORDER — SODIUM CHLORIDE 0.9 % IV SOLN
INTRAVENOUS | Status: DC
  Administered 2019-09-06: 1000 mL via INTRAVENOUS

## 2019-09-06 MED ORDER — PROPOFOL 500 MG/50ML IV EMUL
INTRAVENOUS | Status: DC | PRN
  Administered 2019-09-06: 150 ug/kg/min via INTRAVENOUS

## 2019-09-06 MED ORDER — PROPOFOL 500 MG/50ML IV EMUL
INTRAVENOUS | Status: AC
  Filled 2019-09-06: qty 50

## 2019-09-06 MED ORDER — INSULIN ASPART 100 UNIT/ML ~~LOC~~ SOLN
SUBCUTANEOUS | Status: AC
  Administered 2019-09-06: 09:00:00 5 [IU] via SUBCUTANEOUS
  Filled 2019-09-06: qty 1

## 2019-09-06 MED ORDER — PROPOFOL 10 MG/ML IV BOLUS
INTRAVENOUS | Status: DC | PRN
  Administered 2019-09-06: 70 mg via INTRAVENOUS

## 2019-09-06 MED ORDER — INSULIN ASPART 100 UNIT/ML ~~LOC~~ SOLN: 5.0000 [IU] | Freq: Once | SUBCUTANEOUS | Status: AC

## 2019-09-06 MED ORDER — LIDOCAINE HCL (CARDIAC) PF 100 MG/5ML IV SOSY
PREFILLED_SYRINGE | INTRAVENOUS | Status: DC | PRN
  Administered 2019-09-06: 50 mg via INTRAVENOUS

## 2019-09-06 NOTE — Transfer of Care (Signed)
Immediate Anesthesia Transfer of Care Note  Patient: Dwayne Garrett  Procedure(s) Performed: ESOPHAGOGASTRODUODENOSCOPY (EGD) WITH PROPOFOL (N/A )  Patient Location: PACU  Anesthesia Type:General  Level of Consciousness: awake and drowsy  Airway & Oxygen Therapy: Patient Spontanous Breathing  Post-op Assessment: Report given to RN and Post -op Vital signs reviewed and stable  Post vital signs: Reviewed and stable  Last Vitals:  Vitals Value Taken Time  BP 108/57 09/06/19 0859  Temp    Pulse 76 09/06/19 0859  Resp 18 09/06/19 0859  SpO2 94 % 09/06/19 0859  Vitals shown include unvalidated device data.  Last Pain:  Vitals:   09/06/19 0850  TempSrc: Temporal  PainSc:          Complications: No apparent anesthesia complications

## 2019-09-06 NOTE — Op Note (Signed)
[facility] Gastroenterology Patient Name: Dwayne Garrett Procedure Date: 09/06/2019 8:43 AM MRN: [id number] Account #: [id number] Date of Birth: May 15, 1956 Admit Type: Outpatient Age: 64 Room: [facility] ENDO ROOM 1 Gender: Male Note Status: Finalized Procedure:             Upper GI endoscopy Indications:           Cirrhosis rule out esophageal varices Providers:             Jonathon Bellows MD, MD Referring MD:          [facility] (Referring MD) Medicines:             Monitored Anesthesia Care Complications:         No immediate complications. Procedure:             Pre-Anesthesia Assessment:                        - Prior to the procedure, a History and Physical was                         performed, and patient medications, allergies and                         sensitivities were reviewed. The patient's tolerance                         of previous anesthesia was reviewed.                        - The risks and benefits of the procedure and the                         sedation options and risks were discussed with the                         patient. All questions were answered and informed                         consent was obtained.                        - ASA Grade Assessment: III - A patient with severe                         systemic disease.                        After obtaining informed consent, the endoscope was                         passed under direct vision. Throughout the procedure,                         the patient's blood pressure, pulse, and oxygen                         saturations were monitored continuously. The Endoscope                         was introduced through the  mouth, and advanced to the                         third part of duodenum. The upper GI endoscopy was                         accomplished with ease. The patient tolerated the                         procedure well. Findings:      The examined duodenum was  normal.      The stomach was normal.      The cardia and gastric fundus were normal on retroflexion.      One tongue of salmon-colored mucosa was present. No other visible       abnormalities were present. The maximum longitudinal extent of these       esophageal mucosal changes was 0.7 cm in length. Biopsies were taken       with a cold forceps for histology. Impression:            - Normal examined duodenum.                        - Normal stomach.                        - Salmon-colored mucosa suspicious for Barrett's                         esophagus. Biopsied. Recommendation:        - Await pathology results.                        - Discharge patient to home (with escort).                        - Resume previous diet.                        - Continue present medications.                        - Repeat upper endoscopy in 3 years for surveillance.                        - Return to GI office as previously scheduled. Procedure Code(s):     --- Professional ---                        [phone], Esophagogastroduodenoscopy, flexible,                         transoral; with biopsy, single or multiple Diagnosis Code(s):     --- Professional ---                        K22.8, Other specified diseases of esophagus                        K74.60, Unspecified cirrhosis of liver CPT copyright 2019 American Medical Association. All rights reserved. The codes documented in this report are preliminary and upon coder review may  be  revised to meet current compliance requirements. Jonathon Bellows, MD Jonathon Bellows MD, MD 09/06/2019 8:54:33 AM This report has been signed electronically. Number of Addenda: 0 Note Initiated On: 09/06/2019 8:43 AM Estimated Blood Loss:  Estimated blood loss: none.      [facility]

## 2019-09-06 NOTE — Anesthesia Postprocedure Evaluation (Signed)
Anesthesia Post Note  Patient: EMILO GRAS  Procedure(s) Performed: ESOPHAGOGASTRODUODENOSCOPY (EGD) WITH PROPOFOL (N/A )  Patient location during evaluation: Endoscopy Anesthesia Type: General Level of consciousness: awake and alert Pain management: pain level controlled Vital Signs Assessment: post-procedure vital signs reviewed and stable Respiratory status: spontaneous breathing, nonlabored ventilation, respiratory function stable and patient connected to nasal cannula oxygen Cardiovascular status: blood pressure returned to baseline and stable Postop Assessment: no apparent nausea or vomiting Anesthetic complications: no     Last Vitals:  Vitals:   09/06/19 0900 09/06/19 0910  BP: (!) 108/57 138/73  Pulse: 76 73  Resp: 18 17  Temp:    SpO2: 94% 92%    Last Pain:  Vitals:   09/06/19 0850  TempSrc: Temporal  PainSc:                  Precious Haws Terena Bohan

## 2019-09-06 NOTE — H&P (Signed)
Jonathon Bellows, MD [address], [place], [place], [place], [address] [address], [place], [place], [place], [address] Phone: [phone]  Fax: [phone]  Primary Care Physician:  [facility]   Pre-Procedure History & Physical: HPI:  Dwayne Garrett is a 63 y.o. male is here for an endoscopy    Past Medical History:  Diagnosis Date  . Abnormal LFTs    Cirrhosis per liver biopsy - follows with Dr. Laural Golden  . Bilateral leg edema    Chronic  . 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 Coronary atherosclerosis of native coronary artery    Multivessel (moderate), DES RCA 4/11, LVEF 60-65%. LHC 03/11/17: DES to dist RCA & 3rd left ventricular branch  . Essential hypertension, benign   . 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 OSA (obstructive sleep apnea)    on CPAP    Past Surgical History:  Procedure Laterality Date  . BONE CYST EXCISION     Cyst removal from neck  . CARDIAC CATHETERIZATION N/A 12/11/2014   Procedure: Left Heart Cath and Coronary Angiography;  Surgeon: Burnell Blanks, MD;  Location: [place] [facility];  Service: Cardiovascular;  Laterality: N/A;  . CORONARY STENT INTERVENTION N/A 03/11/2017   Procedure: CORONARY STENT INTERVENTION;  Surgeon: Belva Crome, MD;  Location: [place] [facility];  Service: Cardiovascular;  Laterality: N/A;  . LEFT HEART CATH AND CORONARY ANGIOGRAPHY N/A 03/11/2017   Procedure: LEFT HEART CATH AND CORONARY ANGIOGRAPHY;  Surgeon: Belva Crome, MD;  Location: [place] [facility];  Service: Cardiovascular;  Laterality: N/A;  . SKIN GRAFT     Finger  . TUMOR REMOVAL     Abdominal  . ULTRASOUND GUIDANCE FOR VASCULAR ACCESS  03/11/2017   Procedure: Ultrasound Guidance For Vascular Access;  Surgeon: Belva Crome, MD;  Location: [place] [facility];  Service: Cardiovascular;;    Prior to Admission medications   Medication Sig Start Date End Date Taking? Authorizing Provider  amLODipine (NORVASC) 5 MG tablet  Take 1 tablet (5 mg total) by mouth 2 (two) times daily. 04/07/17  Yes Satira Sark, MD  aspirin 81 MG chewable tablet Chew 1 tablet (81 mg total) by mouth daily. 03/12/17  Yes Daune Perch, NP  atorvastatin (LIPITOR) 40 MG tablet Take 1 tablet (40 mg total) by mouth daily. 06/26/18 09/06/19 Yes Satira Sark, MD  lidocaine (LIDODERM) 5 % Place 1 patch onto the skin daily. Remove & Discard patch within 12 hours or as directed by MD 07/19/19  Yes Henderly, Britni A, PA-C  metFORMIN (GLUCOPHAGE) 500 MG tablet Take 1,000 mg by mouth every morning.  11/27/14  Yes [provider]  Multiple Vitamin (MULTIVITAMIN WITH MINERALS) TABS tablet Take 1 tablet by mouth daily.   Yes [provider]  NITROSTAT 0.4 MG SL tablet PLACE 1 TABLET UNDER TONGUE EVERY FIVE MINUTES AS DIRECTED 12/16/15  Yes Satira Sark, MD  Omega-3 Fatty Acids (FISH OIL) 1000 MG CAPS Take 3,000 mg by mouth daily.   Yes [provider]  omeprazole (PRILOSEC) 40 MG capsule TAKE 1 CAPSULE (40 MG TOTAL) BY MOUTH DAILY. 01/24/17  Yes Setzer, Rona Ravens, NP  ONETOUCH VERIO test strip TEST DAILY E11.9 10/24/14  Yes [provider]  oxyCODONE (ROXICODONE) 5 MG immediate release tablet Take 1 tablet (5 mg total) by mouth every 4 (four) hours as needed for severe pain. 07/19/19  Yes Henderly, Britni A, PA-C  sildenafil (VIAGRA)  100 MG tablet Take 100 mg by mouth daily as needed for erectile dysfunction.   Yes [provider]  losartan (COZAAR) 100 MG tablet Take 100 mg by mouth daily.    [provider]  ticagrelor (BRILINTA) 60 MG TABS tablet Take 1 tablet (60 mg total) by mouth 2 (two) times daily. Patient not taking: Reported on 07/18/2019 10/17/18   Satira Sark, MD    Allergies as of 08/16/2019 - Review Complete 07/19/2019  Allergen Reaction Noted  . Nyquil multi-symptom [pseudoeph-doxylamine-dm-apap]  07/19/2019  . Tylenol [acetaminophen]  07/19/2019    Family History   Problem Relation Age of Onset  . Heart attack Brother 28    Social History   Socioeconomic History  . Marital status: Married    Spouse name: Not on file  . Number of children: Not on file  . Years of education: Not on file  . Highest education level: Not on file  Occupational History  . Occupation: Information systems manager: [employer]  Tobacco Use  . Smoking status: Current Every Day Smoker    Packs/day: 0.25    Types: Cigarettes    Last attempt to quit: 07/01/2004    Years since quitting: 15.1  . Smokeless tobacco: Current User    Types: Chew  . 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 Alcohol use: No  . Drug use: No  . Sexual activity: Not on file  Other Topics Concern  . Not on file  Social History Narrative  . Not on file   Social Determinants of Health   Financial Resource Strain:   . Difficulty of Paying Living Expenses:   Food Insecurity:   . Worried About Charity fundraiser in the Last Year:   . Arboriculturist in the Last Year:   Transportation Needs:   . Film/video editor (Medical):   Marland Kitchen Lack of Transportation (Non-Medical):   Physical Activity:   . Days of Exercise per Week:   . Minutes of Exercise per Session:   Stress:   . Feeling of Stress :   Social Connections:   . Frequency of Communication with Friends and Family:   . Frequency of Social Gatherings with Friends and Family:   . Attends Religious Services:   . Active Member of Clubs or Organizations:   . Attends Archivist Meetings:   Marland Kitchen Marital Status:   Intimate Partner Violence:   . Fear of Current or Ex-Partner:   . Emotionally Abused:   Marland Kitchen Physically Abused:   . Sexually Abused:     Review of Systems: See HPI, otherwise negative ROS  Physical Exam: BP (!) 165/104   Pulse 83   Temp 98 F (36.7 C) (Temporal)   Resp 16   Ht 5' 9"  (1.753 m)   Wt 93 kg   SpO2 97%   BMI 30.27 kg/m  General:   Alert,  pleasant and  cooperative in NAD Head:  Normocephalic and atraumatic. Neck:  Supple; no masses or thyromegaly. Lungs:  Clear throughout to auscultation, normal respiratory effort.    Heart:  +S1, +S2, Regular rate and rhythm, No edema. Abdomen:  Soft, nontender and nondistended. Normal bowel sounds, without guarding, and without rebound.   Neurologic:  Alert and  oriented x4;  grossly normal neurologically.  Impression/Plan: Dwayne Garrett is here for an endoscopy  to be performed for  Screening for esophageal; varices  Risks, benefits, limitations, and alternatives regarding endoscopy have been reviewed with the patient.  Questions have been answered.  All parties agreeable.   Jonathon Bellows, MD  09/06/2019, 8:43 AM

## 2019-09-06 NOTE — Anesthesia Preprocedure Evaluation (Signed)
Anesthesia Evaluation  Patient identified by MRN, date of birth, ID band Patient awake    Reviewed: Allergy & Precautions, H&P , NPO status , Patient's Chart, lab work & pertinent test results  History of Anesthesia Complications Negative for: history of anesthetic complications  Airway Mallampati: III  TM Distance: >3 FB Neck ROM: limited    Dental  (+) Chipped, Poor Dentition, Missing, Partial Lower   Pulmonary neg shortness of breath, sleep apnea , COPD, Current Smoker and Patient abstained from smoking.,           Cardiovascular Exercise Tolerance: Good hypertension, (-) angina+ CAD and + Past MI  (-) DOE      Neuro/Psych negative neurological ROS  negative psych ROS   GI/Hepatic GERD  Medicated and Controlled,(+) Cirrhosis       ,   Endo/Other  diabetes, Type 2  Renal/GU negative Renal ROS  negative genitourinary   Musculoskeletal   Abdominal   Peds  Hematology negative hematology ROS (+)   Anesthesia Other Findings Past Medical History: No date: Abnormal LFTs     Comment:  Cirrhosis per liver biopsy - follows with Dr. 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 LHC               03/11/17: DES to dist RCA & 3rd left ventricular branch No date: Essential hypertension, benign No date: Inferior myocardial infarction [facility])     Comment:  09/2009 No date: OSA (obstructive sleep apnea)     Comment:  on CPAP  Past Surgical History: No date: BONE CYST EXCISION     Comment:  Cyst removal from neck 12/11/2014: CARDIAC CATHETERIZATION; N/A     Comment:  Procedure: Left Heart Cath and Coronary Angiography;                Surgeon: Burnell Blanks, MD;  Location: [place] [facility];  Service: Cardiovascular;  Laterality:                N/A; 03/11/2017: CORONARY STENT INTERVENTION; N/A     Comment:  Procedure: CORONARY STENT INTERVENTION;  Surgeon: Belva Crome, MD;  Location: [place] [facility];  Service:               Cardiovascular;  Laterality: N/A; 03/11/2017: LEFT HEART CATH AND CORONARY ANGIOGRAPHY; N/A     Comment:  Procedure: LEFT HEART CATH AND CORONARY ANGIOGRAPHY;                Surgeon: Belva Crome, MD;  Location: [place] [facility]               LAB;  Service: Cardiovascular;  Laterality: N/A; No date: SKIN GRAFT     Comment:  Finger No date: TUMOR REMOVAL     Comment:  Abdominal 03/11/2017: ULTRASOUND GUIDANCE FOR VASCULAR ACCESS     Comment:  Procedure: Ultrasound Guidance For Vascular Access;                Surgeon: Belva Crome, MD;  Location: [place] [facility]               LAB;  Service: Cardiovascular;;  BMI    Body Mass Index: 30.27 kg/m      Reproductive/Obstetrics negative OB ROS                             Anesthesia Physical Anesthesia Plan  ASA: III  Anesthesia Plan: General   Post-op Pain Management:    Induction: Intravenous  PONV Risk Score and Plan: Propofol infusion and TIVA  Airway Management Planned: Natural Airway and Nasal Cannula  Additional Equipment:   Intra-op Plan:   Post-operative Plan:   Informed Consent: I have reviewed the patients History and Physical, chart, labs and discussed the procedure including the risks, benefits and alternatives for the proposed anesthesia with the patient or authorized representative who has indicated his/her understanding and acceptance.     Dental Advisory Given  Plan Discussed with: Anesthesiologist, CRNA and Surgeon  Anesthesia Plan Comments: (Patient consented for risks of anesthesia including but not limited to:  - adverse reactions to medications - risk of intubation if required - damage to teeth, lips or other oral mucosa - sore throat or hoarseness - Damage to  heart, brain, lungs or loss of life  Patient voiced understanding.)        Anesthesia Quick Evaluation

## 2019-09-06 NOTE — Anesthesia Procedure Notes (Signed)
Date/Time: 09/06/2019 8:45 AM Performed by: Johnna Acosta, CRNA Pre-anesthesia Checklist: Patient identified, Emergency Drugs available, Suction available, Patient being monitored and Timeout performed Patient Re-evaluated:Patient Re-evaluated prior to induction Oxygen Delivery Method: Nasal cannula Preoxygenation: Pre-oxygenation with 100% oxygen Induction Type: IV induction

## 2019-09-07 ENCOUNTER — Encounter: Payer: Self-pay | Admitting: *Deleted

## 2019-09-07 LAB — SURGICAL PATHOLOGY

## 2019-09-10 ENCOUNTER — Encounter: Payer: Self-pay | Admitting: Gastroenterology

## 2019-11-16 ENCOUNTER — Other Ambulatory Visit: Payer: Self-pay

## 2019-11-16 DIAGNOSIS — K7581 Nonalcoholic steatohepatitis (NASH): Secondary | ICD-10-CM

## 2019-12-03 ENCOUNTER — Ambulatory Visit: Payer: No Typology Code available for payment source | Admitting: Gastroenterology

## 2020-05-20 DIAGNOSIS — Z6831 Body mass index (BMI) 31.0-31.9, adult: Secondary | ICD-10-CM | POA: Insufficient documentation

## 2020-10-14 ENCOUNTER — Other Ambulatory Visit: Payer: Self-pay

## 2020-10-14 ENCOUNTER — Inpatient Hospital Stay (HOSPITAL_COMMUNITY)
Admission: EM | Admit: 2020-10-14 | Discharge: 2020-10-24 | DRG: 234 | Disposition: A | Discharge status: Home / Self Care | Payer: No Typology Code available for payment source | Attending: Thoracic Surgery (Cardiothoracic Vascular Surgery) | Admitting: Thoracic Surgery (Cardiothoracic Vascular Surgery)

## 2020-10-14 DIAGNOSIS — Z716 Tobacco abuse counseling: Secondary | ICD-10-CM

## 2020-10-14 DIAGNOSIS — Z20822 Contact with and (suspected) exposure to covid-19: Secondary | ICD-10-CM | POA: Diagnosis present

## 2020-10-14 DIAGNOSIS — Z7902 Long term (current) use of antithrombotics/antiplatelets: Secondary | ICD-10-CM

## 2020-10-14 DIAGNOSIS — Z7982 Long term (current) use of aspirin: Secondary | ICD-10-CM

## 2020-10-14 DIAGNOSIS — I214 Non-ST elevation (NSTEMI) myocardial infarction: Secondary | ICD-10-CM | POA: Diagnosis not present

## 2020-10-14 DIAGNOSIS — E1165 Type 2 diabetes mellitus with hyperglycemia: Secondary | ICD-10-CM | POA: Diagnosis present

## 2020-10-14 DIAGNOSIS — K219 Gastro-esophageal reflux disease without esophagitis: Secondary | ICD-10-CM | POA: Diagnosis present

## 2020-10-14 DIAGNOSIS — F17210 Nicotine dependence, cigarettes, uncomplicated: Secondary | ICD-10-CM | POA: Diagnosis present

## 2020-10-14 DIAGNOSIS — I252 Old myocardial infarction: Secondary | ICD-10-CM

## 2020-10-14 DIAGNOSIS — E118 Type 2 diabetes mellitus with unspecified complications: Secondary | ICD-10-CM | POA: Diagnosis present

## 2020-10-14 DIAGNOSIS — J90 Pleural effusion, not elsewhere classified: Secondary | ICD-10-CM

## 2020-10-14 DIAGNOSIS — D62 Acute posthemorrhagic anemia: Secondary | ICD-10-CM | POA: Diagnosis not present

## 2020-10-14 DIAGNOSIS — G4733 Obstructive sleep apnea (adult) (pediatric): Secondary | ICD-10-CM | POA: Diagnosis present

## 2020-10-14 DIAGNOSIS — K921 Melena: Secondary | ICD-10-CM | POA: Diagnosis present

## 2020-10-14 DIAGNOSIS — Z7984 Long term (current) use of oral hypoglycemic drugs: Secondary | ICD-10-CM

## 2020-10-14 DIAGNOSIS — I10 Essential (primary) hypertension: Secondary | ICD-10-CM | POA: Diagnosis present

## 2020-10-14 DIAGNOSIS — E8770 Fluid overload, unspecified: Secondary | ICD-10-CM | POA: Diagnosis not present

## 2020-10-14 DIAGNOSIS — I200 Unstable angina: Secondary | ICD-10-CM | POA: Diagnosis present

## 2020-10-14 DIAGNOSIS — I25110 Atherosclerotic heart disease of native coronary artery with unstable angina pectoris: Secondary | ICD-10-CM | POA: Diagnosis present

## 2020-10-14 DIAGNOSIS — Z888 Allergy status to other drugs, medicaments and biological substances status: Secondary | ICD-10-CM

## 2020-10-14 DIAGNOSIS — D47Z2 Castleman disease: Secondary | ICD-10-CM | POA: Diagnosis present

## 2020-10-14 DIAGNOSIS — Z886 Allergy status to analgesic agent status: Secondary | ICD-10-CM

## 2020-10-14 DIAGNOSIS — Z951 Presence of aortocoronary bypass graft: Secondary | ICD-10-CM

## 2020-10-14 DIAGNOSIS — K7460 Unspecified cirrhosis of liver: Secondary | ICD-10-CM

## 2020-10-14 DIAGNOSIS — I4891 Unspecified atrial fibrillation: Secondary | ICD-10-CM | POA: Diagnosis not present

## 2020-10-14 DIAGNOSIS — E782 Mixed hyperlipidemia: Secondary | ICD-10-CM | POA: Diagnosis present

## 2020-10-14 DIAGNOSIS — I2510 Atherosclerotic heart disease of native coronary artery without angina pectoris: Secondary | ICD-10-CM | POA: Diagnosis present

## 2020-10-14 DIAGNOSIS — J939 Pneumothorax, unspecified: Secondary | ICD-10-CM

## 2020-10-14 DIAGNOSIS — Z8249 Family history of ischemic heart disease and other diseases of the circulatory system: Secondary | ICD-10-CM

## 2020-10-14 DIAGNOSIS — Z79899 Other long term (current) drug therapy: Secondary | ICD-10-CM

## 2020-10-14 DIAGNOSIS — Z955 Presence of coronary angioplasty implant and graft: Secondary | ICD-10-CM

## 2020-10-15 ENCOUNTER — Encounter (HOSPITAL_COMMUNITY): Payer: Self-pay | Admitting: Emergency Medicine

## 2020-10-15 ENCOUNTER — Emergency Department (HOSPITAL_COMMUNITY): Payer: No Typology Code available for payment source

## 2020-10-15 ENCOUNTER — Observation Stay (HOSPITAL_BASED_OUTPATIENT_CLINIC_OR_DEPARTMENT_OTHER): Payer: No Typology Code available for payment source

## 2020-10-15 ENCOUNTER — Other Ambulatory Visit: Payer: Self-pay

## 2020-10-15 ENCOUNTER — Encounter (HOSPITAL_COMMUNITY)
Admission: EM | Disposition: A | Discharge status: Home / Self Care | Payer: Self-pay | Source: Home / Self Care | Attending: Cardiovascular Disease

## 2020-10-15 DIAGNOSIS — Z7982 Long term (current) use of aspirin: Secondary | ICD-10-CM | POA: Diagnosis not present

## 2020-10-15 DIAGNOSIS — R079 Chest pain, unspecified: Secondary | ICD-10-CM | POA: Diagnosis not present

## 2020-10-15 DIAGNOSIS — Z886 Allergy status to analgesic agent status: Secondary | ICD-10-CM | POA: Diagnosis not present

## 2020-10-15 DIAGNOSIS — I2510 Atherosclerotic heart disease of native coronary artery without angina pectoris: Secondary | ICD-10-CM | POA: Diagnosis not present

## 2020-10-15 DIAGNOSIS — E785 Hyperlipidemia, unspecified: Secondary | ICD-10-CM | POA: Diagnosis not present

## 2020-10-15 DIAGNOSIS — K7460 Unspecified cirrhosis of liver: Secondary | ICD-10-CM

## 2020-10-15 DIAGNOSIS — D47Z2 Castleman disease: Secondary | ICD-10-CM | POA: Diagnosis not present

## 2020-10-15 DIAGNOSIS — E8770 Fluid overload, unspecified: Secondary | ICD-10-CM | POA: Diagnosis not present

## 2020-10-15 DIAGNOSIS — I10 Essential (primary) hypertension: Secondary | ICD-10-CM | POA: Diagnosis not present

## 2020-10-15 DIAGNOSIS — I214 Non-ST elevation (NSTEMI) myocardial infarction: Principal | ICD-10-CM

## 2020-10-15 DIAGNOSIS — D62 Acute posthemorrhagic anemia: Secondary | ICD-10-CM | POA: Diagnosis not present

## 2020-10-15 DIAGNOSIS — I25110 Atherosclerotic heart disease of native coronary artery with unstable angina pectoris: Secondary | ICD-10-CM | POA: Diagnosis not present

## 2020-10-15 DIAGNOSIS — Z955 Presence of coronary angioplasty implant and graft: Secondary | ICD-10-CM | POA: Diagnosis not present

## 2020-10-15 DIAGNOSIS — Z720 Tobacco use: Secondary | ICD-10-CM

## 2020-10-15 DIAGNOSIS — E782 Mixed hyperlipidemia: Secondary | ICD-10-CM | POA: Diagnosis not present

## 2020-10-15 DIAGNOSIS — E119 Type 2 diabetes mellitus without complications: Secondary | ICD-10-CM | POA: Diagnosis not present

## 2020-10-15 DIAGNOSIS — Z888 Allergy status to other drugs, medicaments and biological substances status: Secondary | ICD-10-CM | POA: Diagnosis not present

## 2020-10-15 DIAGNOSIS — Z7902 Long term (current) use of antithrombotics/antiplatelets: Secondary | ICD-10-CM | POA: Diagnosis not present

## 2020-10-15 DIAGNOSIS — Z7984 Long term (current) use of oral hypoglycemic drugs: Secondary | ICD-10-CM | POA: Diagnosis not present

## 2020-10-15 DIAGNOSIS — Z20822 Contact with and (suspected) exposure to covid-19: Secondary | ICD-10-CM | POA: Diagnosis not present

## 2020-10-15 DIAGNOSIS — K921 Melena: Secondary | ICD-10-CM | POA: Diagnosis not present

## 2020-10-15 DIAGNOSIS — F17210 Nicotine dependence, cigarettes, uncomplicated: Secondary | ICD-10-CM | POA: Diagnosis not present

## 2020-10-15 DIAGNOSIS — K219 Gastro-esophageal reflux disease without esophagitis: Secondary | ICD-10-CM | POA: Diagnosis not present

## 2020-10-15 DIAGNOSIS — Z79899 Other long term (current) drug therapy: Secondary | ICD-10-CM | POA: Diagnosis not present

## 2020-10-15 DIAGNOSIS — I4891 Unspecified atrial fibrillation: Secondary | ICD-10-CM | POA: Diagnosis not present

## 2020-10-15 DIAGNOSIS — Z8249 Family history of ischemic heart disease and other diseases of the circulatory system: Secondary | ICD-10-CM | POA: Diagnosis not present

## 2020-10-15 DIAGNOSIS — G4733 Obstructive sleep apnea (adult) (pediatric): Secondary | ICD-10-CM | POA: Diagnosis not present

## 2020-10-15 DIAGNOSIS — E1165 Type 2 diabetes mellitus with hyperglycemia: Secondary | ICD-10-CM | POA: Diagnosis not present

## 2020-10-15 DIAGNOSIS — I252 Old myocardial infarction: Secondary | ICD-10-CM | POA: Diagnosis not present

## 2020-10-15 HISTORY — PX: LEFT HEART CATH AND CORONARY ANGIOGRAPHY: CATH118249

## 2020-10-15 LAB — RESP PANEL BY RT-PCR (FLU A&B, COVID) ARPGX2
Influenza A by PCR: NEGATIVE
Influenza B by PCR: NEGATIVE
SARS Coronavirus 2 by RT PCR: NEGATIVE

## 2020-10-15 LAB — BASIC METABOLIC PANEL
Anion gap: 9 (ref 5–15)
Anion gap: 11 (ref 5–15)
BUN: 17 mg/dL (ref 8–23)
BUN: 16 mg/dL (ref 8–23)
CO2: 24 mmol/L (ref 22–32)
CO2: 23 mmol/L (ref 22–32)
Calcium: 9.7 mg/dL (ref 8.9–10.3)
Calcium: 9.5 mg/dL (ref 8.9–10.3)
Chloride: 98 mmol/L (ref 98–111)
Chloride: 98 mmol/L (ref 98–111)
Creatinine, Ser: 0.65 mg/dL (ref 0.61–1.24)
Creatinine, Ser: 0.6 mg/dL — ABNORMAL LOW (ref 0.61–1.24)
GFR, Estimated: >60 mL/min (ref >60)
GFR, Estimated: >60 mL/min (ref >60)
Glucose, Bld: 490 mg/dL — ABNORMAL HIGH (ref 70–99)
Glucose, Bld: 433 mg/dL — ABNORMAL HIGH (ref 70–99)
Potassium: 4 mmol/L (ref 3.5–5.1)
Potassium: 3.9 mmol/L (ref 3.5–5.1)
Sodium: 131 mmol/L — ABNORMAL LOW (ref 135–145)
Sodium: 132 mmol/L — ABNORMAL LOW (ref 135–145)

## 2020-10-15 LAB — ECHOCARDIOGRAM COMPLETE
AR max vel: 3.04 cm2
AV Area VTI: 2.92 cm2
AV Area mean vel: 3.21 cm2
AV Mean grad: 4 mmHg
AV Peak grad: 7.1 mmHg
Ao pk vel: 1.34 m/s
Area-P 1/2: 3.6 cm2
Calc EF: 61.1 %
Height: 69 in
MV M vel: 4.66 m/s
MV Peak grad: 86.9 mmHg
S' Lateral: 2.6 cm
Single Plane A2C EF: 44.3 %
Single Plane A4C EF: 70.4 %
Weight: 3360 oz

## 2020-10-15 LAB — HEPATIC FUNCTION PANEL
ALT: 109 U/L — ABNORMAL HIGH (ref 0–44)
AST: 98 U/L — ABNORMAL HIGH (ref 15–41)
Albumin: 3.4 g/dL — ABNORMAL LOW (ref 3.5–5.0)
Alkaline Phosphatase: 60 U/L (ref 38–126)
Bilirubin, Direct: 0.1 mg/dL (ref 0.0–0.2)
Indirect Bilirubin: 1.3 mg/dL — ABNORMAL HIGH (ref 0.3–0.9)
Total Bilirubin: 1.4 mg/dL — ABNORMAL HIGH (ref 0.3–1.2)
Total Protein: 6.7 g/dL (ref 6.5–8.1)

## 2020-10-15 LAB — CBG MONITORING, ED
Glucose-Capillary: 411 mg/dL — ABNORMAL HIGH (ref 70–99)
Glucose-Capillary: 239 mg/dL — ABNORMAL HIGH (ref 70–99)
Glucose-Capillary: 205 mg/dL — ABNORMAL HIGH (ref 70–99)

## 2020-10-15 LAB — GLUCOSE, CAPILLARY
Glucose-Capillary: 187 mg/dL — ABNORMAL HIGH (ref 70–99)
Glucose-Capillary: 179 mg/dL — ABNORMAL HIGH (ref 70–99)
Glucose-Capillary: 209 mg/dL — ABNORMAL HIGH (ref 70–99)
Glucose-Capillary: 271 mg/dL — ABNORMAL HIGH (ref 70–99)

## 2020-10-15 LAB — LIPID PANEL
Cholesterol: 266 mg/dL — ABNORMAL HIGH (ref 0–200)
HDL: 32 mg/dL — ABNORMAL LOW (ref >40)
LDL Cholesterol: 202 mg/dL — ABNORMAL HIGH (ref 0–99)
Total CHOL/HDL Ratio: 8.3 RATIO
Triglycerides: 161 mg/dL — ABNORMAL HIGH (ref <150)
VLDL: 32 mg/dL (ref 0–40)

## 2020-10-15 LAB — CBC
HCT: 45.9 % (ref 39.0–52.0)
Hemoglobin: 15.2 g/dL (ref 13.0–17.0)
MCH: 30.3 pg (ref 26.0–34.0)
MCHC: 33.1 g/dL (ref 30.0–36.0)
MCV: 91.4 fL (ref 80.0–100.0)
Platelets: 202 10*3/uL (ref 150–400)
RBC: 5.02 MIL/uL (ref 4.22–5.81)
RDW: 12.9 % (ref 11.5–15.5)
WBC: 7.5 10*3/uL (ref 4.0–10.5)
nRBC: 0 % (ref 0.0–0.2)

## 2020-10-15 LAB — HEMOGLOBIN A1C
Hgb A1c MFr Bld: 12.4 % — ABNORMAL HIGH (ref 4.8–5.6)
Mean Plasma Glucose: 309.18 mg/dL

## 2020-10-15 LAB — TROPONIN I (HIGH SENSITIVITY)
Troponin I (High Sensitivity): 43 ng/L — ABNORMAL HIGH (ref <18)
Troponin I (High Sensitivity): 63 ng/L — ABNORMAL HIGH (ref <18)

## 2020-10-15 LAB — HEPARIN LEVEL (UNFRACTIONATED): Heparin Unfractionated: 0.1 IU/mL — ABNORMAL LOW (ref 0.30–0.70)

## 2020-10-15 SURGERY — LEFT HEART CATH AND CORONARY ANGIOGRAPHY
Anesthesia: LOCAL

## 2020-10-15 MED ORDER — ROSUVASTATIN CALCIUM 20 MG PO TABS
20.0000 mg | ORAL_TABLET | Freq: Every day | ORAL | Status: DC
  Administered 2020-10-15 – 2020-10-24 (×9): 20 mg via ORAL
  Filled 2020-10-15 (×9): qty 1

## 2020-10-15 MED ORDER — LOSARTAN POTASSIUM 25 MG PO TABS: 25.0000 mg | ORAL_TABLET | Freq: Every day | ORAL | Status: DC

## 2020-10-15 MED ORDER — HYDROCHLOROTHIAZIDE 25 MG PO TABS: 25.0000 mg | ORAL_TABLET | Freq: Every day | ORAL | Status: DC

## 2020-10-15 MED ORDER — INSULIN ASPART 100 UNIT/ML ~~LOC~~ SOLN
0.0000 [IU] | Freq: Three times a day (TID) | SUBCUTANEOUS | Status: DC
  Administered 2020-10-16: 15 [IU] via SUBCUTANEOUS
  Administered 2020-10-16: 3 [IU] via SUBCUTANEOUS
  Administered 2020-10-16: 15 [IU] via SUBCUTANEOUS

## 2020-10-15 MED ORDER — HEPARIN (PORCINE) 25000 UT/250ML-% IV SOLN
1200.0000 [IU]/h | INTRAVENOUS | Status: DC
  Administered 2020-10-15: 1200 [IU]/h via INTRAVENOUS
  Filled 2020-10-15: qty 250

## 2020-10-15 MED ORDER — SODIUM CHLORIDE 0.9% FLUSH
3.0000 mL | Freq: Two times a day (BID) | INTRAVENOUS | Status: DC
  Administered 2020-10-15: 3 mL via INTRAVENOUS

## 2020-10-15 MED ORDER — HEPARIN (PORCINE) IN NACL 1000-0.9 UT/500ML-% IV SOLN
INTRAVENOUS | Status: AC
  Filled 2020-10-15: qty 1000

## 2020-10-15 MED ORDER — LIDOCAINE HCL (PF) 1 % IJ SOLN
INTRAMUSCULAR | Status: AC
  Filled 2020-10-15: qty 30

## 2020-10-15 MED ORDER — FENTANYL CITRATE (PF) 100 MCG/2ML IJ SOLN
INTRAMUSCULAR | Status: DC | PRN
  Administered 2020-10-15: 50 ug via INTRAVENOUS

## 2020-10-15 MED ORDER — ASPIRIN 81 MG PO CHEW
81.0000 mg | CHEWABLE_TABLET | Freq: Every day | ORAL | Status: DC
  Administered 2020-10-15 – 2020-10-19 (×5): 81 mg via ORAL
  Filled 2020-10-15 (×5): qty 1

## 2020-10-15 MED ORDER — VERAPAMIL HCL 2.5 MG/ML IV SOLN
INTRAVENOUS | Status: DC | PRN
  Administered 2020-10-15: 10 mL via INTRA_ARTERIAL

## 2020-10-15 MED ORDER — IOHEXOL 350 MG/ML SOLN
INTRAVENOUS | Status: DC | PRN
  Administered 2020-10-15: 45 mL

## 2020-10-15 MED ORDER — LIVING WELL WITH DIABETES BOOK
Freq: Once | Status: AC
  Filled 2020-10-15: qty 1

## 2020-10-15 MED ORDER — NITROGLYCERIN 0.4 MG SL SUBL: 0.4000 mg | SUBLINGUAL_TABLET | SUBLINGUAL | Status: DC | PRN

## 2020-10-15 MED ORDER — LACTULOSE 10 GM/15ML PO SOLN
10.0000 g | Freq: Two times a day (BID) | ORAL | Status: DC
  Administered 2020-10-15 – 2020-10-19 (×9): 10 g via ORAL
  Filled 2020-10-15 (×9): qty 15

## 2020-10-15 MED ORDER — FENTANYL CITRATE (PF) 100 MCG/2ML IJ SOLN
INTRAMUSCULAR | Status: AC
  Filled 2020-10-15: qty 2

## 2020-10-15 MED ORDER — INSULIN ASPART 100 UNIT/ML ~~LOC~~ SOLN
0.0000 [IU] | SUBCUTANEOUS | Status: DC
  Administered 2020-10-15: 15 [IU] via SUBCUTANEOUS
  Administered 2020-10-15: 3 [IU] via SUBCUTANEOUS
  Administered 2020-10-15: 5 [IU] via SUBCUTANEOUS
  Filled 2020-10-15: qty 1

## 2020-10-15 MED ORDER — INSULIN ASPART 100 UNIT/ML ~~LOC~~ SOLN: 0.0000 [IU] | Freq: Three times a day (TID) | SUBCUTANEOUS | Status: DC

## 2020-10-15 MED ORDER — HEPARIN SODIUM (PORCINE) 1000 UNIT/ML IJ SOLN
INTRAMUSCULAR | Status: DC | PRN
  Administered 2020-10-15: 5000 [IU] via INTRAVENOUS

## 2020-10-15 MED ORDER — ASPIRIN 81 MG PO CHEW
324.0000 mg | CHEWABLE_TABLET | Freq: Once | ORAL | Status: AC
  Administered 2020-10-15: 324 mg via ORAL
  Filled 2020-10-15: qty 4

## 2020-10-15 MED ORDER — SODIUM CHLORIDE 0.9% FLUSH: 3.0000 mL | INTRAVENOUS | Status: DC | PRN

## 2020-10-15 MED ORDER — MIDAZOLAM HCL 2 MG/2ML IJ SOLN
INTRAMUSCULAR | Status: DC | PRN
  Administered 2020-10-15: 1 mg via INTRAVENOUS

## 2020-10-15 MED ORDER — HEPARIN (PORCINE) IN NACL 1000-0.9 UT/500ML-% IV SOLN
INTRAVENOUS | Status: DC | PRN
Start: 2020-10-15 — End: 2020-10-15
  Administered 2020-10-15 (×2): 500 mL

## 2020-10-15 MED ORDER — LOSARTAN POTASSIUM 50 MG PO TABS: 100.0000 mg | ORAL_TABLET | Freq: Every day | ORAL | Status: DC

## 2020-10-15 MED ORDER — INSULIN ASPART 100 UNIT/ML ~~LOC~~ SOLN
0.0000 [IU] | SUBCUTANEOUS | Status: DC
  Administered 2020-10-15: 3 [IU] via SUBCUTANEOUS
  Administered 2020-10-15: 5 [IU] via SUBCUTANEOUS

## 2020-10-15 MED ORDER — GABAPENTIN 300 MG PO CAPS
300.0000 mg | ORAL_CAPSULE | Freq: Every day | ORAL | Status: DC
  Administered 2020-10-15 – 2020-10-24 (×9): 300 mg via ORAL
  Filled 2020-10-15 (×9): qty 1

## 2020-10-15 MED ORDER — NITROGLYCERIN 0.4 MG SL SUBL
0.4000 mg | SUBLINGUAL_TABLET | SUBLINGUAL | Status: DC | PRN
  Filled 2020-10-15: qty 1

## 2020-10-15 MED ORDER — SODIUM CHLORIDE 0.9 % WEIGHT BASED INFUSION: 1.0000 mL/kg/h | INTRAVENOUS | Status: DC

## 2020-10-15 MED ORDER — LOSARTAN POTASSIUM 50 MG PO TABS
100.0000 mg | ORAL_TABLET | Freq: Every day | ORAL | Status: DC
  Administered 2020-10-15 – 2020-10-19 (×5): 100 mg via ORAL
  Filled 2020-10-15 (×5): qty 2

## 2020-10-15 MED ORDER — INSULIN STARTER KIT- PEN NEEDLES (ENGLISH)
1.0000 | Freq: Once | Status: AC
  Administered 2020-10-15: 1
  Filled 2020-10-15: qty 1

## 2020-10-15 MED ORDER — LIDOCAINE HCL (PF) 1 % IJ SOLN
INTRAMUSCULAR | Status: DC | PRN
Start: 2020-10-15 — End: 2020-10-15
  Administered 2020-10-15: 2 mL via INTRADERMAL

## 2020-10-15 MED ORDER — LACTULOSE 10 GM/15ML PO SOLN: 10.0000 g | Freq: Two times a day (BID) | ORAL | Status: DC

## 2020-10-15 MED ORDER — NITROGLYCERIN IN D5W 200-5 MCG/ML-% IV SOLN
5.0000 ug/min | INTRAVENOUS | Status: DC
  Administered 2020-10-15: 5 ug/min via INTRAVENOUS
  Filled 2020-10-15: qty 250

## 2020-10-15 MED ORDER — SODIUM CHLORIDE 0.9 % WEIGHT BASED INFUSION
3.0000 mL/kg/h | INTRAVENOUS | Status: DC
  Administered 2020-10-15: 3 mL/kg/h via INTRAVENOUS

## 2020-10-15 MED ORDER — VERAPAMIL HCL 2.5 MG/ML IV SOLN
INTRAVENOUS | Status: AC
  Filled 2020-10-15: qty 2

## 2020-10-15 MED ORDER — HYDROCHLOROTHIAZIDE 25 MG PO TABS
25.0000 mg | ORAL_TABLET | Freq: Every day | ORAL | Status: DC
  Administered 2020-10-16: 25 mg via ORAL
  Filled 2020-10-15: qty 1

## 2020-10-15 MED ORDER — PANTOPRAZOLE SODIUM 40 MG PO TBEC
40.0000 mg | DELAYED_RELEASE_TABLET | Freq: Every day | ORAL | Status: DC
  Administered 2020-10-15 – 2020-10-19 (×5): 40 mg via ORAL
  Filled 2020-10-15 (×5): qty 1

## 2020-10-15 MED ORDER — CYCLOBENZAPRINE HCL 10 MG PO TABS: 10.0000 mg | ORAL_TABLET | Freq: Three times a day (TID) | ORAL | Status: DC | PRN

## 2020-10-15 MED ORDER — METOPROLOL TARTRATE 5 MG/5ML IV SOLN
5.0000 mg | Freq: Once | INTRAVENOUS | Status: AC
  Administered 2020-10-15: 5 mg via INTRAVENOUS
  Filled 2020-10-15: qty 5

## 2020-10-15 MED ORDER — ONDANSETRON HCL 4 MG/2ML IJ SOLN: 4.0000 mg | Freq: Four times a day (QID) | INTRAMUSCULAR | Status: DC | PRN

## 2020-10-15 MED ORDER — SODIUM CHLORIDE 0.9% FLUSH
3.0000 mL | Freq: Once | INTRAVENOUS | Status: AC
  Administered 2020-10-15: 3 mL via INTRAVENOUS

## 2020-10-15 MED ORDER — MIDAZOLAM HCL 2 MG/2ML IJ SOLN
INTRAMUSCULAR | Status: AC
  Filled 2020-10-15: qty 2

## 2020-10-15 MED ORDER — INSULIN ASPART 100 UNIT/ML ~~LOC~~ SOLN
0.0000 [IU] | Freq: Every day | SUBCUTANEOUS | Status: DC
  Administered 2020-10-15: 3 [IU] via SUBCUTANEOUS

## 2020-10-15 MED ORDER — SODIUM CHLORIDE 0.9% FLUSH
3.0000 mL | Freq: Two times a day (BID) | INTRAVENOUS | Status: DC
  Administered 2020-10-15 – 2020-10-19 (×7): 3 mL via INTRAVENOUS
  Administered 2020-10-19: 10 mL via INTRAVENOUS

## 2020-10-15 MED ORDER — HEPARIN (PORCINE) 25000 UT/250ML-% IV SOLN
1900.0000 [IU]/h | INTRAVENOUS | Status: DC
  Administered 2020-10-15: 1200 [IU]/h via INTRAVENOUS
  Administered 2020-10-16: 1450 [IU]/h via INTRAVENOUS
  Administered 2020-10-17 (×2): 1700 [IU]/h via INTRAVENOUS
  Administered 2020-10-18 – 2020-10-19 (×3): 1900 [IU]/h via INTRAVENOUS
  Filled 2020-10-15 (×7): qty 250

## 2020-10-15 MED ORDER — AMLODIPINE BESYLATE 5 MG PO TABS
5.0000 mg | ORAL_TABLET | Freq: Every day | ORAL | Status: DC
  Administered 2020-10-15 – 2020-10-16 (×2): 5 mg via ORAL
  Filled 2020-10-15 (×2): qty 1

## 2020-10-15 MED ORDER — HEPARIN BOLUS VIA INFUSION
4000.0000 [IU] | Freq: Once | INTRAVENOUS | Status: AC
  Administered 2020-10-15: 4000 [IU] via INTRAVENOUS

## 2020-10-15 MED ORDER — SODIUM CHLORIDE 0.9 % IV SOLN: 250.0000 mL | INTRAVENOUS | Status: DC | PRN

## 2020-10-15 MED ORDER — SODIUM CHLORIDE 0.9 % IV SOLN: INTRAVENOUS | Status: AC

## 2020-10-15 SURGICAL SUPPLY — 9 items
CATH INFINITI 5FR JK (CATHETERS) ×2 IMPLANT
DEVICE RAD COMP TR BAND LRG (VASCULAR PRODUCTS) ×2 IMPLANT
GLIDESHEATH SLEND SS 6F .021 (SHEATH) ×2 IMPLANT
GUIDEWIRE INQWIRE 1.5J.035X260 (WIRE) ×1 IMPLANT
INQWIRE 1.5J .035X260CM (WIRE) ×2
KIT HEART LEFT (KITS) ×2 IMPLANT
PACK CARDIAC CATHETERIZATION (CUSTOM PROCEDURE TRAY) ×2 IMPLANT
TRANSDUCER W/STOPCOCK (MISCELLANEOUS) ×2 IMPLANT
TUBING CIL FLEX 10 FLL-RA (TUBING) ×2 IMPLANT

## 2020-10-15 NOTE — H&P (Addendum)
Cardiology Admission History and Physical:   Patient ID: Dwayne Garrett MRN: [id number]; DOB: 1956/03/12   Admission date: 10/14/2020  PCP:  [facility], [place]  Cardiologist:  Rozann Lesches, MD  Electrophysiologist:  None    Chief Complaint: chest pain  Patient Profile:   Dwayne Garrett is a 65 y.o. male with a history of multivessel CAD s/p multiple PCIs, hypertension, hyperlipidemia, type 2 diabetes mellitus, obstructive sleep apnea (CPAP machine broken), cirrhosis, and Castleman's disease who was transferred from [facility] for further management of NSTEMI after presenting there with chest pain.  History of Present Illness:   [name] is a 65 year male with the above history. Previously followed by Dr. Domenic Polite but now follows with the [facility] [place]. He has known CAD with multiple PCI. Most recently had DES to distal RCA and 3rd left ventricular branch in 02/2017. At that time, he was also noted to have moderate disease in the trunk of the left main (unchanged from prior angiogram) as well as moderate disease in LAD which was treated medically. He was last seen by Dr. Domenic Polite in 06/2018.  Patient presented to [facility] ED early this morning shortly after midnight for evaluation of chest pain.  For about the past month, patient has had a couple episodes of chest heaviness with associated bilateral jaw pain.  He ranks the pain as a 6 out of 10 on the pain scale at its worst.  No associated shortness of breath, diaphoresis, nausea/vomiting with this.  He has had about 3 of these episodes over the last month and symptoms have resolved after 1 dose of sublingual Nitro.  Another similar episode last night around 10 PM.  He took a dose of sublingual Nito and the pain went away.  He went to sleep but then woke up shortly after 11 PM with recurrent chest pain.  First time this it happened so he took another Nitro and decided to come to the ED.  Symptoms  similar to prior cardiac symptoms but not as severe.  Interestingly, pain over the last month has always occurred at night while at rest.  No exertional chest pain.  He stays very active in his yard cutting down trees and mowing the lawn and denies any pain with this.  He does note some dyspnea with these activities which he states is new over the last couple months. No shortness of breath at rest.  No orthopnea or PND.  He has chronic lower extremity edema but states this is actually been better than usual recently.  He denies any recent fevers or illnesses.  He does note some recent blood in his stools after starting the medicine for his cirrhosis (sounds like he was started on lactulose for elevated ammonia levels but patient could not tell me for sure).  He he has not noticed any blood in stool since last week.  No other abnormal bleeding.  In the ED, patient hypertensive but vitals stable. EKG showed normal sinus rhythm, rate 72 bpm, with some mild ST/T changes in inferior leads (may be artifact). High-sensitivity troponin mildly elevated at 43 >> 63. Chest x-ray showed no acute findings. WBC 7.5, Hgb 15.2, Plts 202. Na 131, K 4.0, Glucose 490, BUN 17, Cr 0.65. Respiratory panel negative for COVID and influenza. He was started on IV Heparin and IV Nitro and was transferred to [facility] for further evaluation.  At the time of this evaluation, patient is chest pain-free.  He does state that he recently restarted smoking about 3 4 weeks ago and is currently smoking about 3/4 packs a day.  Past Medical History:  Diagnosis Date  . Abnormal LFTs    Cirrhosis per liver biopsy - follows with Dr. Laural Golden  . Bilateral leg edema    Chronic  . Castleman's disease ([place])   . Coronary atherosclerosis of native coronary artery    Multivessel (moderate), DES RCA 4/11, LVEF 60-65%. LHC 03/11/17: DES to dist RCA & 3rd left ventricular branch  . Essential hypertension, benign   . 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 OSA (obstructive sleep apnea)    on CPAP    Past Surgical History:  Procedure Laterality Date  . BONE CYST EXCISION     Cyst removal from neck  . CARDIAC CATHETERIZATION N/A 12/11/2014   Procedure: Left Heart Cath and Coronary Angiography;  Surgeon: Burnell Blanks, MD;  Location: [place] CV LAB;  Service: Cardiovascular;  Laterality: N/A;  . CORONARY STENT INTERVENTION N/A 03/11/2017   Procedure: CORONARY STENT INTERVENTION;  Surgeon: Belva Crome, MD;  Location: [place] CV LAB;  Service: Cardiovascular;  Laterality: N/A;  . ESOPHAGOGASTRODUODENOSCOPY (EGD) WITH PROPOFOL N/A 09/06/2019   Procedure: ESOPHAGOGASTRODUODENOSCOPY (EGD) WITH PROPOFOL;  Surgeon: Jonathon Bellows, MD;  Location: [facility] ENDOSCOPY;  Service: Gastroenterology;  Laterality: N/A;  . LEFT HEART CATH AND CORONARY ANGIOGRAPHY N/A 03/11/2017   Procedure: LEFT HEART CATH AND CORONARY ANGIOGRAPHY;  Surgeon: Belva Crome, MD;  Location: [place] CV LAB;  Service: Cardiovascular;  Laterality: N/A;  . SKIN GRAFT     Finger  . TUMOR REMOVAL     Abdominal  . ULTRASOUND GUIDANCE FOR VASCULAR ACCESS  03/11/2017   Procedure: Ultrasound Guidance For Vascular Access;  Surgeon: Belva Crome, MD;  Location: [place] CV LAB;  Service: Cardiovascular;;     Medications Prior to Admission: Prior to Admission medications   Medication Sig Start Date End Date Taking? Authorizing Provider  cyclobenzaprine (FLEXERIL) 10 MG tablet take 1 tablet by oral route 3 times every day as needed for muscle spasms 07/27/19  Yes [provider]  docusate sodium (COLACE) 100 MG capsule Take by mouth. 04/03/20  Yes [provider]  gabapentin (NEURONTIN) 300 MG capsule Take by mouth. 08/10/19  Yes [provider]  meloxicam (MOBIC) 7.5 MG tablet Take by mouth. 08/10/19  Yes [provider]  oxyCODONE (OXY IR/ROXICODONE) 5 MG immediate release tablet Take by mouth. 04/03/20  Yes [provider]  amLODipine (NORVASC) 5 MG tablet Take 1 tablet (5 mg total) by mouth 2 (two) times daily. 04/07/17   Satira Sark, MD  aspirin 81 MG chewable tablet Chew 1 tablet (81 mg total) by mouth daily. 03/12/17   Daune Perch, NP  atorvastatin (LIPITOR) 40 MG tablet Take 1 tablet (40 mg total) by mouth daily. 06/26/18 09/06/19  Satira Sark, MD  carvedilol (COREG) 12.5 MG tablet Take by mouth.    [provider]  hydrochlorothiazide (HYDRODIURIL) 25 MG tablet Take by mouth.    [provider]  lidocaine (LIDODERM) 5 % Place 1 patch onto the skin daily. Remove & Discard patch within 12 hours or as directed by MD 07/19/19   Henderly, Britni A, PA-C  losartan (COZAAR) 100 MG tablet Take 100 mg by mouth daily.    [provider]  metFORMIN (GLUCOPHAGE) 500 MG tablet Take 1,000 mg by mouth every morning.  11/27/14  [provider]  Multiple Vitamin (MULTIVITAMIN WITH MINERALS) TABS tablet Take 1 tablet by mouth daily.    [provider]  NITROSTAT 0.4 MG SL tablet PLACE 1 TABLET UNDER TONGUE EVERY FIVE MINUTES AS DIRECTED 12/16/15   Satira Sark, MD  Omega-3 Fatty Acids (FISH OIL) 1000 MG CAPS Take 3,000 mg by mouth daily.    [provider]  omeprazole (PRILOSEC) 40 MG capsule TAKE 1 CAPSULE (40 MG TOTAL) BY MOUTH DAILY. 01/24/17   Setzer, Rona Ravens, NP  ONETOUCH VERIO test strip TEST DAILY E11.9 10/24/14   [provider]  oxyCODONE (ROXICODONE) 5 MG immediate release tablet Take 1 tablet (5 mg total) by mouth every 4 (four) hours as needed for severe pain. 07/19/19   Henderly, Britni A, PA-C  sildenafil (VIAGRA) 100 MG tablet Take 100 mg by mouth daily as needed for erectile dysfunction.    [provider]  ticagrelor (BRILINTA) 60 MG TABS tablet Take 1 tablet (60 mg total) by mouth 2 (two) times daily. Patient not taking: Reported on 07/18/2019 10/17/18   Satira Sark, MD     Allergies:    Allergies   Allergen Reactions  . Nyquil Multi-Symptom [Pseudoeph-Doxylamine-Dm-Apap]     Feel jittery   . Tylenol [Acetaminophen]     Due to liver    Social History:   Social History   Socioeconomic History  . Marital status: Married    Spouse name: Not on file  . Number of children: Not on file  . Years of education: Not on file  . Highest education level: Not on file  Occupational History  . Occupation: Information systems manager: [employer]  Tobacco Use  . Smoking status: Current Some Day Smoker    Packs/day: 0.25    Types: Cigarettes    Last attempt to quit: 07/01/2004    Years since quitting: 16.3  . Smokeless tobacco: Current User    Types: Chew  . 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 Vaping Use: Former  Substance and Sexual Activity  . Alcohol use: No  . Drug use: No  . Sexual activity: Not on file  Other Topics Concern  . Not on file  Social History Narrative  . Not on file   Social Determinants of Health   Financial Resource Strain: Not on file  Food Insecurity: Not on file  Transportation Needs: Not on file  Physical Activity: Not on file  Stress: Not on file  Social Connections: Not on file  Intimate Partner Violence: Not on file    Family History:   The patient's family history includes Heart attack (age of onset: 19) in his brother.    ROS:  Please see the history of present illness.  All other ROS reviewed and negative.     Physical Exam/Data:   Vitals:   10/15/20 0515 10/15/20 0530 10/15/20 0545 10/15/20 0701  BP: 118/78 137/87 130/77 (!) 160/98  Pulse: 61 61 (!) 58 67  Resp: 18 16 19 19   Temp:    97.7 F (36.5 C)  TempSrc:    Oral  SpO2: 92% 97% 93% 100%  Weight:      Height:        Intake/Output Summary (Last 24 hours) at 10/15/2020 0904 Last data filed at 10/15/2020 0442 Gross per 24 hour  Intake 6.42 ml  Output --  Net 6.42 ml   Last 3 Weights 10/15/2020 09/06/2019 07/19/2019  Weight (  lbs) 210 lb 205 lb 214  lb  Weight (kg) 95.255 kg 92.987 kg 97.07 kg     Body mass index is 31.01 kg/m.  General: 65 y.o. male resting comfortably in no acute distress. HEENT: Normocephalic and atraumatic. Sclera clear.  Neck: Supple. No carotid bruits. No JVD. Heart: RRR. Distinct S1 and S2. No murmurs, gallops, or rubs. Radial pulses 2+ and equal bilaterally. Lungs: No increased work of breathing. Clear to ausculation bilaterally. No wheezes, rhonchi, or rales.  Abdomen: Soft, non-distended, and non-tender to palpation. Bowel sounds present.  MSK: Normal strength and tone for age. Extremities: Trace lower extremity edema bilaterally..    Skin: Warm and dry. Neuro: Alert and oriented x3. No focal deficits. Psych: Normal affect. Responds appropriately.  EKG:  The ECG that was done was personally reviewed and demonstrates normal sinus rhythm, rate 72 bpm, with some mild ST/T changes in inferior leads (may be artifact).  Telemetry: Personally reviewed and demonstrates sinus rhythm with rates in the 50's to 60's.  Relevant CV Studies:  Left Cardiac Catheterization 03/11/2017:  Unstable angina/non-ST elevation myocardial infarction presentation.  The previously stented proximal to mid RCA is widely patent  De novo 95-99% distal RCA before the origin of the PDA.  De novo 95% stenosis in the third left ventricular branch of the right coronary artery.  Moderate obstruction in the trunk of the left main, 40%. This is unchanged from prior angiogram.  Moderate eccentric 40-50% proximal to mid LAD. Diffuse 30-50% disease in the mid and distal LAD. The largest previously stented diagonal reveals no evidence of in-stent restenosis.  Normal left ventricular size and function with mildly elevated LVEDP consistent with diastolic heart failure, asymptomatic. EF 60%.  Successful stenting of the distal RCA and third left ventricular branch reducing stenoses to 0% and 0% with TIMI grade 3 flow. Onyx 3.5 x 18 and 2.25 x 15  DES deployed at 15 atm in distal RCA and third LV branch respectively. TIMI grade 3 flow is noted beyond age treatment area.  Recommendations:  This gentleman needs aggressive risk factor modification he has moderate to moderately severe left main and LAD disease although I do not believe they are critical and surgical therapy at this time would risk graft occlusion because native flow is not impaired.  Aspirin and Brilinta 12 months  Hopeful discharge in a.m.    Laboratory Data:  High Sensitivity Troponin:   Recent Labs  Lab 10/15/20 0005 10/15/20 0155  TROPONINIHS 43* 63*      Chemistry Recent Labs  Lab 10/15/20 0005 10/15/20 0155  NA 131* 132*  K 4.0 3.9  CL 98 98  CO2 24 23  GLUCOSE 490* 433*  BUN 17 16  CREATININE 0.65 0.60*  CALCIUM 9.7 9.5  GFRNONAA >60 >60  ANIONGAP 9 11    No results for input(s): PROT, ALBUMIN, AST, ALT, ALKPHOS, BILITOT in the last 168 hours. Hematology Recent Labs  Lab 10/15/20 0005  WBC 7.5  RBC 5.02  HGB 15.2  HCT 45.9  MCV 91.4  MCH 30.3  MCHC 33.1  RDW 12.9  PLT 202   BNPNo results for input(s): BNP, PROBNP in the last 168 hours.  DDimer No results for input(s): DDIMER in the last 168 hours.   Radiology/Studies:  DG Chest 2 View  Result Date: 10/15/2020 CLINICAL DATA:  Chest pain and heaviness. Previous history of heart attack and stent placement EXAM: CHEST - 2 VIEW COMPARISON:  03/10/2017 FINDINGS: The heart size and mediastinal contours  are within normal limits. Both lungs are clear. The visualized skeletal structures are unremarkable. Mild hyperinflation. Degenerative changes in the spine. IMPRESSION: No active cardiopulmonary disease. Electronically Signed   By: Lucienne Capers M.D.   On: 10/15/2020 00:49     Assessment and Plan:   NSTEMI History of CAD - Patient has history of CAD with multiple stents in the past mostly recently in 2018. Presents with chest pain that radiated to left jaw at rest. Similar to  prior cardiac pain. - EKG shows no acute ischemic changes. - High-sensitivity troponin 43 >> 63. - Currently chest pain free. - Will order Echo. - Continue IV Heparin. - Think we can likely discontinue IV Nitro (patient has not had any chest pain since 2nd dose of sublingual Nitro).  - Continue Aspirin. Patient states he is no longer taking Brilinta. - He has Coreg 12.68m twice daily listed in chart but medications have not been reconciled yet and patient but unable to tell me exactly what he has been taking. Heart rates in the 50's to 60's so will hold off on beta-blocker for now. He did receive IV Lopressor in the ED. - Patient no longer taking Lipitor due to myalgias. Will try Crestor instead. - Suspect patient may need LHC today to re-evaluate coronary anatomy. Will discuss with MD. The patient understands that risks include but are not limited to stroke (1 in 1000), death (1 in 1000), kidney failure [usually temporary] (1 in 500), bleeding (1 in 200), allergic reaction [possibly serious] (1 in 200), and agrees to proceed if MD agrees this is necessary.  Hypertension - BP elevated. - PTA medications include: Amlodipine 556mtwice daily, HCTZ 2566maily, Losartan 100m10mily, Coreg 12.5mg 64mce daily. However, these have not been reconciled yet. Pharmacy has been asked to assist with this.  - Will go ahead and start Amlodipine, HCTZ, and Losartan. - Will hold on Coreg for now given low resting heart rates.  Hyperlipidemia - Patient previously on Lipitor but no longer taking this due to myalgias.  - Will start Crestor 20mg 61my and see if he can tolerate that. - Will checking fasting lipid panel.  Type 2 Diabetes Mellitus - Blood glucose in the 400's on arrival but have since improved some. Hemoglobin A1c pending. - On Metformin at home. Will hold while here and start sliding scale insulin.  - Will go ahead and consult diabetes coordinator.  Obstructive Sleep Apnea - CPAP machine has  been broken for about a year. - Followed by PCP.   Cirrhosis - Patient states he was recently started on something to help his ammonia levels (suspect lactulose but patient unsure. - Will ask Pharmacy to help reconcile medications.  - Will check LFTs.  Hematochezia - Patient reports blood in stools last weeks after starting new medications for his cirrhosis (sounds like lactulose). - Hemoglobin normal. - Will check hemoccult. - Can likely follow-up with GI as outpatient.  Tobacco Abuse - Patient recently started smoking again.  - Discussed the importance of complete cessation. Patient voices understanding and is committed to quitting again.   Risk Assessment/Risk Scores:   TIMI Risk Score for Unstable Angina or Non-ST Elevation MI:   The patient's TIMI risk score is 5, which indicates a 26% risk of all cause mortality, new or recurrent myocardial infarction or need for urgent revascularization in the next 14 days.{   Severity of Illness: The appropriate patient status for this patient is OBSERVATION. Observation status is judged to be reasonable and  necessary in order to provide the required intensity of service to ensure the patient's safety. The patient's presenting symptoms, physical exam findings, and initial radiographic and laboratory data in the context of their medical condition is felt to place them at decreased risk for further clinical deterioration. Furthermore, it is anticipated that the patient will be medically stable for discharge from the hospital within 2 midnights of admission. The following factors support the patient status of observation.   " The patient's presenting symptoms include chest pain and jaw pain. " The physical exam findings as above. " The initial radiographic and laboratory data are significant for elevated troponin.     For questions or updates, please contact [place] Please consult [email or web address] for contact info under      Signed, Darreld Mclean, PA-C  10/15/2020 9:04 AM   Patient seen, examined. Available data reviewed. Agree with findings, assessment, and plan as outlined by Sande Rives, PA-C.  The patient is independently interviewed and examined.  He is an obese gentleman in no acute distress.  HEENT is normal, JVP is normal, lungs are clear bilaterally, heart is regular rate and rhythm with no murmur gallop, abdomen is soft, obese, nontender, extremities have no edema.  Radial pulses are intact and equal bilaterally.  Skin is warm and dry without rash.  EKG is reviewed and shows normal sinus rhythm with ST/T wave abnormality consider inferolateral ischemia.  Lab data is pertinent for mildly elevated troponin of 43 and 63.  Glucose is uncontrolled at 433 on most recent labs.  I reviewed the patient's coronary angiogram and PCI results from 2018 when he underwent stenting of the RCA and the posterolateral branch.  He was noted to have nonobstructive left main and LAD stenoses at that time.  Ongoing risk factors of uncontrolled diabetes and tobacco abuse.  The patient now presents with pretty typical symptoms of crescendo angina at rest as he describes a substernal chest pressure that was relieved with sublingual nitroglycerin.  I think definitive evaluation with cardiac catheterization and possible PCI is indicated. I have reviewed the risks, indications, and alternatives to cardiac catheterization, possible angioplasty, and stenting with the patient. Risks include but are not limited to bleeding, infection, vascular injury, stroke, myocardial infection, arrhythmia, kidney injury, radiation-related injury in the case of prolonged fluoroscopy use, emergency cardiac surgery, and death. The patient understands the risks of serious complication is 1-2 in 1017 with diagnostic cardiac cath and 1-2% or less with angioplasty/stenting.  Agree with consulting the diabetes nurse for recommendations on his diabetes medication.   Hold metformin while here.  Cover with sliding scale insulin.  Discussed tobacco cessation and he understands and seems motivated to quit.  Will need aggressive lifestyle modification.  Further plans/disposition pending cardiac catheterization results.  Sherren Mocha, M.D. 10/15/2020 11:36 AM

## 2020-10-15 NOTE — Progress Notes (Addendum)
Inpatient Diabetes Program Recommendations  AACE/ADA: New Consensus Statement on Inpatient Glycemic Control (2015)  Target Ranges:  Prepandial:   less than 140 mg/dL      Peak postprandial:   less than 180 mg/dL (1-2 hours)      Critically ill patients:  140 - 180 mg/dL   Lab Results  Component Value Date   GLUCAP 187 (H) 10/15/2020    Review of Glycemic Control Results for Dwayne Garrett, Dwayne Garrett (MRN [id number]) as of 10/15/2020 09:32  Ref. Range 10/15/2020 01:51 10/15/2020 04:01 10/15/2020 05:54 10/15/2020 08:19  Glucose-Capillary Latest Ref Range: 70 - 99 mg/dL 411 (H) 239 (H) 205 (H) 187 (H)   Diabetes history: DM2 Outpatient Diabetes medications: Metformin 1 gm qd Current orders for Inpatient glycemic control: Novolog correction 0-15 units tid with meals  Inpatient Diabetes Program Recommendations:   Received consult regarding elevated CBG on admission of 411. Noted A1c is pending. Please consider: -Novolog correction to q 4 hrs while NPO. Ordered Living Well With Diabetes book and will follow patient for additional needs for medication management.  Educated patient and spouse on insulin pen use at home. Reviewed contents of insulin flexpen starter kit. Reviewed all steps if insulin pen including attachment of needle, 2-unit air shot, dialing up dose, giving injection, removing needle, disposal of sharps, storage of unused insulin, disposal of insulin etc. Patient able to provide successful return demonstration. Also reviewed troubleshooting with insulin pen. MD to give patient Rxs for insulin pens and insulin pen needles.  Patient states his meter is broken and needs a new one to check CBGs.  On discharge; CBG meter & supplies [phone]  Spoke with pt about A1C results 12.7 (average blood glucose 309 over the past 2-3 months) and explained what an A1C is, basic pathophysiology of DM Type 2, basic home care, basic diabetes diet nutrition principles, importance of checking CBGs and  maintaining good CBG control to prevent long-term and short-term complications. Reviewed signs and symptoms of hyperglycemia and hypoglycemia and how to treat hypoglycemia at home. Also reviewed blood sugar goals at home.  RNs to provide ongoing basic DM education at bedside with this patient. Have ordered educational booklet, insulin starter kit, and DM videos. Have also placed RD consult for DM diet education for this patient.     Thank you, Nani Gasser. Nikita Surman, RN, MSN, CDE  Diabetes Coordinator Inpatient Glycemic Control Team Team Pager [phone] (8am-5pm) 10/15/2020 9:38 AM

## 2020-10-15 NOTE — Progress Notes (Signed)
ANTICOAGULATION CONSULT NOTE - Follow Up Consult  Pharmacy Consult for Heparin  Indication: chest pain/ACS  Allergies  Allergen Reactions  . Nyquil Multi-Symptom [Pseudoeph-Doxylamine-Dm-Apap]     Feel jittery   . Tylenol [Acetaminophen]     Due to liver    Patient Measurements: Height: 5' 9"  (175.3 cm) Weight: 95.3 kg (210 lb) IBW/kg (Calculated) : 70.7  Vital Signs: Temp: 97.7 F (36.5 C) (04/27 0701) Temp Source: Oral (04/27 0701) BP: 167/92 (04/27 1555) Pulse Rate: 64 (04/27 1555)  Labs: Recent Labs    10/15/20 0005 10/15/20 0155  HGB 15.2  --   HCT 45.9  --   PLT 202  --   CREATININE 0.65 0.60*  TROPONINIHS 43* 63*    Estimated Creatinine Clearance: 107.6 mL/min (A) (by C-G formula based on SCr of 0.6 mg/dL (L)).   Medical History: Past Medical History:  Diagnosis Date  . Abnormal LFTs    Cirrhosis per liver biopsy - follows with Dr. Laural Golden  . Bilateral leg edema    Chronic  . Castleman's disease ([place])   . Coronary atherosclerosis of native coronary artery    Multivessel (moderate), DES RCA 4/11, LVEF 60-65%. LHC 03/11/17: DES to dist RCA & 3rd left ventricular branch  . Essential hypertension, benign   . Inferior myocardial infarction ([place])    09/2009  . OSA (obstructive sleep apnea)    on CPAP    Assessment: Dwayne Garrett is a 65 y.o. male who presents to the ED with intermittent chest pain this past evening. Found to have mildly elevated troponin. Starting heparin per pharmacy. CBC/renal function good. PTA meds reviewed.   S/p cath lab with 3v CAD and w/u for CABG.  Plan to resume heparin 8hr post sheath removal.  Sheath out 1530 resume heparin at 2230    Goal of Therapy:  Heparin level 0.3-0.7 units/ml Monitor platelets by anticoagulation protocol: Yes   Plan:  Resume  heparin drip at 1200 units/hr Daily CBC/Heparin level Monitor for bleeding    Bonnita Nasuti Pharm.D. CPP, BCPS Clinical Pharmacist [phone] 10/15/2020 4:35  PM

## 2020-10-15 NOTE — Progress Notes (Addendum)
Progress Note  Patient Name: Dwayne Garrett Date of Encounter: 10/15/2020  Primary Cardiologist: Rozann Lesches, MD   Subjective   Underwent LHC which showed three vessel and LM disease>>TCTS consult for revascularization. Denies chest pain, SOB. Plan for OR early next week per brief note.   Inpatient Medications    Scheduled Meds: . amLODipine  5 mg Oral Daily  . aspirin  81 mg Oral Daily  . gabapentin  300 mg Oral Daily  . hydrochlorothiazide  25 mg Oral Daily  . insulin aspart  0-15 Units Subcutaneous Q4H  . lactulose  10 g Oral BID  . losartan  100 mg Oral Daily  . pantoprazole  40 mg Oral Daily  . rosuvastatin  20 mg Oral Daily  . sodium chloride flush  3 mL Intravenous Q12H   Continuous Infusions: . sodium chloride    . sodium chloride    . heparin     PRN Meds: sodium chloride, nitroGLYCERIN, ondansetron (ZOFRAN) IV, sodium chloride flush   Vital Signs    Vitals:   10/15/20 1527 10/15/20 1532 10/15/20 1537 10/15/20 1555  BP: (!) 144/93 (!) 143/84 (!) 163/93 (!) 167/92  Pulse: 63 65 68 64  Resp: (!) 44 (!) 42 15 16  Temp:      TempSrc:      SpO2: 96% 93% 95% 96%  Weight:      Height:        Intake/Output Summary (Last 24 hours) at 10/15/2020 1645 Last data filed at 10/15/2020 1506 Gross per 24 hour  Intake 731.57 ml  Output --  Net 731.57 ml   Filed Weights   10/15/20 0003  Weight: 95.3 kg    Physical Exam   General: Well developed, well nourished, NAD Neck: Negative for carotid bruits. No JVD Lungs:Clear to ausculation bilaterally. No wheezes, rales, or rhonchi. Breathing is unlabored. Cardiovascular: RRR with S1 S2. No murmurs Abdomen: Soft, non-tender, distended. No obvious abdominal masses. Extremities: No edema. Radial pulses 2+ bilaterally Neuro: Alert and oriented. No focal deficits. No facial asymmetry. MAE spontaneously. Psych: Responds to questions appropriately with normal affect.    Labs    Chemistry Recent Labs  Lab  10/15/20 0005 10/15/20 0155 10/15/20 1142  NA 131* 132*  --   K 4.0 3.9  --   CL 98 98  --   CO2 24 23  --   GLUCOSE 490* 433*  --   BUN 17 16  --   CREATININE 0.65 0.60*  --   CALCIUM 9.7 9.5  --   PROT  --   --  6.7  ALBUMIN  --   --  3.4*  AST  --   --  98*  ALT  --   --  109*  ALKPHOS  --   --  60  BILITOT  --   --  1.4*  GFRNONAA >60 >60  --   ANIONGAP 9 11  --      Hematology Recent Labs  Lab 10/15/20 0005  WBC 7.5  RBC 5.02  HGB 15.2  HCT 45.9  MCV 91.4  MCH 30.3  MCHC 33.1  RDW 12.9  PLT 202    Cardiac EnzymesNo results for input(s): TROPONINI in the last 168 hours. No results for input(s): TROPIPOC in the last 168 hours.   BNPNo results for input(s): BNP, PROBNP in the last 168 hours.   DDimer No results for input(s): DDIMER in the last 168 hours.   Radiology  DG Chest 2 View  Result Date: 10/15/2020 CLINICAL DATA:  Chest pain and heaviness. Previous history of heart attack and stent placement EXAM: CHEST - 2 VIEW COMPARISON:  03/10/2017 FINDINGS: The heart size and mediastinal contours are within normal limits. Both lungs are clear. The visualized skeletal structures are unremarkable. Mild hyperinflation. Degenerative changes in the spine. IMPRESSION: No active cardiopulmonary disease. Electronically Signed   By: Lucienne Capers M.D.   On: 10/15/2020 00:49   CARDIAC CATHETERIZATION  Result Date: 10/15/2020  Prox Cx lesion is 30% stenosed.  Previously placed Mid RCA to Dist RCA drug eluting stent is widely patent.  Previously placed 3rd RPL drug eluting stent is widely patent.  Prox RCA lesion is 95% stenosed.  RPAV lesion is 99% stenosed.  RPDA lesion is 70% stenosed.  1st RPL lesion is 80% stenosed.  RV Branch lesion is 80% stenosed.  Mid LM lesion is 60% stenosed.  2nd Mrg lesion is 80% stenosed.  Prox LAD to Mid LAD lesion is 60% stenosed.  Previously placed 1st Diag-1 stent (unknown type) is widely patent.  1st Diag-2 lesion is 80%  stenosed.  1.  Significant left main and three-vessel coronary artery disease. 2.  Left ventricular angiography was not performed.  Ejection fraction appeared normal by echo.  Mildly elevated left ventricular end-diastolic pressure. Recommendations: Recommend evaluation for CABG.  Resume heparin 8 hours after sheath pull.   Telemetry    10/16/20 NSR with HR in the 60's - Personally Reviewed  ECG    No new tracing as of 10/16/20- Personally Reviewed  Cardiac Studies   LHC 10/15/20:   Prox Cx lesion is 30% stenosed.  Previously placed Mid RCA to Dist RCA drug eluting stent is widely patent.  Previously placed 3rd RPL drug eluting stent is widely patent.  Prox RCA lesion is 95% stenosed.  RPAV lesion is 99% stenosed.  RPDA lesion is 70% stenosed.  1st RPL lesion is 80% stenosed.  RV Branch lesion is 80% stenosed.  Mid LM lesion is 60% stenosed.  2nd Mrg lesion is 80% stenosed.  Prox LAD to Mid LAD lesion is 60% stenosed.  Previously placed 1st Diag-1 stent (unknown type) is widely patent.  1st Diag-2 lesion is 80% stenosed.   1.  Significant left main and three-vessel coronary artery disease. 2.  Left ventricular angiography was not performed.  Ejection fraction appeared normal by echo.  Mildly elevated left ventricular end-diastolic pressure.  Recommendations: Recommend evaluation for CABG.  Resume heparin 8 hours after sheath pull.  Echocardiogram 10/15/20:  1. Left ventricular ejection fraction, by estimation, is 60 to 65%. The  left ventricle has normal function. The left ventricle has no regional  wall motion abnormalities. There is moderate left ventricular hypertrophy.  Left ventricular diastolic  parameters are indeterminate.  2. Right ventricular systolic function is normal. The right ventricular  size is normal. There is mildly elevated pulmonary artery systolic  pressure. The estimated right ventricular systolic pressure is 88.4 mmHg.  3. The mitral  valve is normal in structure. Trivial mitral valve  regurgitation.  4. The aortic valve is tricuspid. Aortic valve regurgitation is not  visualized. No aortic stenosis is present.  5. The inferior vena cava is normal in size with greater than 50%  respiratory variability, suggesting right atrial pressure of 3 mmHg.    Left Cardiac Catheterization 03/11/2017:  Unstable angina/non-ST elevation myocardial infarction presentation.  The previously stented proximal to mid RCA is widely patent  De novo 95-99% distal RCA  before the origin of the PDA.  De novo 95% stenosis in the third left ventricular branch of the right coronary artery.  Moderate obstruction in the trunk of the left main, 40%. This is unchanged from prior angiogram.  Moderate eccentric 40-50% proximal to mid LAD. Diffuse 30-50% disease in the mid and distal LAD. The largest previously stented diagonal reveals no evidence of in-stent restenosis.  Normal left ventricular size and function with mildly elevated LVEDP consistent with diastolic heart failure, asymptomatic. EF 60%.  Successful stenting of the distal RCA and third left ventricular branch reducing stenoses to 0% and 0% with TIMI grade 3 flow. Onyx 3.5 x 18 and 2.25 x 15 DES deployed at 15 atm in distal RCA and third LV branch respectively. TIMI grade 3 flow is noted beyond age treatment area.  Recommendations:  This gentleman needs aggressive risk factor modification he has moderate to moderately severe left main and LAD disease although I do not believe they are critical and surgical therapy at this time would risk graft occlusion because native flow is not impaired.  Aspirin and Brilinta 12 months  Hopeful discharge in a.m.  Patient Profile     65 y.o. male with a history of multivessel CAD s/p multiple PCIs, hypertension, hyperlipidemia, type 2 diabetes mellitus, obstructive sleep apnea (CPAP machine broken), cirrhosis, and Castleman's disease who was  transferred from [facility] for further management of NSTEMI after presenting there with chest pain.  Assessment & Plan    1. NSTEMI with CAD: -Pt with a hx of CAD with multiple stents in the past, most recently in 2018 who presented with chest pain with radiation to the left jaw while at rest>>LHC performed yesterday which showed significant LM and three-vessel coronary artery disease. -Briefly seen by TCTS with plans for revascularization early next week.  -HsT 43>>>63 -Echocardiogram performed 10/15/20 which showed LVEF at 60-65% with no RWMA, mod LVH, mildly elevated PASP at 35.46mHg, and trivial MR -Heparin resumed hours after sheath pull   -Continue ASA, carvedilol. No statin in the setting of intolerance   2. HTN: -Elevated, 189/98>>165/80>>163/85 -Will increase amlodipine to 11mtoday and restart home carvedilol 2553mID and losartan 100m60mStop HCTZ and start Spironolactone   3. HLD: -LDL, 202 with known intolerance to atorvastatin>>started on Crestor 20mg33m to hx of cirrhosis  -Will need lipid clinic referral after hospital discharge for possible PCSK9 inhibitor  -Continue Crestor 20mg 16m 4. DM2: -HbA1c, 12.4 on 10/15/20 -SSI for glucose control while inpatient  -Poor recall on home medications -DM coordinator to make recommendations  -Plan will likely include home insulin -Not an ideal candidate for SGLT2 at this time until A1C lower   5. Cirrhosis: -AST/ALT>>98/109>>previously 54/95 from 08/21/2019 -Pt restarted on home Lactulose    6. Hematochezia: -Hb WNL at 14.8 -Hemeoccult stool>>pending    7. Tobacco use: -Cessation strongly encouraged  -Will assess the need for nicotine patch    Signed, Jill MKathyrn DrownHeartCParry[place]: [phone], 4:45 PM     For questions or updates, please contact   Please consult [email or web address] for contact info under Cardiology/STEMI.  Patient seen, examined. Available data reviewed. Agree with findings,  assessment, and plan as outlined by Jill MKathyrn Drown The patient is independently interviewed and examined.  He is alert, oriented, no distress, lungs are clear, heart is regular rate and rhythm with no murmur gallop, abdomen soft nontender with no masses, extremities have no edema.  Telemetry shows normal sinus rhythm  without significant arrhythmia.  Appreciate cardiac surgical evaluation.  Tentative plans for CABG early next week.  The patient will remain hospitalized as he has critical stenosis of the RCA, has developed resting angina, and remains on IV heparin.  He has not had any chest discomfort overnight and is currently stable.  Adjustments in his antihypertensive regimen and diabetes regimen as outlined above.  Sherren Mocha, M.D. 10/16/2020 12:09 PM

## 2020-10-15 NOTE — Progress Notes (Signed)
     [address]       [place] [address]             [phone]       Full consult to follow Chart and images reviewed Good targets for surgical bypass.  No significant valvular disease, and preserved function on my read.  OR early next week.

## 2020-10-15 NOTE — Care Management (Signed)
10-15-20 Case Manager received notification that the patient will need a Condition Code 44. Patient does not have a Medicare Benefit @ this time. Patient has the [place] Benefit only. Case Manager clarified with the Director of Utilization Review that the patient will not receive a [place].  Case Manager called the [employer] to make them aware that the patient is hospitalized. Authorization for Forestine Na is [id number]. Authorization for Zacarias Pontes is [id number]. Patient is aware of authorization. Patient is a member of the [employer] is [employer]. Case Manager will continue to follow for additional transition of care needs. Graves-Bigelow, Ocie Cornfield, RN, BSN Case Manager

## 2020-10-15 NOTE — ED Triage Notes (Signed)
Pt c/o chest pain intermittently since 2200. Pt states it is a heaviness and has taken nitro with relief.

## 2020-10-15 NOTE — Progress Notes (Signed)
   Received update from Pharmacy on med reconciliation: "Med rec was done - he doesn't know his meds. Pharmacy was only able to get verified fills from the [place] in the last 90 days for Omeprazole 71m daily, Coreg 266mtwice daily, Lactulose 1061mwice daily, Metformin 1000m50mice daily, and Losartan 100mg65mly."  Plan:  - I have ordered Lactulose here.  - Will continue current BP medications for now. Can adjust if needed following cath.  CalliDarreld McleanC 10/15/2020 1:33 PM

## 2020-10-15 NOTE — Progress Notes (Signed)
ANTICOAGULATION CONSULT NOTE - Initial Consult  Pharmacy Consult for Heparin  Indication: chest pain/ACS  Allergies  Allergen Reactions  . Nyquil Multi-Symptom [Pseudoeph-Doxylamine-Dm-Apap]     Feel jittery   . Tylenol [Acetaminophen]     Due to liver    Patient Measurements: Height: 5' 9"  (175.3 cm) Weight: 95.3 kg (210 lb) IBW/kg (Calculated) : 70.7  Vital Signs: BP: 152/75 (04/27 0100) Pulse Rate: 64 (04/27 0100)  Labs: Recent Labs    10/15/20 0005  HGB 15.2  HCT 45.9  PLT 202  CREATININE 0.65  TROPONINIHS 43*    Estimated Creatinine Clearance: 107.6 mL/min (by C-G formula based on SCr of 0.65 mg/dL).   Medical History: Past Medical History:  Diagnosis Date  . Abnormal LFTs    Cirrhosis per liver biopsy - follows with Dr. Laural Golden  . Bilateral leg edema    Chronic  . Castleman's disease ([place])   . Coronary atherosclerosis of native coronary artery    Multivessel (moderate), DES RCA 4/11, LVEF 60-65%. LHC 03/11/17: DES to dist RCA & 3rd left ventricular branch  . Essential hypertension, benign   . 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 OSA (obstructive sleep apnea)    on CPAP    Assessment: Dwayne Garrett is a 65 y.o. male who presents to the ED with intermittent chest pain this past evening. Found to have mildly elevated troponin. Starting heparin per pharmacy. CBC/renal function good. PTA meds reviewed.   Goal of Therapy:  Heparin level 0.3-0.7 units/ml Monitor platelets by anticoagulation protocol: Yes   Plan:  Heparin 4000 units BOLUS Start heparin drip at 1200 units/hr 1100 Heparin level Daily CBC/Heparin level Monitor for bleeding  Narda Bonds, PharmD, BCPS Clinical Pharmacist Phone: [phone]

## 2020-10-15 NOTE — Interval H&P Note (Signed)
Cath Lab Visit (complete for each Cath Lab visit)  Clinical Evaluation Leading to the Procedure:   ACS: Yes.    Non-ACS:  n/a     History and Physical Interval Note:  10/15/2020 3:17 PM  Dwayne Garrett  has presented today for surgery, with the diagnosis of unstable angina.  The various methods of treatment have been discussed with the patient and family. After consideration of risks, benefits and other options for treatment, the patient has consented to  Procedure(s): LEFT HEART CATH AND CORONARY ANGIOGRAPHY (N/A) as a surgical intervention.  The patient's history has been reviewed, patient examined, no change in status, stable for surgery.  I have reviewed the patient's chart and labs.  Questions were answered to the patient's satisfaction.     Kathlyn [place]

## 2020-10-15 NOTE — ED Notes (Signed)
[facility] here to pick up pt for transport.

## 2020-10-15 NOTE — ED Provider Notes (Signed)
[facility] EMERGENCY DEPARTMENT Provider Note   CSN: [id number] Arrival date & time: 10/14/20  2357     History Chief Complaint  Patient presents with  . Chest Pain    COLEMAN KALAS is a 65 y.o. male.  Patient presents to the emergency department for evaluation of chest pain.  Patient reports that he has a history of multivessel coronary artery disease with previous stenting.  He is told that he has residual blockages that cannot be stented.  Patient reports that he has been having pain intermittently over the last couple of weeks which is unusual.  He has had to use nitroglycerin several times.  Tonight he developed pain while watching TV.  Pain was across the center of his chest and radiated to the jaw bilaterally.  He took nitroglycerin and pain went away.  He went to sleep for about an hour and then woke up with pain again.        Past Medical History:  Diagnosis Date  . Abnormal LFTs    Cirrhosis per liver biopsy - follows with Dr. Laural Golden  . Bilateral leg edema    Chronic  . 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 Coronary atherosclerosis of native coronary artery    Multivessel (moderate), DES RCA 4/11, LVEF 60-65%. LHC 03/11/17: DES to dist RCA & 3rd left ventricular branch  . Essential hypertension, benign   . 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 OSA (obstructive sleep apnea)    on CPAP    Patient Active Problem List   Diagnosis Date Noted  . Unstable angina ([place]) 03/10/2017  . NSTEMI (non-ST elevated myocardial infarction) ([place]) 03/10/2017  . HTN (hypertension) 03/10/2017  . Diabetes mellitus type 2 with complications ([place]) 84/13/2440  . GERD (gastroesophageal reflux disease) 03/10/2017  . Coronary artery disease involving native coronary artery of native heart without angina pectoris   . Preoperative cardiovascular examination 03/06/2014  . Lower extremity edema 04/20/2011  . Mixed hyperlipidemia 05/04/2010  . SLEEP APNEA, OBSTRUCTIVE 10/20/2009  .  Coronary atherosclerosis of native coronary artery 10/20/2009  . Enlarged lymph nodes 10/20/2009    Past Surgical History:  Procedure Laterality Date  . BONE CYST EXCISION     Cyst removal from neck  . CARDIAC CATHETERIZATION N/A 12/11/2014   Procedure: Left Heart Cath and Coronary Angiography;  Surgeon: Burnell Blanks, MD;  Location: [place] CV LAB;  Service: Cardiovascular;  Laterality: N/A;  . CORONARY STENT INTERVENTION N/A 03/11/2017   Procedure: CORONARY STENT INTERVENTION;  Surgeon: Belva Crome, MD;  Location: [place] [facility];  Service: Cardiovascular;  Laterality: N/A;  . ESOPHAGOGASTRODUODENOSCOPY (EGD) WITH PROPOFOL N/A 09/06/2019   Procedure: ESOPHAGOGASTRODUODENOSCOPY (EGD) WITH PROPOFOL;  Surgeon: Jonathon Bellows, MD;  Location[facility];  Service: Gastroenterology;  Laterality: N/A;  . LEFT HEART CATH AND CORONARY ANGIOGRAPHY N/A 03/11/2017   Procedure: LEFT HEART CATH AND CORONARY ANGIOGRAPHY;  Surgeon: Belva Crome, MD;  Location: [place] [facility];  Service: Cardiovascular;  Laterality: N/A;  . SKIN GRAFT     Finger  . TUMOR REMOVAL     Abdominal  . ULTRASOUND GUIDANCE FOR VASCULAR ACCESS  03/11/2017   Procedure: Ultrasound Guidance For Vascular Access;  Surgeon: Belva Crome, MD;  Location: [place] [facility];  Service: Cardiovascular;;       Family History  Problem Relation Age of Onset  . Heart attack Brother 45    Social History   Tobacco Use  . Smoking status:  Current Some Day Smoker    Packs/day: 0.25    Types: Cigarettes    Last attempt to quit: 07/01/2004    Years since quitting: 16.3  . Smokeless tobacco: Current User    Types: Chew  . 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 Vaping Use: Former  Substance Use Topics  . Alcohol use: No  . Drug use: No    Home Medications Prior to Admission medications   Medication Sig Start Date End Date Taking? Authorizing Provider  amLODipine (NORVASC) 5  MG tablet Take 1 tablet (5 mg total) by mouth 2 (two) times daily. 04/07/17   Satira Sark, MD  aspirin 81 MG chewable tablet Chew 1 tablet (81 mg total) by mouth daily. 03/12/17   Daune Perch, NP  atorvastatin (LIPITOR) 40 MG tablet Take 1 tablet (40 mg total) by mouth daily. 06/26/18 09/06/19  Satira Sark, MD  lidocaine (LIDODERM) 5 % Place 1 patch onto the skin daily. Remove & Discard patch within 12 hours or as directed by MD 07/19/19   Henderly, Britni A, PA-C  losartan (COZAAR) 100 MG tablet Take 100 mg by mouth daily.    [provider]  metFORMIN (GLUCOPHAGE) 500 MG tablet Take 1,000 mg by mouth every morning.  11/27/14   [provider]  Multiple Vitamin (MULTIVITAMIN WITH MINERALS) TABS tablet Take 1 tablet by mouth daily.    [provider]  NITROSTAT 0.4 MG SL tablet PLACE 1 TABLET UNDER TONGUE EVERY FIVE MINUTES AS DIRECTED 12/16/15   Satira Sark, MD  Omega-3 Fatty Acids (FISH OIL) 1000 MG CAPS Take 3,000 mg by mouth daily.    [provider]  omeprazole (PRILOSEC) 40 MG capsule TAKE 1 CAPSULE (40 MG TOTAL) BY MOUTH DAILY. 01/24/17   Setzer, Rona Ravens, NP  ONETOUCH VERIO test strip TEST DAILY E11.9 10/24/14   [provider]  oxyCODONE (ROXICODONE) 5 MG immediate release tablet Take 1 tablet (5 mg total) by mouth every 4 (four) hours as needed for severe pain. 07/19/19   Henderly, Britni A, PA-C  sildenafil (VIAGRA) 100 MG tablet Take 100 mg by mouth daily as needed for erectile dysfunction.    [provider]  ticagrelor (BRILINTA) 60 MG TABS tablet Take 1 tablet (60 mg total) by mouth 2 (two) times daily. Patient not taking: Reported on 07/18/2019 10/17/18   Satira Sark, MD    Allergies    Nyquil multi-symptom [pseudoeph-doxylamine-dm-apap] and Tylenol [acetaminophen]  Review of Systems   Review of Systems  Cardiovascular: Positive for chest pain.  All other systems reviewed and are negative.   Physical  Exam Updated Vital Signs BP (!) 152/75   Pulse 64   Resp 17   Ht 5' 9"  (1.753 m)   Wt 95.3 kg   SpO2 98%   BMI 31.01 kg/m   Physical Exam Vitals and nursing note reviewed.  Constitutional:      General: He is not in acute distress.    Appearance: Normal appearance. He is well-developed.  HENT:     Head: Normocephalic and atraumatic.     Right Ear: Hearing normal.     Left Ear: Hearing normal.     Nose: Nose normal.  Eyes:     Conjunctiva/sclera: Conjunctivae normal.     Pupils: Pupils are equal, round, and reactive to light.  Cardiovascular:     Rate and Rhythm: Regular rhythm.     Heart sounds:  S1 normal and S2 normal. No murmur heard. No friction rub. No gallop.   Pulmonary:     Effort: Pulmonary effort is normal. No respiratory distress.     Breath sounds: Normal breath sounds.  Chest:     Chest wall: No tenderness.  Abdominal:     General: Bowel sounds are normal.     Palpations: Abdomen is soft.     Tenderness: There is no abdominal tenderness. There is no guarding or rebound. Negative signs include Murphy's sign and McBurney's sign.     Hernia: No hernia is present.  Musculoskeletal:        General: Normal range of motion.     Cervical back: Normal range of motion and neck supple.  Skin:    General: Skin is warm and dry.     Findings: No rash.  Neurological:     Mental Status: He is alert and oriented to person, place, and time.     GCS: GCS eye subscore is 4. GCS verbal subscore is 5. GCS motor subscore is 6.     Cranial Nerves: No cranial nerve deficit.     Sensory: No sensory deficit.     Coordination: Coordination normal.  Psychiatric:        Speech: Speech normal.        Behavior: Behavior normal.        Thought Content: Thought content normal.     ED Results / Procedures / Treatments   Labs (all labs ordered are listed, but only abnormal results are displayed) Labs Reviewed  BASIC METABOLIC PANEL - Abnormal; Notable for the following  components:      Result Value   Sodium 131 (*)    Glucose, Bld 490 (*)    All other components within normal limits  TROPONIN I (HIGH SENSITIVITY) - Abnormal; Notable for the following components:   Troponin I (High Sensitivity) 43 (*)    All other components within normal limits  RESP PANEL BY RT-PCR (FLU A&B, COVID) ARPGX2  CBC  HEMOGLOBIN A1C  HEPARIN LEVEL (UNFRACTIONATED)  TROPONIN I (HIGH SENSITIVITY)    EKG EKG Interpretation  Date/Time:  Wednesday October 15 2020 00:07:26 EDT Ventricular Rate:  72 PR Interval:  160 QRS Duration: 96 QT Interval:  386 QTC Calculation: 423 R Axis:   78 Text Interpretation: Sinus rhythm Borderline repol abnormality, diffuse leads Confirmed by Orpah Greek [id number]) on 10/15/2020 12:12:55 AM   Radiology DG Chest 2 View  Result Date: 10/15/2020 CLINICAL DATA:  Chest pain and heaviness. Previous history of heart attack and stent placement EXAM: CHEST - 2 VIEW COMPARISON:  03/10/2017 FINDINGS: The heart size and mediastinal contours are within normal limits. Both lungs are clear. The visualized skeletal structures are unremarkable. Mild hyperinflation. Degenerative changes in the spine. IMPRESSION: No active cardiopulmonary disease. Electronically Signed   By: Lucienne Capers M.D.   On: 10/15/2020 00:49    Procedures Procedures   Medications Ordered in ED Medications  nitroGLYCERIN (NITROSTAT) SL tablet 0.4 mg (has no administration in time range)  nitroGLYCERIN 50 mg in dextrose 5 % 250 mL (0.2 mg/mL) infusion (5 mcg/min Intravenous New Bag/Given 10/15/20 0144)  insulin aspart (novoLOG) injection 0-15 Units (has no administration in time range)  heparin ADULT infusion 100 units/mL (25000 units/240m) (1,200 Units/hr Intravenous Infusion Verify 10/15/20 0142)  sodium chloride flush (NS) 0.9 % injection 3 mL (3 mLs Intravenous Given 10/15/20 0048)  aspirin chewable tablet 324 mg (324 mg Oral Given 10/15/20 0053)  metoprolol  tartrate  (LOPRESSOR) injection 5 mg (5 mg Intravenous Given 10/15/20 0053)  heparin bolus via infusion 4,000 Units (4,000 Units Intravenous Bolus from Bag 10/15/20 0148)    ED Course  I have reviewed the triage vital signs and the nursing notes.  Pertinent labs & imaging results that were available during my care of the patient were reviewed by me and considered in my medical decision making (see chart for details).    MDM Rules/Calculators/A&P                          Patient with known history of heart disease presents to the emergency department for evaluation of chest pain.  He has been experiencing pain for the last couple of weeks, requiring nitroglycerin several times.  This is a change for him.  Tonight he had pain at rest that went away with nitroglycerin and then returned approximately an hour later.  Pain is a heaviness in his chest that radiates to the jaw.  This is similar to cardiac pain he has had in the past.  EKG does not show any significant change but his first troponin is mildly elevated.  Patient hypertensive at arrival as well.  He was given aspirin, Lopressor, sublingual nitroglycerin and his pain has resolved.  Patient initiated on heparin and nitroglycerin drip.   CRITICAL CARE Performed by: Orpah Greek   Total critical care time: 35 minutes  Critical care time was exclusive of separately billable procedures and treating other patients.  Critical care was necessary to treat or prevent imminent or life-threatening deterioration.  Critical care was time spent personally by me on the following activities: development of treatment plan with patient and/or surrogate as well as nursing, discussions with consultants, evaluation of patient's response to treatment, examination of patient, obtaining history from patient or surrogate, ordering and performing treatments and interventions, ordering and review of laboratory studies, ordering and review of radiographic studies,  pulse oximetry and re-evaluation of patient's condition.  Final Clinical Impression(s) / ED Diagnoses Final diagnoses:  NSTEMI (non-ST elevated myocardial infarction) [facility])    Rx / DC Orders ED Discharge Orders    None       Laurana Magistro, Gwenyth Allegra, MD 10/17/20 0630

## 2020-10-15 NOTE — Progress Notes (Signed)
*  PRELIMINARY RESULTS* Echocardiogram 2D Echocardiogram has been performed.  Dwayne Garrett 10/15/2020, 2:22 PM

## 2020-10-16 ENCOUNTER — Encounter (HOSPITAL_COMMUNITY): Payer: Self-pay | Admitting: Cardiovascular Disease

## 2020-10-16 ENCOUNTER — Observation Stay (HOSPITAL_COMMUNITY): Payer: No Typology Code available for payment source

## 2020-10-16 DIAGNOSIS — Z01810 Encounter for preprocedural cardiovascular examination: Secondary | ICD-10-CM | POA: Diagnosis not present

## 2020-10-16 DIAGNOSIS — I25110 Atherosclerotic heart disease of native coronary artery with unstable angina pectoris: Secondary | ICD-10-CM

## 2020-10-16 DIAGNOSIS — I214 Non-ST elevation (NSTEMI) myocardial infarction: Secondary | ICD-10-CM

## 2020-10-16 LAB — GLUCOSE, CAPILLARY
Glucose-Capillary: 359 mg/dL — ABNORMAL HIGH (ref 70–99)
Glucose-Capillary: 376 mg/dL — ABNORMAL HIGH (ref 70–99)
Glucose-Capillary: 161 mg/dL — ABNORMAL HIGH (ref 70–99)
Glucose-Capillary: 193 mg/dL — ABNORMAL HIGH (ref 70–99)

## 2020-10-16 LAB — BLOOD GAS, ARTERIAL
Acid-Base Excess: 0.9 mmol/L (ref 0.0–2.0)
Bicarbonate: 24.6 mmol/L (ref 20.0–28.0)
FIO2: 21
O2 Saturation: 95.8 %
Patient temperature: 37
pCO2 arterial: 37.2 mmHg (ref 32.0–48.0)
pH, Arterial: 7.436 (ref 7.350–7.450)
pO2, Arterial: 74.3 mmHg — ABNORMAL LOW (ref 83.0–108.0)

## 2020-10-16 LAB — BASIC METABOLIC PANEL
Anion gap: 10 (ref 5–15)
BUN: 9 mg/dL (ref 8–23)
CO2: 23 mmol/L (ref 22–32)
Calcium: 9 mg/dL (ref 8.9–10.3)
Chloride: 103 mmol/L (ref 98–111)
Creatinine, Ser: 0.62 mg/dL (ref 0.61–1.24)
GFR, Estimated: >60 mL/min (ref >60)
Glucose, Bld: 280 mg/dL — ABNORMAL HIGH (ref 70–99)
Potassium: 3.7 mmol/L (ref 3.5–5.1)
Sodium: 136 mmol/L (ref 135–145)

## 2020-10-16 LAB — ABO/RH: ABO/RH(D): O NEG

## 2020-10-16 LAB — CBC
HCT: 45 % (ref 39.0–52.0)
Hemoglobin: 14.8 g/dL (ref 13.0–17.0)
MCH: 30.3 pg (ref 26.0–34.0)
MCHC: 32.9 g/dL (ref 30.0–36.0)
MCV: 92.2 fL (ref 80.0–100.0)
Platelets: 197 10*3/uL (ref 150–400)
RBC: 4.88 MIL/uL (ref 4.22–5.81)
RDW: 13.2 % (ref 11.5–15.5)
WBC: 7.3 10*3/uL (ref 4.0–10.5)
nRBC: 0 % (ref 0.0–0.2)

## 2020-10-16 LAB — URINALYSIS, ROUTINE W REFLEX MICROSCOPIC
Bacteria, UA: NONE SEEN
Bilirubin Urine: NEGATIVE
Glucose, UA: >=500 mg/dL — AB
Hgb urine dipstick: NEGATIVE
Ketones, ur: NEGATIVE mg/dL
Leukocytes,Ua: NEGATIVE
Nitrite: NEGATIVE
Protein, ur: NEGATIVE mg/dL
Specific Gravity, Urine: 1.02 (ref 1.005–1.030)
pH: 6 (ref 5.0–8.0)

## 2020-10-16 LAB — TYPE AND SCREEN
ABO/RH(D): O NEG
Antibody Screen: NEGATIVE

## 2020-10-16 LAB — PROTIME-INR
INR: 1 (ref 0.8–1.2)
Prothrombin Time: 12.9 seconds (ref 11.4–15.2)

## 2020-10-16 LAB — HEPARIN LEVEL (UNFRACTIONATED)
Heparin Unfractionated: 0.12 IU/mL — ABNORMAL LOW (ref 0.30–0.70)
Heparin Unfractionated: 0.15 IU/mL — ABNORMAL LOW (ref 0.30–0.70)

## 2020-10-16 LAB — APTT: aPTT: 34 seconds (ref 24–36)

## 2020-10-16 MED ORDER — AMLODIPINE BESYLATE 10 MG PO TABS
10.0000 mg | ORAL_TABLET | Freq: Every day | ORAL | Status: DC
  Administered 2020-10-17 – 2020-10-19 (×3): 10 mg via ORAL
  Filled 2020-10-16 (×3): qty 1

## 2020-10-16 MED ORDER — AMLODIPINE BESYLATE 5 MG PO TABS
5.0000 mg | ORAL_TABLET | Freq: Once | ORAL | Status: AC
  Administered 2020-10-16: 5 mg via ORAL
  Filled 2020-10-16: qty 1

## 2020-10-16 MED ORDER — SPIRONOLACTONE 25 MG PO TABS
25.0000 mg | ORAL_TABLET | Freq: Every day | ORAL | Status: DC
  Administered 2020-10-17 – 2020-10-19 (×3): 25 mg via ORAL
  Filled 2020-10-16 (×3): qty 1

## 2020-10-16 MED ORDER — CARVEDILOL 25 MG PO TABS
25.0000 mg | ORAL_TABLET | Freq: Two times a day (BID) | ORAL | Status: DC
  Administered 2020-10-16 – 2020-10-19 (×8): 25 mg via ORAL
  Filled 2020-10-16 (×8): qty 1

## 2020-10-16 MED ORDER — INSULIN GLARGINE 100 UNIT/ML ~~LOC~~ SOLN
20.0000 [IU] | Freq: Every day | SUBCUTANEOUS | Status: DC
  Administered 2020-10-16 – 2020-10-18 (×3): 20 [IU] via SUBCUTANEOUS
  Filled 2020-10-16 (×4): qty 0.2

## 2020-10-16 NOTE — Progress Notes (Signed)
Heart Failure Navigator Progress Note  Assessed for Heart & Vascular TOC clinic readiness.  Unfortunately at this time the patient does not meet criteria due to CVTS plan for CABG.   Navigator available for reassessment of patient.   Pricilla Holm, RN, BSN Heart Failure Nurse Navigator [phone]

## 2020-10-16 NOTE — Progress Notes (Signed)
[phone] Gave pt and wife OHS booklet, care guide and staying in the tube handout. Discussed sternal precautions and staying in the tube. Gave IS and pt demonstrated 2500 ml correctly. Discussed importance of IS and mobility after surgery. Wrote down how to view pre op video if interested. Pt's wife will be available to assist with care after surgery/discharge. Discussed smoking and tobacco cessation. Pt stated he is done with chewing and smoking. Took pt off oxygen and observed while educating. Sats stayed 98-99% on RA. Encouraged pt to take it easy at home. He stated he is going home today and returning Monday for surgery. Graylon Good RN BSN 10/16/2020 9:05 AM

## 2020-10-16 NOTE — Progress Notes (Signed)
ANTICOAGULATION CONSULT NOTE - Follow Up Consult  Pharmacy Consult for Heparin  Indication: chest pain/ACS  Patient Measurements: Height: 5' 9"  (175.3 cm) Weight: 95.8 kg (211 lb 4.8 oz) IBW/kg (Calculated) : 70.7  Heparin Dosing Wt: 90.4 kg  Vital Signs: Temp: 97.5 F (36.4 C) (04/28 0821) Temp Source: Oral (04/28 0821) BP: 136/82 (04/28 1621) Pulse Rate: 60 (04/28 1621)  Labs: Recent Labs    10/15/20 0005 10/15/20 0155 10/15/20 1142 10/16/20 0219 10/16/20 0841 10/16/20 1017 10/16/20 1620  HGB 15.2  --   --  14.8  --   --   --   HCT 45.9  --   --  45.0  --   --   --   PLT 202  --   --  197  --   --   --   APTT  --   --   --   --   --  34  --   LABPROT  --   --   --   --   --  12.9  --   INR  --   --   --   --   --  1.0  --   HEPARINUNFRC  --   --  0.10*  --  0.12*  --  0.15*  CREATININE 0.65 0.60*  --  0.62  --   --   --   TROPONINIHS 43* 63*  --   --   --   --   --     Estimated Creatinine Clearance: 107.9 mL/min (by C-G formula based on SCr of 0.62 mg/dL).  Assessment: Dwayne Garrett is a 65 y.o. male who presents to the ED with intermittent chest pain this past evening. Found to have mildly elevated troponin. Starting heparin per pharmacy. CBC/renal function good. PTA meds reviewed.   S/p cath lab with 3v CAD and w/u for CABG.   Heparin level this evening remains SUBtherapeutic and trended up only slightly despite a rate increase earlier today (HL 0.15 << 0.12, goal of 0.3-0.7). Of noting a new bag was hung right around the time of the lab drawn - the RN states the old bag never ran dry. No issues with the IV site or bleeding noted.   Goal of Therapy:  Heparin level 0.3-0.7 units/ml Monitor platelets by anticoagulation protocol: Yes   Plan:  - Increase Heparin to 1700 units/hr (17 ml/hr) - Will continue to monitor for any signs/symptoms of bleeding and will follow up with heparin level in 6 hours   Thank you for allowing pharmacy to be a part of this  patient's care.  Alycia Rossetti, PharmD, BCPS Clinical Pharmacist Clinical phone for 10/16/2020: [phone] 10/16/2020 6:09 PM   **Pharmacist phone directory can now be found on [email or web address] ([email or web address]).  Listed under [place].

## 2020-10-16 NOTE — Progress Notes (Signed)
Pre-CABG studies have been completed.  Results can be found under chart review under CV PROC. 10/16/2020 5:41 PM Konor Noren RVT, RDMS

## 2020-10-16 NOTE — Consult Note (Addendum)
[address]       [place] [address]             [phone]        Dwayne Garrett [place] Medical Record #[id number] Date of Birth: 12-07-1955  Referring: No ref. provider found Primary Care: [facility] Primary Cardiologist:Samuel Domenic Polite, MD  Chief Complaint:    Chief Complaint  Patient presents with  . Chest Pain    History of Present Illness:     65 year old male transferred from [facility] following an NSTEMI.  He underwent a left heart cath which showed severe three-vessel disease.  He does have a history of coronary artery disease and has undergone PCI with stent placement on 2 occasions the last of which was in 2018.  Patient presented to the emergency department due to pain that he was experiencing in his chest.  He has had several episodes over the past month.   Past Medical and Surgical History: Previous Chest Surgery: No Previous Chest Radiation: No Diabetes Mellitus: Yes.  HbA1C 12.4 Creatinine: 0.62  Past Medical History:  Diagnosis Date  . Abnormal LFTs    Cirrhosis per liver biopsy - follows with Dr. Laural Golden  . Bilateral leg edema    Chronic  . 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 Coronary atherosclerosis of native coronary artery    Multivessel (moderate), DES RCA 4/11, LVEF 60-65%. LHC 03/11/17: DES to dist RCA & 3rd left ventricular branch  . Essential hypertension, benign   . 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 OSA (obstructive sleep apnea)    on CPAP    Past Surgical History:  Procedure Laterality Date  . BONE CYST EXCISION     Cyst removal from neck  . CARDIAC CATHETERIZATION N/A 12/11/2014   Procedure: Left Heart Cath and Coronary Angiography;  Surgeon: Burnell Blanks, MD;  Location: [place] [facility];  Service: Cardiovascular;  Laterality: N/A;  . CORONARY STENT INTERVENTION N/A 03/11/2017   Procedure: CORONARY STENT INTERVENTION;  Surgeon: Belva Crome, MD;  Location: [place] [facility];  Service: Cardiovascular;  Laterality: N/A;  . ESOPHAGOGASTRODUODENOSCOPY (EGD) WITH PROPOFOL N/A 09/06/2019   Procedure: ESOPHAGOGASTRODUODENOSCOPY (EGD) WITH PROPOFOL;  Surgeon: Jonathon Bellows, MD;  Location: [facility] ENDOSCOPY;  Service: Gastroenterology;  Laterality: N/A;  . LEFT HEART CATH AND CORONARY ANGIOGRAPHY N/A 03/11/2017   Procedure: LEFT HEART CATH AND CORONARY ANGIOGRAPHY;  Surgeon: Belva Crome, MD;  Location: [place] [facility];  Service: Cardiovascular;  Laterality: N/A;  . LEFT HEART CATH AND CORONARY ANGIOGRAPHY N/A 10/15/2020   Procedure: LEFT HEART CATH AND CORONARY ANGIOGRAPHY;  Surgeon: Wellington Hampshire, MD;  Location: [place] [facility];  Service: Cardiovascular;  Laterality: N/A;  . SKIN GRAFT     Finger  . TUMOR REMOVAL     Abdominal  . ULTRASOUND GUIDANCE FOR VASCULAR ACCESS  03/11/2017   Procedure: Ultrasound Guidance For Vascular Access;  Surgeon: Belva Crome, MD;  Location: [place] [facility];  Service: Cardiovascular;;     Social History   Tobacco Use  Smoking Status Current Some Day Smoker  . Packs/day: 0.25  . Types: Cigarettes  . Last attempt to quit: 07/01/2004  . Years since quitting: 16.3  Smokeless Tobacco Current User  . 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 Nyquil Multi-Symptom [Pseudoeph-Doxylamine-Dm-Apap]     Feel jittery   . Tylenol [Acetaminophen]     Due to liver      Current Facility-Administered Medications  Medication Dose Route Frequency Provider Last Rate Last Admin  . 0.9 %  sodium chloride infusion  250 mL Intravenous PRN Wellington Hampshire, MD      . Derrill Memo ON 10/17/2020] amLODipine (NORVASC) tablet 10 mg  10 mg Oral Daily Kathyrn Drown D, NP      . aspirin chewable tablet 81 mg  81 mg Oral Daily Kathlyn Sacramento A, MD   81 mg at 10/16/20 0817  . carvedilol (COREG)  tablet 25 mg  25 mg Oral BID WC Kathyrn Drown D, NP   25 mg at 10/16/20 0958  . gabapentin (NEURONTIN) capsule 300 mg  300 mg Oral Daily Kathlyn Sacramento A, MD   300 mg at 10/16/20 0817  . heparin ADULT infusion 100 units/mL (25000 units/256m)  1,450 Units/hr Intravenous Continuous CSherren Mocha MD 14.5 mL/hr at 10/16/20 0958 1,450 Units/hr at 10/16/20 0958  . insulin aspart (novoLOG) injection 0-15 Units  0-15 Units Subcutaneous TID WC CSherren Mocha MD   15 Units at 10/16/20 [phone] . insulin aspart (novoLOG) injection 0-5 Units  0-5 Units Subcutaneous QHS CSherren Mocha MD   3 Units at 10/15/20 2145  . insulin glargine (LANTUS) injection 20 Units  20 Units Subcutaneous Daily MKathyrn DrownD, NP   20 Units at 10/16/20 1202  . lactulose (CHRONULAC) 10 GM/15ML solution 10 g  10 g Oral BID AKathlyn SacramentoA, MD   10 g at 10/16/20 0817  . losartan (COZAAR) tablet 100 mg  100 mg Oral Daily AKathlyn SacramentoA, MD   100 mg at 10/16/20 0817  . nitroGLYCERIN (NITROSTAT) SL tablet 0.4 mg  0.4 mg Sublingual Q5 min PRN AKathlyn SacramentoA, MD      . ondansetron (ZOFRAN) injection 4 mg  4 mg Intravenous Q6H PRN AKathlyn SacramentoA, MD      . pantoprazole (PROTONIX) EC tablet 40 mg  40 mg Oral Daily AWellington Hampshire MD   40 mg at 10/16/20 0817  . rosuvastatin (CRESTOR) tablet 20 mg  20 mg Oral Daily AKathlyn SacramentoA, MD   20 mg at 10/16/20 0817  . sodium chloride flush (NS) 0.9 % injection 3 mL  3 mL Intravenous Q12H AWellington Hampshire MD   3 mL at 10/15/20 2136  . sodium chloride flush (NS) 0.9 % injection 3 mL  3 mL Intravenous PRN AWellington Hampshire MD      . [Derrill MemoON 10/17/2020] spironolactone (ALDACTONE) tablet 25 mg  25 mg Oral Daily MKathyrn DrownD, NP        Medications Prior to Admission  Medication Sig Dispense Refill Last Dose  . metFORMIN (GLUCOPHAGE) 1000 MG tablet Take 1,000 mg by mouth 2 (two) times daily with a meal.  3 10/14/2020 at Unknown time  . amLODipine (NORVASC) 5 MG tablet Take  1 tablet (5 mg total) by mouth 2 (two) times daily. 60 tablet 2   . aspirin 81 MG chewable tablet Chew 1 tablet (81 mg total) by mouth daily. 30 tablet 11   . atorvastatin (LIPITOR) 40 MG tablet Take 1 tablet (40 mg total) by mouth daily. 90 tablet 1   . carvedilol (COREG) 25 MG tablet Take 25 mg by mouth 2 (two) times daily with a meal.     .  cyclobenzaprine (FLEXERIL) 10 MG tablet take 1 tablet by oral route 3 times every day as needed for muscle spasms     . docusate sodium (COLACE) 100 MG capsule Take by mouth.     . gabapentin (NEURONTIN) 300 MG capsule Take by mouth.     . hydrochlorothiazide (HYDRODIURIL) 25 MG tablet Take by mouth.     . lactulose (CHRONULAC) 10 GM/15ML solution Take 10 g by mouth 2 (two) times daily.     Marland Kitchen lidocaine (LIDODERM) 5 % Place 1 patch onto the skin daily. Remove & Discard patch within 12 hours or as directed by MD 30 patch 0   . losartan (COZAAR) 100 MG tablet Take 100 mg by mouth daily.     . meloxicam (MOBIC) 7.5 MG tablet Take by mouth.     . Multiple Vitamin (MULTIVITAMIN WITH MINERALS) TABS tablet Take 1 tablet by mouth daily.     Marland Kitchen NITROSTAT 0.4 MG SL tablet PLACE 1 TABLET UNDER TONGUE EVERY FIVE MINUTES AS DIRECTED 25 tablet 3   . Omega-3 Fatty Acids (FISH OIL) 1000 MG CAPS Take 3,000 mg by mouth daily.     Marland Kitchen omeprazole (PRILOSEC) 40 MG capsule TAKE 1 CAPSULE (40 MG TOTAL) BY MOUTH DAILY. 90 capsule 3   . ONETOUCH VERIO test strip TEST DAILY E11.9  3   . oxyCODONE (OXY IR/ROXICODONE) 5 MG immediate release tablet Take by mouth.     . oxyCODONE (ROXICODONE) 5 MG immediate release tablet Take 1 tablet (5 mg total) by mouth every 4 (four) hours as needed for severe pain. 30 tablet 0   . sildenafil (VIAGRA) 100 MG tablet Take 100 mg by mouth daily as needed for erectile dysfunction.     . ticagrelor (BRILINTA) 60 MG TABS tablet Take 1 tablet (60 mg total) by mouth 2 (two) times daily. (Patient not taking: Reported on 07/18/2019) 180 tablet 3     Family  History  Problem Relation Age of Onset  . Heart attack Brother 39     Review of Systems:   Review of Systems  Constitutional: Negative.   Respiratory: Positive for shortness of breath.   Cardiovascular: Positive for chest pain.  Musculoskeletal: Negative.   Neurological: Negative.       Physical Exam: BP (!) 164/87   Pulse 70   Temp (!) 97.5 F (36.4 C) (Oral)   Resp 17   Ht 5' 9"  (1.753 m)   Wt 95.8 kg   SpO2 98%   BMI 31.20 kg/m  Physical Exam Constitutional:      General: He is not in acute distress.    Appearance: He is well-developed and normal weight. He is not ill-appearing.  HENT:     Head: Normocephalic and atraumatic.  Pulmonary:     Effort: Pulmonary effort is normal.  Abdominal:     Palpations: Abdomen is soft.  Musculoskeletal:        General: Normal range of motion.  Skin:    General: Skin is warm and dry.  Neurological:     General: No focal deficit present.     Mental Status: He is alert and oriented to person, place, and time.       Diagnostic Studies & Laboratory data:    Left Heart Catherization:  Prox Cx lesion is 30% stenosed.  Previously placed Mid RCA to Dist RCA drug eluting stent is widely patent.  Previously placed 3rd RPL drug eluting stent is widely patent.  Prox RCA lesion is 95% stenosed.  RPAV lesion is 99% stenosed.  RPDA lesion is 70% stenosed.  1st RPL lesion is 80% stenosed.  RV Branch lesion is 80% stenosed.  Mid LM lesion is 60% stenosed.  2nd Mrg lesion is 80% stenosed.  Prox LAD to Mid LAD lesion is 60% stenosed.  Previously placed 1st Diag-1 stent (unknown type) is widely patent.  1st Diag-2 lesion is 80% stenosed.   Echo:  1. Left ventricular ejection fraction, by estimation, is 60 to 65%. The  left ventricle has normal function. The left ventricle has no regional  wall motion abnormalities. There is moderate left ventricular hypertrophy.  Left ventricular diastolic  parameters are  indeterminate.  2. Right ventricular systolic function is normal. The right ventricular  size is normal. There is mildly elevated pulmonary artery systolic  pressure. The estimated right ventricular systolic pressure is 70.2 mmHg.  3. The mitral valve is normal in structure. Trivial mitral valve  regurgitation.  4. The aortic valve is tricuspid. Aortic valve regurgitation is not  visualized. No aortic stenosis is present.  5. The inferior vena cava is normal in size with greater than 50%  respiratory variability, suggesting right atrial pressure of 3 mmHg.    I have independently reviewed the above radiologic studies and discussed with the patient   Recent Lab Findings:     Assessment / Plan:   65 year old male with significant three-vessel coronary artery disease.  His RV and LV function about the preserved, and he has no significant valvular disease.  The risks and benefits of surgical revascularization in the discussion is agreeable to proceed.  Earliest available date will be Monday, May 2.     I  spent 40 minutes counseling the patient face to face.   Lajuana Matte 10/16/2020 12:06 PM

## 2020-10-16 NOTE — Progress Notes (Signed)
ANTICOAGULATION CONSULT NOTE - Follow Up Consult  Pharmacy Consult for Heparin  Indication: chest pain/ACS  Allergies  Allergen Reactions  . Nyquil Multi-Symptom [Pseudoeph-Doxylamine-Dm-Apap]     Feel jittery   . Tylenol [Acetaminophen]     Due to liver    Patient Measurements: Height: 5' 9"  (175.3 cm) Weight: 95.8 kg (211 lb 4.8 oz) IBW/kg (Calculated) : 70.7  Vital Signs: Temp: 97.5 F (36.4 C) (04/28 0821) Temp Source: Oral (04/28 0821) BP: 189/98 (04/28 0821) Pulse Rate: 75 (04/28 0821)  Labs: Recent Labs    10/15/20 0005 10/15/20 0155 10/15/20 1142 10/16/20 0219 10/16/20 0841  HGB 15.2  --   --  14.8  --   HCT 45.9  --   --  45.0  --   PLT 202  --   --  197  --   HEPARINUNFRC  --   --  0.10*  --  0.12*  CREATININE 0.65 0.60*  --  0.62  --   TROPONINIHS 43* 63*  --   --   --     Estimated Creatinine Clearance: 107.9 mL/min (by C-G formula based on SCr of 0.62 mg/dL).   Medical History: Past Medical History:  Diagnosis Date  . Abnormal LFTs    Cirrhosis per liver biopsy - follows with Dr. Laural Golden  . Bilateral leg edema    Chronic  . 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 Coronary atherosclerosis of native coronary artery    Multivessel (moderate), DES RCA 4/11, LVEF 60-65%. LHC 03/11/17: DES to dist RCA & 3rd left ventricular branch  . Essential hypertension, benign   . Inferior myocardial infarction ([place])    09/2009  . OSA (obstructive sleep apnea)    on CPAP    Assessment: Dwayne Garrett is a 65 y.o. male who presents to the ED with intermittent chest pain this past evening. Found to have mildly elevated troponin. Starting heparin per pharmacy. CBC/renal function good. PTA meds reviewed.   S/p cath lab with 3v CAD and w/u for CABG. Heparin level came back low at 0.12, on heparin at 1200 units/hr. CBC stable - no s/sx of bleeding or infusion issues.   Goal of Therapy:  Heparin level 0.3-0.7 units/ml Monitor platelets by anticoagulation protocol: Yes    Plan:  Increase heparin infusion at 1450 units/hr Order heparin level in 6 hours Daily CBC/Heparin level Monitor for bleeding  Antonietta Jewel, PharmD, [place] Pharmacist  Phone: [phone] 10/16/2020 9:50 AM  Please check AMION for all [place] phone numbers After 10:00 PM, call [place] [phone]

## 2020-10-16 NOTE — Progress Notes (Signed)
Inpatient Diabetes Program Recommendations  AACE/ADA: New Consensus Statement on Inpatient Glycemic Control (2015)  Target Ranges:  Prepandial:   less than 140 mg/dL      Peak postprandial:   less than 180 mg/dL (1-2 hours)      Critically ill patients:  140 - 180 mg/dL   Lab Results  Component Value Date   GLUCAP 359 (H) 10/16/2020   HGBA1C 12.4 (H) 10/15/2020    Review of Glycemic Control Results for Dwayne Garrett, Dwayne Garrett (MRN [id number]) as of 10/16/2020 10:09  Ref. Range 10/15/2020 08:19 10/15/2020 11:55 10/15/2020 16:34 10/15/2020 21:26 10/16/2020 07:47  Glucose-Capillary Latest Ref Range: 70 - 99 mg/dL 187 (H) 179 (H) 209 (H) 271 (H) 359 (H)   Inpatient Diabetes Program Recommendations:   -Lantus 20 units qd (0.2 units/kg x 95.8 kg)   Thank you, Bethena Roys E. Aryona Sill, RN, MSN, CDE  Diabetes Coordinator Inpatient Glycemic Control Team Team Pager [phone] (8am-5pm) 10/16/2020 10:12 AM

## 2020-10-16 NOTE — Progress Notes (Signed)
Ambulated pt from room, down hallway and back. Tolerated well with no reports of chest pain and no shortness of breath.   Dwayne Garrett  10/16/20 0945

## 2020-10-17 DIAGNOSIS — E1165 Type 2 diabetes mellitus with hyperglycemia: Secondary | ICD-10-CM | POA: Diagnosis not present

## 2020-10-17 DIAGNOSIS — K219 Gastro-esophageal reflux disease without esophagitis: Secondary | ICD-10-CM | POA: Diagnosis not present

## 2020-10-17 DIAGNOSIS — Z886 Allergy status to analgesic agent status: Secondary | ICD-10-CM | POA: Diagnosis not present

## 2020-10-17 DIAGNOSIS — K921 Melena: Secondary | ICD-10-CM | POA: Diagnosis not present

## 2020-10-17 DIAGNOSIS — Z7984 Long term (current) use of oral hypoglycemic drugs: Secondary | ICD-10-CM | POA: Diagnosis not present

## 2020-10-17 DIAGNOSIS — I214 Non-ST elevation (NSTEMI) myocardial infarction: Secondary | ICD-10-CM | POA: Diagnosis not present

## 2020-10-17 DIAGNOSIS — Z888 Allergy status to other drugs, medicaments and biological substances status: Secondary | ICD-10-CM | POA: Diagnosis not present

## 2020-10-17 DIAGNOSIS — Z7982 Long term (current) use of aspirin: Secondary | ICD-10-CM | POA: Diagnosis not present

## 2020-10-17 DIAGNOSIS — Z8249 Family history of ischemic heart disease and other diseases of the circulatory system: Secondary | ICD-10-CM | POA: Diagnosis not present

## 2020-10-17 DIAGNOSIS — E8770 Fluid overload, unspecified: Secondary | ICD-10-CM | POA: Diagnosis not present

## 2020-10-17 DIAGNOSIS — G4733 Obstructive sleep apnea (adult) (pediatric): Secondary | ICD-10-CM | POA: Diagnosis not present

## 2020-10-17 DIAGNOSIS — D47Z2 Castleman disease: Secondary | ICD-10-CM | POA: Diagnosis not present

## 2020-10-17 DIAGNOSIS — I10 Essential (primary) hypertension: Secondary | ICD-10-CM | POA: Diagnosis not present

## 2020-10-17 DIAGNOSIS — I252 Old myocardial infarction: Secondary | ICD-10-CM | POA: Diagnosis not present

## 2020-10-17 DIAGNOSIS — D62 Acute posthemorrhagic anemia: Secondary | ICD-10-CM | POA: Diagnosis not present

## 2020-10-17 DIAGNOSIS — F17210 Nicotine dependence, cigarettes, uncomplicated: Secondary | ICD-10-CM | POA: Diagnosis present

## 2020-10-17 DIAGNOSIS — Z79899 Other long term (current) drug therapy: Secondary | ICD-10-CM | POA: Diagnosis not present

## 2020-10-17 DIAGNOSIS — I4891 Unspecified atrial fibrillation: Secondary | ICD-10-CM | POA: Diagnosis not present

## 2020-10-17 DIAGNOSIS — Z7902 Long term (current) use of antithrombotics/antiplatelets: Secondary | ICD-10-CM | POA: Diagnosis not present

## 2020-10-17 DIAGNOSIS — Z20822 Contact with and (suspected) exposure to covid-19: Secondary | ICD-10-CM | POA: Diagnosis not present

## 2020-10-17 DIAGNOSIS — Z955 Presence of coronary angioplasty implant and graft: Secondary | ICD-10-CM | POA: Diagnosis not present

## 2020-10-17 DIAGNOSIS — E782 Mixed hyperlipidemia: Secondary | ICD-10-CM | POA: Diagnosis present

## 2020-10-17 DIAGNOSIS — I25110 Atherosclerotic heart disease of native coronary artery with unstable angina pectoris: Secondary | ICD-10-CM | POA: Diagnosis not present

## 2020-10-17 DIAGNOSIS — K7460 Unspecified cirrhosis of liver: Secondary | ICD-10-CM | POA: Diagnosis not present

## 2020-10-17 LAB — BASIC METABOLIC PANEL
Anion gap: 7 (ref 5–15)
BUN: 13 mg/dL (ref 8–23)
CO2: 26 mmol/L (ref 22–32)
Calcium: 8.9 mg/dL (ref 8.9–10.3)
Chloride: 100 mmol/L (ref 98–111)
Creatinine, Ser: 0.63 mg/dL (ref 0.61–1.24)
GFR, Estimated: >60 mL/min (ref >60)
Glucose, Bld: 297 mg/dL — ABNORMAL HIGH (ref 70–99)
Potassium: 3.5 mmol/L (ref 3.5–5.1)
Sodium: 133 mmol/L — ABNORMAL LOW (ref 135–145)

## 2020-10-17 LAB — CBC
HCT: 42.3 % (ref 39.0–52.0)
Hemoglobin: 14.4 g/dL (ref 13.0–17.0)
MCH: 30.7 pg (ref 26.0–34.0)
MCHC: 34 g/dL (ref 30.0–36.0)
MCV: 90.2 fL (ref 80.0–100.0)
Platelets: 187 10*3/uL (ref 150–400)
RBC: 4.69 MIL/uL (ref 4.22–5.81)
RDW: 12.9 % (ref 11.5–15.5)
WBC: 7.3 10*3/uL (ref 4.0–10.5)
nRBC: 0 % (ref 0.0–0.2)

## 2020-10-17 LAB — HEPARIN LEVEL (UNFRACTIONATED)
Heparin Unfractionated: 0.32 IU/mL (ref 0.30–0.70)
Heparin Unfractionated: 0.37 IU/mL (ref 0.30–0.70)

## 2020-10-17 LAB — GLUCOSE, CAPILLARY
Glucose-Capillary: 281 mg/dL — ABNORMAL HIGH (ref 70–99)
Glucose-Capillary: 241 mg/dL — ABNORMAL HIGH (ref 70–99)
Glucose-Capillary: 189 mg/dL — ABNORMAL HIGH (ref 70–99)
Glucose-Capillary: 305 mg/dL — ABNORMAL HIGH (ref 70–99)

## 2020-10-17 MED ORDER — INSULIN ASPART 100 UNIT/ML IJ SOLN
0.0000 [IU] | Freq: Three times a day (TID) | INTRAMUSCULAR | Status: DC
  Administered 2020-10-17: 11 [IU] via SUBCUTANEOUS
  Administered 2020-10-17: 4 [IU] via SUBCUTANEOUS
  Administered 2020-10-17: 7 [IU] via SUBCUTANEOUS
  Administered 2020-10-18: 4 [IU] via SUBCUTANEOUS
  Administered 2020-10-18 (×2): 7 [IU] via SUBCUTANEOUS
  Administered 2020-10-19 (×2): 11 [IU] via SUBCUTANEOUS
  Administered 2020-10-19: 7 [IU] via SUBCUTANEOUS

## 2020-10-17 MED ORDER — TRANEXAMIC ACID 1000 MG/10ML IV SOLN
1.5000 mg/kg/h | INTRAVENOUS | Status: DC
  Filled 2020-10-17: qty 25

## 2020-10-17 MED ORDER — INSULIN ASPART 100 UNIT/ML IJ SOLN: 0.0000 [IU] | Freq: Three times a day (TID) | INTRAMUSCULAR | Status: DC

## 2020-10-17 MED ORDER — POTASSIUM CHLORIDE 2 MEQ/ML IV SOLN
80.0000 meq | INTRAVENOUS | Status: DC
  Filled 2020-10-17: qty 40

## 2020-10-17 MED ORDER — PHENYLEPHRINE HCL-NACL 20-0.9 MG/250ML-% IV SOLN
30.0000 ug/min | INTRAVENOUS | Status: DC
  Filled 2020-10-17: qty 250

## 2020-10-17 MED ORDER — SODIUM CHLORIDE 0.9 % IV SOLN
INTRAVENOUS | Status: DC
  Filled 2020-10-17: qty 30

## 2020-10-17 MED ORDER — TRANEXAMIC ACID (OHS) PUMP PRIME SOLUTION
2.0000 mg/kg | INTRAVENOUS | Status: DC
  Filled 2020-10-17: qty 1.93

## 2020-10-17 MED ORDER — SODIUM CHLORIDE 0.9 % IV SOLN
750.0000 mg | INTRAVENOUS | Status: DC
  Filled 2020-10-17: qty 750

## 2020-10-17 MED ORDER — SODIUM CHLORIDE 0.9 % IV SOLN
1.5000 g | INTRAVENOUS | Status: DC
  Filled 2020-10-17: qty 1.5

## 2020-10-17 MED ORDER — NOREPINEPHRINE 4 MG/250ML-% IV SOLN
0.0000 ug/min | INTRAVENOUS | Status: DC
  Filled 2020-10-17: qty 250

## 2020-10-17 MED ORDER — TRANEXAMIC ACID (OHS) BOLUS VIA INFUSION
15.0000 mg/kg | INTRAVENOUS | Status: DC
  Filled 2020-10-17: qty 1445

## 2020-10-17 MED ORDER — DEXMEDETOMIDINE HCL IN NACL 400 MCG/100ML IV SOLN
0.1000 ug/kg/h | INTRAVENOUS | Status: DC
  Filled 2020-10-17: qty 100

## 2020-10-17 MED ORDER — MANNITOL 20 % IV SOLN
INTRAVENOUS | Status: DC
  Filled 2020-10-17: qty 13

## 2020-10-17 MED ORDER — INSULIN ASPART 100 UNIT/ML IJ SOLN
0.0000 [IU] | Freq: Every day | INTRAMUSCULAR | Status: DC
  Administered 2020-10-17: 4 [IU] via SUBCUTANEOUS
  Administered 2020-10-18 – 2020-10-19 (×2): 2 [IU] via SUBCUTANEOUS

## 2020-10-17 MED ORDER — VANCOMYCIN HCL 1500 MG/300ML IV SOLN
1500.0000 mg | INTRAVENOUS | Status: DC
  Filled 2020-10-17 (×2): qty 300

## 2020-10-17 MED ORDER — NITROGLYCERIN IN D5W 200-5 MCG/ML-% IV SOLN
2.0000 ug/min | INTRAVENOUS | Status: DC
  Filled 2020-10-17: qty 250

## 2020-10-17 MED ORDER — MILRINONE LACTATE IN DEXTROSE 20-5 MG/100ML-% IV SOLN
0.3000 ug/kg/min | INTRAVENOUS | Status: DC
  Filled 2020-10-17: qty 100

## 2020-10-17 MED ORDER — INSULIN REGULAR(HUMAN) IN NACL 100-0.9 UT/100ML-% IV SOLN
INTRAVENOUS | Status: DC
  Filled 2020-10-17: qty 100

## 2020-10-17 MED ORDER — PLASMA-LYTE 148 IV SOLN
INTRAVENOUS | Status: DC
  Filled 2020-10-17: qty 2.5

## 2020-10-17 MED ORDER — EPINEPHRINE HCL 5 MG/250ML IV SOLN IN NS
0.0000 ug/min | INTRAVENOUS | Status: DC
Start: 2020-10-20 — End: 2020-10-20
  Filled 2020-10-17: qty 250

## 2020-10-17 NOTE — Progress Notes (Signed)
ANTICOAGULATION CONSULT NOTE - Follow Up Consult  Pharmacy Consult for Heparin  Indication: chest pain/ACS  Patient Measurements: Height: 5' 9"  (175.3 cm) Weight: 96.3 kg (212 lb 4.8 oz) IBW/kg (Calculated) : 70.7  Heparin Dosing Wt: 90.4 kg  Vital Signs: Temp: 97.7 F (36.5 C) (04/29 0351) Temp Source: Oral (04/29 0351) BP: 166/102 (04/29 0815) Pulse Rate: 56 (04/29 0351)  Labs: Recent Labs    10/15/20 0005 10/15/20 0155 10/15/20 1142 10/16/20 0219 10/16/20 0841 10/16/20 1017 10/16/20 1620 10/17/20 0049 10/17/20 0744  HGB 15.2  --   --  14.8  --   --   --  14.4  --   HCT 45.9  --   --  45.0  --   --   --  42.3  --   PLT 202  --   --  197  --   --   --  187  --   APTT  --   --   --   --   --  34  --   --   --   LABPROT  --   --   --   --   --  12.9  --   --   --   INR  --   --   --   --   --  1.0  --   --   --   HEPARINUNFRC  --   --    < >  --    < >  --  0.15* 0.32 0.37  CREATININE 0.65 0.60*  --  0.62  --   --   --  0.63  --   TROPONINIHS 43* 63*  --   --   --   --   --   --   --    < > = values in this interval not displayed.    Estimated Creatinine Clearance: 108.1 mL/min (by C-G formula based on SCr of 0.63 mg/dL).  Assessment: Dwayne Garrett is a 65 y.o. male who presents to the ED with intermittent chest pain this past evening. Found to have mildly elevated troponin. Starting heparin per pharmacy. CBC/renal function good. PTA meds reviewed.   S/p cath lab with 3v CAD and w/u for CABG - earliest available date is Monday, May 2.   Heparin level is therapeutic (0.37) on gtt at 1700 units/hr. No s/sx of bleeding or infusion issues.   Goal of Therapy:  Heparin level 0.3-0.7 units/ml Monitor platelets by anticoagulation protocol: Yes   Plan:  Continue heparin at 1700 units/hr  Monitor daily HL, CBC, and for s/sx of bleeding   Antonietta Jewel, PharmD, [place] Pharmacist  Phone: [phone] 10/17/2020 9:22 AM  Please check AMION for all [place]  phone numbers After 10:00 PM, call [place] [phone]

## 2020-10-17 NOTE — Progress Notes (Signed)
Progress Note  Patient Name: Dwayne Garrett Date of Encounter: 10/17/2020  Primary Cardiologist: Rozann Lesches, MD  Subjective   Doing okay today, no chest pain or shortness of breath.  Has not been out of bed this morning yet.  Inpatient Medications    Scheduled Meds: . amLODipine  10 mg Oral Daily  . aspirin  81 mg Oral Daily  . carvedilol  25 mg Oral BID WC  . gabapentin  300 mg Oral Daily  . insulin aspart  0-15 Units Subcutaneous TID WC  . insulin aspart  0-5 Units Subcutaneous QHS  . insulin glargine  20 Units Subcutaneous Daily  . lactulose  10 g Oral BID  . losartan  100 mg Oral Daily  . pantoprazole  40 mg Oral Daily  . rosuvastatin  20 mg Oral Daily  . sodium chloride flush  3 mL Intravenous Q12H  . spironolactone  25 mg Oral Daily   Continuous Infusions: . sodium chloride    . heparin 1,700 Units/hr (10/17/20 0711)   PRN Meds: sodium chloride, nitroGLYCERIN, ondansetron (ZOFRAN) IV, sodium chloride flush   Vital Signs    Vitals:   10/16/20 0958 10/16/20 1621 10/16/20 2058 10/17/20 0351  BP:  136/82 132/86 (!) 152/97  Pulse: 70 60 (!) 54 (!) 56  Resp:   18 18  Temp:   97.8 F (36.6 C) 97.7 F (36.5 C)  TempSrc:   Oral Oral  SpO2:    97%  Weight:    96.3 kg  Height:        Intake/Output Summary (Last 24 hours) at 10/17/2020 0730 Last data filed at 10/16/2020 1700 Gross per 24 hour  Intake 1211.93 ml  Output 1425 ml  Net -213.07 ml   Filed Weights   10/15/20 0003 10/16/20 0509 10/17/20 0351  Weight: 95.3 kg 95.8 kg 96.3 kg    Physical Exam   General: Well developed, well nourished, NAD Neck: Negative for carotid bruits. No JVD Lungs:Clear to ausculation bilaterally. No wheezes, rales, or rhonchi. Breathing is unlabored. Cardiovascular: RRR with S1 S2. No murmurs Abdomen: Soft, non-tender, non-distended. No obvious abdominal masses. Extremities: No edema. Radial pulses 2+ bilaterally Neuro: Alert and oriented. No focal deficits.  No facial asymmetry. MAE spontaneously. Psych: Responds to questions appropriately with normal affect.    Labs    Chemistry Recent Labs  Lab 10/15/20 0155 10/15/20 1142 10/16/20 0219 10/17/20 0049  NA 132*  --  136 133*  K 3.9  --  3.7 3.5  CL 98  --  103 100  CO2 23  --  23 26  GLUCOSE 433*  --  280* 297*  BUN 16  --  9 13  CREATININE 0.60*  --  0.62 0.63  CALCIUM 9.5  --  9.0 8.9  PROT  --  6.7  --   --   ALBUMIN  --  3.4*  --   --   AST  --  98*  --   --   ALT  --  109*  --   --   ALKPHOS  --  60  --   --   BILITOT  --  1.4*  --   --   GFRNONAA >60  --  >60 >60  ANIONGAP 11  --  10 7     Hematology Recent Labs  Lab 10/15/20 0005 10/16/20 0219 10/17/20 0049  WBC 7.5 7.3 7.3  RBC 5.02 4.88 4.69  HGB 15.2 14.8 14.4  HCT  45.9 45.0 42.3  MCV 91.4 92.2 90.2  MCH 30.3 30.3 30.7  MCHC 33.1 32.9 34.0  RDW 12.9 13.2 12.9  PLT 202 197 187    Cardiac EnzymesNo results for input(s): TROPONINI in the last 168 hours. No results for input(s): TROPIPOC in the last 168 hours.   BNPNo results for input(s): BNP, PROBNP in the last 168 hours.   DDimer No results for input(s): DDIMER in the last 168 hours.   Radiology    CARDIAC CATHETERIZATION  Result Date: 10/15/2020  Prox Cx lesion is 30% stenosed.  Previously placed Mid RCA to Dist RCA drug eluting stent is widely patent.  Previously placed 3rd RPL drug eluting stent is widely patent.  Prox RCA lesion is 95% stenosed.  RPAV lesion is 99% stenosed.  RPDA lesion is 70% stenosed.  1st RPL lesion is 80% stenosed.  RV Branch lesion is 80% stenosed.  Mid LM lesion is 60% stenosed.  2nd Mrg lesion is 80% stenosed.  Prox LAD to Mid LAD lesion is 60% stenosed.  Previously placed 1st Diag-1 stent (unknown type) is widely patent.  1st Diag-2 lesion is 80% stenosed.  1.  Significant left main and three-vessel coronary artery disease. 2.  Left ventricular angiography was not performed.  Ejection fraction appeared normal by  echo.  Mildly elevated left ventricular end-diastolic pressure. Recommendations: Recommend evaluation for CABG.  Resume heparin 8 hours after sheath pull.   ECHOCARDIOGRAM COMPLETE  Result Date: 10/15/2020    ECHOCARDIOGRAM REPORT   Patient Name:   Dwayne Garrett Date of Exam: 10/15/2020 Medical Rec #:  [id number]        Height:       69.0 in Accession #:    [id number]       Weight:       210.0 lb Date of Birth:  September 10, 1955        BSA:          2.109 m Patient Age:    65 years         BP:           137/84 mmHg Patient Gender: M                HR:           62 bpm. Exam Location:  Inpatient Procedure: 2D Echo, Cardiac Doppler and Color Doppler Indications:    Chest pain  History:        Patient has no prior history of Echocardiogram examinations.                 Previous Myocardial Infarction; Risk Factors:Hypertension.  Sonographer:    [place] Referring Phys: [id number] Darreld Mclean  Sonographer Comments: 12/11/14, 03/11/17, and 10/15/20 cath IMPRESSIONS  1. Left ventricular ejection fraction, by estimation, is 60 to 65%. The left ventricle has normal function. The left ventricle has no regional wall motion abnormalities. There is moderate left ventricular hypertrophy. Left ventricular diastolic parameters are indeterminate.  2. Right ventricular systolic function is normal. The right ventricular size is normal. There is mildly elevated pulmonary artery systolic pressure. The estimated right ventricular systolic pressure is 99.3 mmHg.  3. The mitral valve is normal in structure. Trivial mitral valve regurgitation.  4. The aortic valve is tricuspid. Aortic valve regurgitation is not visualized. No aortic stenosis is present.  5. The inferior vena cava is normal in size with greater than 50% respiratory variability, suggesting right atrial pressure of 3 mmHg. FINDINGS  Left Ventricle: Left ventricular ejection fraction, by estimation, is 60 to 65%. The left ventricle has normal function. The left  ventricle has no regional wall motion abnormalities. The left ventricular internal cavity size was normal in size. There is  moderate left ventricular hypertrophy. Left ventricular diastolic parameters are indeterminate. Right Ventricle: The right ventricular size is normal. No increase in right ventricular wall thickness. Right ventricular systolic function is normal. There is mildly elevated pulmonary artery systolic pressure. The tricuspid regurgitant velocity is 2.87  m/s, and with an assumed right atrial pressure of 3 mmHg, the estimated right ventricular systolic pressure is 22.2 mmHg. Left Atrium: Left atrial size was normal in size. Right Atrium: Right atrial size was normal in size. Pericardium: There is no evidence of pericardial effusion. Mitral Valve: The mitral valve is normal in structure. Trivial mitral valve regurgitation. Tricuspid Valve: The tricuspid valve is normal in structure. Tricuspid valve regurgitation is trivial. Aortic Valve: The aortic valve is tricuspid. Aortic valve regurgitation is not visualized. No aortic stenosis is present. Aortic valve mean gradient measures 4.0 mmHg. Aortic valve peak gradient measures 7.1 mmHg. Aortic valve area, by VTI measures 2.92 cm. Pulmonic Valve: The pulmonic valve was grossly normal. Pulmonic valve regurgitation is not visualized. Aorta: The aortic root and ascending aorta are structurally normal, with no evidence of dilitation. Venous: The inferior vena cava is normal in size with greater than 50% respiratory variability, suggesting right atrial pressure of 3 mmHg. IAS/Shunts: The interatrial septum was not well visualized.  LEFT VENTRICLE PLAX 2D LVIDd:         4.60 cm     Diastology LVIDs:         2.60 cm     LV e' medial:    5.77 cm/s LV PW:         1.50 cm     LV E/e' medial:  14.4 LV IVS:        1.30 cm     LV e' lateral:   8.70 cm/s LVOT diam:     1.90 cm     LV E/e' lateral: 9.6 LV SV:         74 LV SV Index:   35 LVOT Area:     2.84 cm  LV  Volumes (MOD) LV vol d, MOD A2C: 55.7 ml LV vol d, MOD A4C: 87.4 ml LV vol s, MOD A2C: 31.0 ml LV vol s, MOD A4C: 25.9 ml LV SV MOD A2C:     24.7 ml LV SV MOD A4C:     87.4 ml LV SV MOD BP:      44.4 ml RIGHT VENTRICLE RV S prime:     13.10 cm/s TAPSE (M-mode): 1.6 cm LEFT ATRIUM             Index       RIGHT ATRIUM           Index LA diam:        4.00 cm 1.90 cm/m  RA Area:     14.30 cm LA Vol (A2C):   42.1 ml 19.96 ml/m RA Volume:   33.30 ml  15.79 ml/m LA Vol (A4C):   44.6 ml 21.14 ml/m LA Biplane Vol: 46.1 ml 21.86 ml/m  AORTIC VALVE                    PULMONIC VALVE AV Area (Vmax):    3.04 cm     PV Vmax:  0.87 m/s AV Area (Vmean):   3.21 cm     PV Vmean:      58.400 cm/s AV Area (VTI):     2.92 cm     PV VTI:        0.205 m AV Vmax:           133.50 cm/s  PV Peak grad:  3.0 mmHg AV Vmean:          92.000 cm/s  PV Mean grad:  2.0 mmHg AV VTI:            0.254 m AV Peak Grad:      7.1 mmHg AV Mean Grad:      4.0 mmHg LVOT Vmax:         143.00 cm/s LVOT Vmean:        104.000 cm/s LVOT VTI:          0.262 m LVOT/AV VTI ratio: 1.03  AORTA Ao Root diam: 3.20 cm Ao Asc diam:  3.50 cm MITRAL VALVE               TRICUSPID VALVE MV Area (PHT): 3.60 cm    TR Peak grad:   32.9 mmHg MV Decel Time: 211 msec    TR Vmax:        287.00 cm/s MR Peak grad: 86.9 mmHg MR Mean grad: 57.0 mmHg    SHUNTS MR Vmax:      466.00 cm/s  Systemic VTI:  0.26 m MR Vmean:     359.0 cm/s   Systemic Diam: 1.90 cm MV E velocity: 83.10 cm/s MV A velocity: 63.40 cm/s MV E/A ratio:  1.31 Oswaldo Milian MD Electronically signed by Oswaldo Milian MD Signature Date/Time: 10/15/2020/6:27:03 PM    Final    VAS US DOPPLER PRE CABG  Result Date: 10/16/2020 PREOPERATIVE VASCULAR EVALUATION Patient Name:  HUE FRICK  Date of Exam:   10/16/2020 Medical Rec #: [id number]         Accession #:    [id number] Date of Birth: 1955-07-20         Patient Gender: M Patient Age:   65Y Exam Location:  [facility] Procedure:       VAS US DOPPLER PRE CABG Referring Phys: [id number] HARRELL O LIGHTFOOT --------------------------------------------------------------------------------  Indications:            Pre-CABG. Risk Factors:           Hypertension, current smoker, prior MI, coronary artery                         disease. Vascular Interventions: PCI w/ stent placement. Performing Technologist: Rogelia Rohrer RVT/RDMS Supporting Technologist: Darlin Coco RDMS,RVT  Examination Guidelines: A complete evaluation includes B-mode imaging, spectral Doppler, color Doppler, and power Doppler as needed of all accessible portions of each vessel. Bilateral testing is considered an integral part of a complete examination. Limited examinations for reoccurring indications may be performed as noted.  Right Carotid Findings: +----------+--------+-------+--------+----------------------+------------------+           PSV cm/sEDV    StenosisDescribe              Comments                             cm/s                                                    +----------+--------+-------+--------+----------------------+------------------+  CCA Prox  90      20                                   intimal thickening +----------+--------+-------+--------+----------------------+------------------+ CCA Distal80      17             heterogenous          intimal thickening +----------+--------+-------+--------+----------------------+------------------+ ICA Prox  102     27             hyperechoic                              +----------+--------+-------+--------+----------------------+------------------+ ICA Distal70      32                                                      +----------+--------+-------+--------+----------------------+------------------+ ECA       154     21             heterogenous and                                                          smooth                                    +----------+--------+-------+--------+----------------------+------------------+ Portions of this table do not appear on this page. +----------+--------+-------+----------------+------------+           PSV cm/sEDV cmsDescribe        Arm Pressure +----------+--------+-------+----------------+------------+ [id number]            Multiphasic, WNL             +----------+--------+-------+----------------+------------+ +---------+--------+--+--------+--+---------+ VertebralPSV cm/s76EDV cm/s17Antegrade +---------+--------+--+--------+--+---------+ Left Carotid Findings: +----------+--------+--------+--------+------------+------------------+           PSV cm/sEDV cm/sStenosisDescribe    Comments           +----------+--------+--------+--------+------------+------------------+ CCA Prox  116     24                                             +----------+--------+--------+--------+------------+------------------+ CCA Distal79      19              heterogenousintimal thickening +----------+--------+--------+--------+------------+------------------+ ICA Prox  44      15      1-39%   heterogenous                   +----------+--------+--------+--------+------------+------------------+ ICA Distal55      22                                             +----------+--------+--------+--------+------------+------------------+ ECA       100     14                                             +----------+--------+--------+--------+------------+------------------+ +----------+--------+--------+----------------+------------+  SubclavianPSV cm/sEDV cm/sDescribe        Arm Pressure +----------+--------+--------+----------------+------------+           161             Multiphasic, WNL             +----------+--------+--------+----------------+------------+ +---------+--------+--+--------+--+---------+ VertebralPSV cm/s46EDV cm/s19Antegrade  +---------+--------+--+--------+--+---------+  ABI Findings: +---------+------------------+-----+---------+--------+ Right    Rt Pressure (mmHg)IndexWaveform Comment  +---------+------------------+-----+---------+--------+ Brachial 118                    triphasic         +---------+------------------+-----+---------+--------+ PTA      121               1.03 biphasic          +---------+------------------+-----+---------+--------+ DP       111               0.94 triphasic         +---------+------------------+-----+---------+--------+ Great Toe101               0.86 Normal            +---------+------------------+-----+---------+--------+ +---------+------------------+-----+---------+------------+ Left     Lt Pressure (mmHg)IndexWaveform Comment      +---------+------------------+-----+---------+------------+ Brachial 112                    triphasic             +---------+------------------+-----+---------+------------+ PTA      134               1.14 triphasic             +---------+------------------+-----+---------+------------+ DP       124               1.05 triphasic             +---------+------------------+-----+---------+------------+ Great Toe74                0.63 Absent   mild disease +---------+------------------+-----+---------+------------+ +-------+---------------+----------------+ ABI/TBIToday's ABI/TBIPrevious ABI/TBI +-------+---------------+----------------+ Right  1.03/0.86                       +-------+---------------+----------------+ Left   1.14/0.63                       +-------+---------------+----------------+  Right Doppler Findings: +--------+--------+-----+---------+--------+ Site    PressureIndexDoppler  Comments +--------+--------+-----+---------+--------+ [id number]          triphasic         +--------+--------+-----+---------+--------+ Radial               triphasic          +--------+--------+-----+---------+--------+ Ulnar                triphasic         +--------+--------+-----+---------+--------+  Left Doppler Findings: +--------+--------+-----+---------+--------+ Site    PressureIndexDoppler  Comments +--------+--------+-----+---------+--------+ [id number]          triphasic         +--------+--------+-----+---------+--------+ Radial               triphasic         +--------+--------+-----+---------+--------+ Ulnar                triphasic         +--------+--------+-----+---------+--------+  Summary: Right Carotid: Velocities in the right ICA are consistent with a 1-39% stenosis.  The ECA appears <50% stenosed. The extracranial vessels were                near-normal with only minimal wall thickening or plaque. Left Carotid: Velocities in the left ICA are consistent with a 1-39% stenosis.               The extracranial vessels were near-normal with only minimal wall               thickening or plaque. Vertebrals:  Bilateral vertebral arteries demonstrate antegrade flow. Subclavians: Normal flow hemodynamics were seen in bilateral subclavian              arteries. Right ABI: Resting right ankle-brachial index is within normal range. No evidence of significant right lower extremity arterial disease. The right toe-brachial index is normal. Left ABI: Resting left ankle-brachial index is within normal range. No evidence of significant left lower extremity arterial disease. Right Upper Extremity: Doppler waveforms decrease >50% with right radial compression. Doppler waveforms remain within normal limits with right ulnar compression. Left Upper Extremity: Doppler waveforms decrease >50% with left radial compression. Doppler waveforms remain within normal limits with left ulnar compression.     Preliminary    Telemetry    Sinus rhythm/sinus bradycardia without significant arrhythmia- Personally Reviewed  ECG    No new tracing as of  10/17/20- Personally Reviewed  Cardiac Studies    LHC 10/15/20:   Prox Cx lesion is 30% stenosed.  Previously placed Mid RCA to Dist RCA drug eluting stent is widely patent.  Previously placed 3rd RPL drug eluting stent is widely patent.  Prox RCA lesion is 95% stenosed.  RPAV lesion is 99% stenosed.  RPDA lesion is 70% stenosed.  1st RPL lesion is 80% stenosed.  RV Branch lesion is 80% stenosed.  Mid LM lesion is 60% stenosed.  2nd Mrg lesion is 80% stenosed.  Prox LAD to Mid LAD lesion is 60% stenosed.  Previously placed 1st Diag-1 stent (unknown type) is widely patent.  1st Diag-2 lesion is 80% stenosed.  1. Significant left main and three-vessel coronary artery disease. 2. Left ventricular angiography was not performed. Ejection fraction appeared normal by echo. Mildly elevated left ventricular end-diastolic pressure.  Recommendations: Recommend evaluation for CABG. Resume heparin 8 hours after sheath pull.  Echocardiogram 10/15/20:  1. Left ventricular ejection fraction, by estimation, is 60 to 65%. The  left ventricle has normal function. The left ventricle has no regional  wall motion abnormalities. There is moderate left ventricular hypertrophy.  Left ventricular diastolic  parameters are indeterminate.  2. Right ventricular systolic function is normal. The right ventricular  size is normal. There is mildly elevated pulmonary artery systolic  pressure. The estimated right ventricular systolic pressure is 26.9 mmHg.  3. The mitral valve is normal in structure. Trivial mitral valve  regurgitation.  4. The aortic valve is tricuspid. Aortic valve regurgitation is not  visualized. No aortic stenosis is present.  5. The inferior vena cava is normal in size with greater than 50%  respiratory variability, suggesting right atrial pressure of 3 mmHg.    Left Cardiac Catheterization 03/11/2017:  Unstable angina/non-ST elevation myocardial  infarction presentation.  The previously stented proximal to mid RCA is widely patent  De novo 95-99% distal RCA before the origin of the PDA.  De novo 95% stenosis in the third left ventricular branch of the right coronary artery.  Moderate obstruction in the trunk of the left main, 40%. This is unchanged from prior  angiogram.  Moderate eccentric 40-50% proximal to mid LAD. Diffuse 30-50% disease in the mid and distal LAD. The largest previously stented diagonal reveals no evidence of in-stent restenosis.  Normal left ventricular size and function with mildly elevated LVEDP consistent with diastolic heart failure, asymptomatic. EF 60%.  Successful stenting of the distal RCA and third left ventricular branch reducing stenoses to 0% and 0% with TIMI grade 3 flow. Onyx 3.5 x 18 and 2.25 x 15 DES deployed at 15 atm in distal RCA and third LV branch respectively. TIMI grade 3 flow is noted beyond age treatment area.  Recommendations:  This gentleman needs aggressive risk factor modification he has moderate to moderately severe left main and LAD disease although I do not believe they are critical and surgical therapy at this time would risk graft occlusion because native flow is not impaired.  Aspirin and Brilinta 12 months  Hopeful discharge in a.m.   Patient Profile     65 y.o. male with a history of multivessel CAD s/p multiple PCIs, hypertension, hyperlipidemia, type 2 diabetes mellitus, obstructive sleep apnea(CPAP machine broken), cirrhosis, and Castleman's disease who was transferred from [facility] for further management of NSTEMI after presenting there with chest pain.  Assessment & Plan    1. NSTEMI with CAD: -Pt with a hx of CAD with multiple stents in the past, most recently in 2018 who presented with chest pain with radiation to the left jaw while at rest>>LHC performed 4/27 which showed significant LM and three-vessel coronary artery disease. -Seen by TCTS with plans for  revascularization early next week.  -HsT 43>>>63 -Echocardiogram performed 10/15/20 which showed LVEF at 60-65% with no RWMA, mod LVH, mildly elevated PASP at 35.9mHg, and trivial MR -Heparin resumed hours after sheath pull   -Continue ASA, carvedilol. No statin in the setting of intolerance -Seen by TCTS with plans for CABG early next week -Denies chest pain    2. HTN: -Elevated, 152/97>>132/86>>132/86 -Continue amlodipine to 12mtoday, carvedilol 2525mID and losartan 100m97mStart Spironolactone today   3. HLD: -LDL, 202 with known intolerance to atorvastatin>>started on Crestor 20mg51m to hx of cirrhosis  -Will need lipid clinic referral after hospital discharge for possible PCSK9 inhibitor  -Continue Crestor 20mg 64m 4. DM2: -HbA1c, 12.4 on 10/15/20 -SSI for glucose control while inpatient  -Poor recall on home medications -DM coordinator to make recommendations>>added Lantus 20u  -Change to resistant SSI today  -Not an ideal candidate for SGLT2 at this time until A1C lower   5. Cirrhosis: -AST/ALT>>98/109>>previously 54/95 from 08/21/2019 -Pt restarted on home Lactulose    6. Hematochezia: -Hb WNL at 14.8 -Hemeoccult stool>>pending    7. Tobacco use: -Cessation strongly encouraged  -Will assess the need for nicotine patch    Signed, Jill [facility] Pager: [phone], 7:30 AM     For questions or updates, please contact   Please consult [email or web address] for contact info under Cardiology/STEMI.  Patient seen, examined. Available data reviewed. Agree with findings, assessment, and plan as outlined by Jill MKathyrn Drown.  The patient is independently interviewed and examined.  He is resting comfortably in bed in no distress.  Lungs are clear, heart is regular rate and rhythm with no murmur gallop, abdomen soft and nontender with no organomegaly, extremities with no edema, skin is warm and dry with no rash.  The patient is stable from a  cardiovascular perspective on IV heparin in the setting of severe multivessel coronary artery disease awaiting  CABG early next week.  He requires inpatient hospitalization until CABG because of crescendo symptoms of angina in the setting of left main and critical RCA stenosis.  RCA stenosis is critically tight and at risk of abrupt closure, hence systemic anticoagulation with heparin.  Changing insulin sliding scale to resistant for better glycemic control.  Otherwise as outlined above.  Sherren Mocha, M.D. 10/17/2020 11:00 AM

## 2020-10-17 NOTE — Progress Notes (Signed)
ANTICOAGULATION CONSULT NOTE - Follow Up Consult  Pharmacy Consult for Heparin  Indication: chest pain/ACS  Patient Measurements: Height: 5' 9"  (175.3 cm) Weight: 95.8 kg (211 lb 4.8 oz) IBW/kg (Calculated) : 70.7  Heparin Dosing Wt: 90.4 kg  Vital Signs: Temp: 97.8 F (36.6 C) (04/28 2058) Temp Source: Oral (04/28 2058) BP: 132/86 (04/28 2058) Pulse Rate: 54 (04/28 2058)  Labs: Recent Labs    10/15/20 0005 10/15/20 0155 10/15/20 1142 10/16/20 0219 10/16/20 0841 10/16/20 1017 10/16/20 1620 10/17/20 0049  HGB 15.2  --   --  14.8  --   --   --  14.4  HCT 45.9  --   --  45.0  --   --   --  42.3  PLT 202  --   --  197  --   --   --  187  APTT  --   --   --   --   --  34  --   --   LABPROT  --   --   --   --   --  12.9  --   --   INR  --   --   --   --   --  1.0  --   --   HEPARINUNFRC  --   --    < >  --  0.12*  --  0.15* 0.32  CREATININE 0.65 0.60*  --  0.62  --   --   --   --   TROPONINIHS 43* 63*  --   --   --   --   --   --    < > = values in this interval not displayed.    Estimated Creatinine Clearance: 107.9 mL/min (by C-G formula based on SCr of 0.62 mg/dL).  Assessment: Dwayne Garrett is a 65 y.o. male who presents to the ED with intermittent chest pain this past evening. Found to have mildly elevated troponin. Starting heparin per pharmacy. CBC/renal function good. PTA meds reviewed.   S/p cath lab with 3v CAD and w/u for CABG - earliest available date is Monday, May 2.   Heparin level therapeutic (0.32) on gtt at 1700 units/hr. No bleeding noted.  Goal of Therapy:  Heparin level 0.3-0.7 units/ml Monitor platelets by anticoagulation protocol: Yes   Plan:  Continue heparin at 1700 units/hr  F/u 6 hr heparin level  Sherlon Handing, PharmD, BCPS Please see [email or web address] for complete clinical pharmacist phone list 10/17/2020 1:54 AM

## 2020-10-18 DIAGNOSIS — I214 Non-ST elevation (NSTEMI) myocardial infarction: Secondary | ICD-10-CM | POA: Diagnosis not present

## 2020-10-18 LAB — HEPARIN LEVEL (UNFRACTIONATED)
Heparin Unfractionated: 0.27 IU/mL — ABNORMAL LOW (ref 0.30–0.70)
Heparin Unfractionated: 0.25 IU/mL — ABNORMAL LOW (ref 0.30–0.70)
Heparin Unfractionated: 0.41 IU/mL (ref 0.30–0.70)

## 2020-10-18 LAB — GLUCOSE, CAPILLARY
Glucose-Capillary: 216 mg/dL — ABNORMAL HIGH (ref 70–99)
Glucose-Capillary: 245 mg/dL — ABNORMAL HIGH (ref 70–99)
Glucose-Capillary: 182 mg/dL — ABNORMAL HIGH (ref 70–99)
Glucose-Capillary: 220 mg/dL — ABNORMAL HIGH (ref 70–99)

## 2020-10-18 LAB — BASIC METABOLIC PANEL
Anion gap: 9 (ref 5–15)
BUN: 12 mg/dL (ref 8–23)
CO2: 26 mmol/L (ref 22–32)
Calcium: 8.8 mg/dL — ABNORMAL LOW (ref 8.9–10.3)
Chloride: 101 mmol/L (ref 98–111)
Creatinine, Ser: 0.66 mg/dL (ref 0.61–1.24)
GFR, Estimated: >60 mL/min (ref >60)
Glucose, Bld: 243 mg/dL — ABNORMAL HIGH (ref 70–99)
Potassium: 3.6 mmol/L (ref 3.5–5.1)
Sodium: 136 mmol/L (ref 135–145)

## 2020-10-18 LAB — CBC
HCT: 42.4 % (ref 39.0–52.0)
Hemoglobin: 14.2 g/dL (ref 13.0–17.0)
MCH: 30.7 pg (ref 26.0–34.0)
MCHC: 33.5 g/dL (ref 30.0–36.0)
MCV: 91.6 fL (ref 80.0–100.0)
Platelets: 193 10*3/uL (ref 150–400)
RBC: 4.63 MIL/uL (ref 4.22–5.81)
RDW: 12.7 % (ref 11.5–15.5)
WBC: 7.3 10*3/uL (ref 4.0–10.5)
nRBC: 0 % (ref 0.0–0.2)

## 2020-10-18 LAB — OCCULT BLOOD X 1 CARD TO LAB, STOOL: Fecal Occult Bld: NEGATIVE

## 2020-10-18 NOTE — Progress Notes (Signed)
ANTICOAGULATION CONSULT NOTE  Pharmacy Consult for Heparin  Indication: chest pain/ACS  Patient Measurements: Height: 5' 9"  (175.3 cm) Weight: 96.3 kg (212 lb 4.8 oz) IBW/kg (Calculated) : 70.7  Heparin Dosing Wt: 90.4 kg  Vital Signs: Temp: 97.6 F (36.4 C) (04/30 0635) Temp Source: Oral (04/30 0635) BP: 166/91 (04/30 1708) Pulse Rate: 61 (04/30 1708)  Labs: Recent Labs    10/16/20 0219 10/16/20 0841 10/16/20 1017 10/16/20 1620 10/17/20 0049 10/17/20 0744 10/18/20 0237 10/18/20 1004 10/18/20 1703  HGB 14.8  --   --   --  14.4  --  14.2  --   --   HCT 45.0  --   --   --  42.3  --  42.4  --   --   PLT 197  --   --   --  187  --  193  --   --   APTT  --   --  34  --   --   --   --   --   --   LABPROT  --   --  12.9  --   --   --   --   --   --   INR  --   --  1.0  --   --   --   --   --   --   HEPARINUNFRC  --    < >  --    < > 0.32   < > 0.27* 0.25* 0.41  CREATININE 0.62  --   --   --  0.63  --  0.66  --   --    < > = values in this interval not displayed.    Estimated Creatinine Clearance: 108.1 mL/min (by C-G formula based on SCr of 0.66 mg/dL).  Assessment: Dwayne Garrett is a 65 y.o. male who presents to the ED with intermittent chest pain this past evening. Found to have mildly elevated troponin. Starting heparin per pharmacy. CBC/renal function good. PTA meds reviewed.   S/p cath lab with 3v CAD and w/u for CABG - earliest available date is Monday, May 2.  -heparin level= 0.41 on 1900 units/hr  Goal of Therapy:  Heparin level 0.3-0.7 units/ml Monitor platelets by anticoagulation protocol: Yes   Plan:  Continue IV heparin 1900 units/hr  Daily heparin level, CBC while on heparin  Hildred Laser, PharmD Clinical Pharmacist **Pharmacist phone directory can now be found on [email or web address] ([email or web address]).  Listed under [place].

## 2020-10-18 NOTE — Progress Notes (Signed)
ANTICOAGULATION CONSULT NOTE - Follow Up Consult  Pharmacy Consult for Heparin  Indication: chest pain/ACS  Patient Measurements: Height: 5' 9"  (175.3 cm) Weight: 96.3 kg (212 lb 4.8 oz) IBW/kg (Calculated) : 70.7  Heparin Dosing Wt: 90.4 kg  Vital Signs: Temp: 97.7 F (36.5 C) (04/29 2040) Temp Source: Oral (04/29 2040) BP: 154/91 (04/29 2040) Pulse Rate: 63 (04/29 2040)  Labs: Recent Labs    10/16/20 0219 10/16/20 0841 10/16/20 1017 10/16/20 1620 10/17/20 0049 10/17/20 0744 10/18/20 0237  HGB 14.8  --   --   --  14.4  --  14.2  HCT 45.0  --   --   --  42.3  --  42.4  PLT 197  --   --   --  187  --  193  APTT  --   --  34  --   --   --   --   LABPROT  --   --  12.9  --   --   --   --   INR  --   --  1.0  --   --   --   --   HEPARINUNFRC  --    < >  --    < > 0.32 0.37 0.27*  CREATININE 0.62  --   --   --  0.63  --  0.66   < > = values in this interval not displayed.    Estimated Creatinine Clearance: 108.1 mL/min (by C-G formula based on SCr of 0.66 mg/dL).  Assessment: Dwayne Garrett is a 65 y.o. male who presents to the ED with intermittent chest pain this past evening. Found to have mildly elevated troponin. Starting heparin per pharmacy. CBC/renal function good. PTA meds reviewed.   S/p cath lab with 3v CAD and w/u for CABG - earliest available date is Monday, May 2.   Heparin level down to subtherapeutic (0.27) on gtt at 1700 units/hr. No s/sx of bleeding or infusion issues.   Goal of Therapy:  Heparin level 0.3-0.7 units/ml Monitor platelets by anticoagulation protocol: Yes   Plan:  Increase heparin to 1900 units/hr  F/u 6 hr heparin level  Sherlon Handing, PharmD, BCPS Please see [email or web address] for complete clinical pharmacist phone list 10/18/2020 4:04 AM

## 2020-10-18 NOTE — Plan of Care (Signed)
  Problem: Education: Goal: Knowledge of General Education information will improve Description: Including pain rating scale, medication(s)/side effects and non-pharmacologic comfort measures Outcome: Progressing   Problem: Health Behavior/Discharge Planning: Goal: Ability to manage health-related needs will improve Outcome: Progressing   Problem: Education: Goal: Understanding of cardiac disease, CV risk reduction, and recovery process will improve Outcome: Progressing   Problem: Activity: Goal: Ability to tolerate increased activity will improve Outcome: Progressing   Problem: Cardiac: Goal: Ability to achieve and maintain adequate cardiovascular perfusion will improve Outcome: Progressing   Problem: Health Behavior/Discharge Planning: Goal: Ability to safely manage health-related needs after discharge will improve Outcome: Progressing

## 2020-10-18 NOTE — Progress Notes (Signed)
ANTICOAGULATION CONSULT NOTE  Pharmacy Consult for Heparin  Indication: chest pain/ACS  Patient Measurements: Height: 5' 9"  (175.3 cm) Weight: 96.3 kg (212 lb 4.8 oz) IBW/kg (Calculated) : 70.7  Heparin Dosing Wt: 90.4 kg  Vital Signs: Temp: 97.6 F (36.4 C) (04/30 0635) Temp Source: Oral (04/30 0635) BP: 174/98 (04/30 0720) Pulse Rate: 61 (04/30 0720)  Labs: Recent Labs    10/16/20 0219 10/16/20 0841 10/16/20 1017 10/16/20 1620 10/17/20 0049 10/17/20 0744 10/18/20 0237 10/18/20 1004  HGB 14.8  --   --   --  14.4  --  14.2  --   HCT 45.0  --   --   --  42.3  --  42.4  --   PLT 197  --   --   --  187  --  193  --   APTT  --   --  34  --   --   --   --   --   LABPROT  --   --  12.9  --   --   --   --   --   INR  --   --  1.0  --   --   --   --   --   HEPARINUNFRC  --    < >  --    < > 0.32 0.37 0.27* 0.25*  CREATININE 0.62  --   --   --  0.63  --  0.66  --    < > = values in this interval not displayed.    Estimated Creatinine Clearance: 108.1 mL/min (by C-G formula based on SCr of 0.66 mg/dL).  Assessment: Dwayne Garrett is a 65 y.o. male who presents to the ED with intermittent chest pain this past evening. Found to have mildly elevated troponin. Starting heparin per pharmacy. CBC/renal function good. PTA meds reviewed.   S/p cath lab with 3v CAD and w/u for CABG - earliest available date is Monday, May 2.   Heparin level this morning dropped 0.27>0.25 despite rate increase to 1900 units/h. Per RN, found heparin infusion had been turned off this am around 1030, unclear by whom or for how long it was turned off. Will hold off on adjusting rate at this time and recheck heparin level 6h from when heparin gtt was turned back on. No s/sx bleeding. CBC stable/wnl.   Goal of Therapy:  Heparin level 0.3-0.7 units/ml Monitor platelets by anticoagulation protocol: Yes   Plan:  Continue IV heparin 1900 units/hr  F/u 6 hr heparin level Daily heparin level, CBC while on  heparin Monitor s/sx bleeding  Rebbeca Paul, PharmD PGY1 Pharmacy Resident 10/18/2020 11:57 AM  Please check [email or web address] for unit-specific pharmacy phone numbers.

## 2020-10-18 NOTE — Progress Notes (Signed)
Progress Note  Patient Name: Dwayne Garrett Date of Encounter: 10/18/2020  Primary Cardiologist: Rozann Lesches, MD  Subjective   Currently feeling well.  No chest pain or shortness of breath.  Inpatient Medications    Scheduled Meds: . amLODipine  10 mg Oral Daily  . aspirin  81 mg Oral Daily  . carvedilol  25 mg Oral BID WC  . gabapentin  300 mg Oral Daily  . insulin aspart  0-20 Units Subcutaneous TID WC  . insulin aspart  0-5 Units Subcutaneous QHS  . insulin glargine  20 Units Subcutaneous Daily  . lactulose  10 g Oral BID  . losartan  100 mg Oral Daily  . pantoprazole  40 mg Oral Daily  . rosuvastatin  20 mg Oral Daily  . sodium chloride flush  3 mL Intravenous Q12H  . spironolactone  25 mg Oral Daily   Continuous Infusions: . sodium chloride    . heparin 1,900 Units/hr (10/18/20 0408)   PRN Meds: sodium chloride, nitroGLYCERIN, ondansetron (ZOFRAN) IV, sodium chloride flush   Vital Signs    Vitals:   10/17/20 2040 10/18/20 0635 10/18/20 0649 10/18/20 0720  BP: (!) 154/91 (!) 177/103 (!) 174/104 (!) 174/98  Pulse: 63 62  61  Resp: 17 18    Temp: 97.7 F (36.5 C) 97.6 F (36.4 C)    TempSrc: Oral Oral    SpO2: 98% 97%    Weight:      Height:        Intake/Output Summary (Last 24 hours) at 10/18/2020 0844 Last data filed at 10/18/2020 0500 Gross per 24 hour  Intake 1483.3 ml  Output 2225 ml  Net -741.7 ml   Filed Weights   10/15/20 0003 10/16/20 0509 10/17/20 0351  Weight: 95.3 kg 95.8 kg 96.3 kg    Physical Exam   GEN: Well nourished, well developed, in no acute distress  HEENT: normal  Neck: no JVD, carotid bruits, or masses Cardiac: RRR; no murmurs, rubs, or gallops,no edema  Respiratory:  clear to auscultation bilaterally, normal work of breathing GI: soft, nontender, nondistended, + BS MS: no deformity or atrophy  Skin: warm and dry Neuro:  Strength and sensation are intact Psych: euthymic mood, full affect   Labs     Chemistry Recent Labs  Lab 10/15/20 1142 10/16/20 0219 10/17/20 0049 10/18/20 0237  NA  --  136 133* 136  K  --  3.7 3.5 3.6  CL  --  103 100 101  CO2  --  23 26 26   GLUCOSE  --  280* 297* 243*  BUN  --  9 13 12   CREATININE  --  0.62 0.63 0.66  CALCIUM  --  9.0 8.9 8.8*  PROT 6.7  --   --   --   ALBUMIN 3.4*  --   --   --   AST 98*  --   --   --   ALT 109*  --   --   --   ALKPHOS 60  --   --   --   BILITOT 1.4*  --   --   --   GFRNONAA  --  >60 >60 >60  ANIONGAP  --  10 7 9      Hematology Recent Labs  Lab 10/16/20 0219 10/17/20 0049 10/18/20 0237  WBC 7.3 7.3 7.3  RBC 4.88 4.69 4.63  HGB 14.8 14.4 14.2  HCT 45.0 42.3 42.4  MCV 92.2 90.2 91.6  MCH  30.3 30.7 30.7  MCHC 32.9 34.0 33.5  RDW 13.2 12.9 12.7  PLT 197 187 193    Cardiac EnzymesNo results for input(s): TROPONINI in the last 168 hours. No results for input(s): TROPIPOC in the last 168 hours.   BNPNo results for input(s): BNP, PROBNP in the last 168 hours.   DDimer No results for input(s): DDIMER in the last 168 hours.   Radiology    VAS US DOPPLER PRE CABG  Result Date: 10/16/2020 PREOPERATIVE VASCULAR EVALUATION Patient Name:  Dwayne Garrett  Date of Exam:   10/16/2020 Medical Rec #: [id number]         Accession #:    [id number] Date of Birth: [birth year]         Patient Gender: M Patient Age:   65Y Exam Location:  [facility] Procedure:      VAS US DOPPLER PRE CABG Referring Phys: [id number] HARRELL O LIGHTFOOT --------------------------------------------------------------------------------  Indications:            Pre-CABG. Risk Factors:           Hypertension, current smoker, prior MI, coronary artery                         disease. Vascular Interventions: PCI w/ stent placement. Performing Technologist: Rogelia Rohrer RVT/RDMS Supporting Technologist: Darlin Coco RDMS,RVT  Examination Guidelines: A complete evaluation includes B-mode imaging, spectral Doppler, color Doppler, and power Doppler as  needed of all accessible portions of each vessel. Bilateral testing is considered an integral part of a complete examination. Limited examinations for reoccurring indications may be performed as noted.  Right Carotid Findings: +----------+--------+-------+--------+----------------------+------------------+           PSV cm/sEDV    StenosisDescribe              Comments                             cm/s                                                    +----------+--------+-------+--------+----------------------+------------------+ CCA Prox  90      20                                   intimal thickening +----------+--------+-------+--------+----------------------+------------------+ CCA Distal80      17             heterogenous          intimal thickening +----------+--------+-------+--------+----------------------+------------------+ ICA Prox  102     27             hyperechoic                              +----------+--------+-------+--------+----------------------+------------------+ ICA Distal70      32                                                      +----------+--------+-------+--------+----------------------+------------------+ ECA       154  21             heterogenous and                                                          smooth                                   +----------+--------+-------+--------+----------------------+------------------+ Portions of this table do not appear on this page. +----------+--------+-------+----------------+------------+           PSV cm/sEDV cmsDescribe        Arm Pressure +----------+--------+-------+----------------+------------+ [id number]            Multiphasic, WNL             +----------+--------+-------+----------------+------------+ +---------+--------+--+--------+--+---------+ VertebralPSV cm/s76EDV cm/s17Antegrade +---------+--------+--+--------+--+---------+ Left Carotid  Findings: +----------+--------+--------+--------+------------+------------------+           PSV cm/sEDV cm/sStenosisDescribe    Comments           +----------+--------+--------+--------+------------+------------------+ CCA Prox  116     24                                             +----------+--------+--------+--------+------------+------------------+ CCA Distal79      19              heterogenousintimal thickening +----------+--------+--------+--------+------------+------------------+ ICA Prox  44      15      1-39%   heterogenous                   +----------+--------+--------+--------+------------+------------------+ ICA Distal55      22                                             +----------+--------+--------+--------+------------+------------------+ ECA       100     14                                             +----------+--------+--------+--------+------------+------------------+ +----------+--------+--------+----------------+------------+ SubclavianPSV cm/sEDV cm/sDescribe        Arm Pressure +----------+--------+--------+----------------+------------+           161             Multiphasic, WNL             +----------+--------+--------+----------------+------------+ +---------+--------+--+--------+--+---------+ VertebralPSV cm/s46EDV cm/s19Antegrade +---------+--------+--+--------+--+---------+  ABI Findings: +---------+------------------+-----+---------+--------+ Right    Rt Pressure (mmHg)IndexWaveform Comment  +---------+------------------+-----+---------+--------+ Brachial 118                    triphasic         +---------+------------------+-----+---------+--------+ PTA      121               1.03 biphasic          +---------+------------------+-----+---------+--------+ DP       111               0.94 triphasic         +---------+------------------+-----+---------+--------+  Great Toe101               0.86  Normal            +---------+------------------+-----+---------+--------+ +---------+------------------+-----+---------+------------+ Left     Lt Pressure (mmHg)IndexWaveform Comment      +---------+------------------+-----+---------+------------+ Brachial 112                    triphasic             +---------+------------------+-----+---------+------------+ PTA      134               1.14 triphasic             +---------+------------------+-----+---------+------------+ DP       124               1.05 triphasic             +---------+------------------+-----+---------+------------+ Great Toe74                0.63 Absent   mild disease +---------+------------------+-----+---------+------------+ +-------+---------------+----------------+ ABI/TBIToday's ABI/TBIPrevious ABI/TBI +-------+---------------+----------------+ Right  1.03/0.86                       +-------+---------------+----------------+ Left   1.14/0.63                       +-------+---------------+----------------+  Right Doppler Findings: +--------+--------+-----+---------+--------+ Site    PressureIndexDoppler  Comments +--------+--------+-----+---------+--------+ [id number]          triphasic         +--------+--------+-----+---------+--------+ Radial               triphasic         +--------+--------+-----+---------+--------+ Ulnar                triphasic         +--------+--------+-----+---------+--------+  Left Doppler Findings: +--------+--------+-----+---------+--------+ Site    PressureIndexDoppler  Comments +--------+--------+-----+---------+--------+ [id number]          triphasic         +--------+--------+-----+---------+--------+ Radial               triphasic         +--------+--------+-----+---------+--------+ Ulnar                triphasic         +--------+--------+-----+---------+--------+  Summary: Right Carotid: Velocities in the right ICA  are consistent with a 1-39% stenosis.                The ECA appears <50% stenosed. The extracranial vessels were                near-normal with only minimal wall thickening or plaque. Left Carotid: Velocities in the left ICA are consistent with a 1-39% stenosis.               The extracranial vessels were near-normal with only minimal wall               thickening or plaque. Vertebrals:  Bilateral vertebral arteries demonstrate antegrade flow. Subclavians: Normal flow hemodynamics were seen in bilateral subclavian              arteries. Right ABI: Resting right ankle-brachial index is within normal range. No evidence of significant right lower extremity arterial disease. The right toe-brachial index is normal. Left ABI: Resting left ankle-brachial index is within normal range. No evidence of significant left lower extremity arterial disease. Right Upper Extremity: Doppler  waveforms decrease >50% with right radial compression. Doppler waveforms remain within normal limits with right ulnar compression. Left Upper Extremity: Doppler waveforms decrease >50% with left radial compression. Doppler waveforms remain within normal limits with left ulnar compression.     Preliminary    Telemetry    Sinus rhythm-personally reviewed  ECG    Sinus rhythm, inferior T wave inversions-personally reviewed  Cardiac Studies    LHC 10/15/20:   Prox Cx lesion is 30% stenosed.  Previously placed Mid RCA to Dist RCA drug eluting stent is widely patent.  Previously placed 3rd RPL drug eluting stent is widely patent.  Prox RCA lesion is 95% stenosed.  RPAV lesion is 99% stenosed.  RPDA lesion is 70% stenosed.  1st RPL lesion is 80% stenosed.  RV Branch lesion is 80% stenosed.  Mid LM lesion is 60% stenosed.  2nd Mrg lesion is 80% stenosed.  Prox LAD to Mid LAD lesion is 60% stenosed.  Previously placed 1st Diag-1 stent (unknown type) is widely patent.  1st Diag-2 lesion is 80% stenosed.  1.  Significant left main and three-vessel coronary artery disease. 2. Left ventricular angiography was not performed. Ejection fraction appeared normal by echo. Mildly elevated left ventricular end-diastolic pressure.  Recommendations: Recommend evaluation for CABG. Resume heparin 8 hours after sheath pull.  Echocardiogram 10/15/20:  1. Left ventricular ejection fraction, by estimation, is 60 to 65%. The  left ventricle has normal function. The left ventricle has no regional  wall motion abnormalities. There is moderate left ventricular hypertrophy.  Left ventricular diastolic  parameters are indeterminate.  2. Right ventricular systolic function is normal. The right ventricular  size is normal. There is mildly elevated pulmonary artery systolic  pressure. The estimated right ventricular systolic pressure is 33.5 mmHg.  3. The mitral valve is normal in structure. Trivial mitral valve  regurgitation.  4. The aortic valve is tricuspid. Aortic valve regurgitation is not  visualized. No aortic stenosis is present.  5. The inferior vena cava is normal in size with greater than 50%  respiratory variability, suggesting right atrial pressure of 3 mmHg.    Left Cardiac Catheterization 03/11/2017:  Unstable angina/non-ST elevation myocardial infarction presentation.  The previously stented proximal to mid RCA is widely patent  De novo 95-99% distal RCA before the origin of the PDA.  De novo 95% stenosis in the third left ventricular branch of the right coronary artery.  Moderate obstruction in the trunk of the left main, 40%. This is unchanged from prior angiogram.  Moderate eccentric 40-50% proximal to mid LAD. Diffuse 30-50% disease in the mid and distal LAD. The largest previously stented diagonal reveals no evidence of in-stent restenosis.  Normal left ventricular size and function with mildly elevated LVEDP consistent with diastolic heart failure, asymptomatic. EF  60%.  Successful stenting of the distal RCA and third left ventricular branch reducing stenoses to 0% and 0% with TIMI grade 3 flow. Onyx 3.5 x 18 and 2.25 x 15 DES deployed at 15 atm in distal RCA and third LV branch respectively. TIMI grade 3 flow is noted beyond age treatment area.  Recommendations:  This gentleman needs aggressive risk factor modification he has moderate to moderately severe left main and LAD disease although I do not believe they are critical and surgical therapy at this time would risk graft occlusion because native flow is not impaired.  Aspirin and Brilinta 12 months  Hopeful discharge in a.m.   Patient Profile     65 y.o. male with  a history of multivessel CAD s/p multiple PCIs, hypertension, hyperlipidemia, type 2 diabetes mellitus, obstructive sleep apnea(CPAP machine broken), cirrhosis, and Castleman's disease who was transferred from [facility] for further management of NSTEMI after presenting there with chest pain.  Assessment & Plan    1.  Non-STEMI with coronary artery disease: Has had multiple stents in the past.  Found to have severe left main and RCA disease.  CABG planned for Monday.  Due to severe disease, Sakiyah Shur keep patient on heparin until his operation.     2.  Hypertension: Blood pressure remains elevated.  Eyoel Throgmorton adjust antihypertensives today.    3.  Hyperlipidemia: Goal LDL less than 70.  Currently on Crestor.  Illias Pantano need to follow-up in the lipid clinic after hospital discharge.    4.  Type 2 diabetes: Hemoglobin A1c of 12.4.  Currently on sliding scale insulin while an inpatient.  Diabetes coordinator recommendations appreciated.    5.  Cirrhosis: Patient restarted on home lactulose   6. Hematochezia: -Hb WNL at 14.8 -Hemeoccult stool>>pending    7.  Tobacco abuse: Cessation strongly encouraged.     Signed, Allegra Lai, MD [facility] 10/18/2020, 8:44 AM     For questions or updates, please contact   Please consult  [email or web address] for contact info under Cardiology/STEMI.

## 2020-10-19 DIAGNOSIS — I214 Non-ST elevation (NSTEMI) myocardial infarction: Secondary | ICD-10-CM | POA: Diagnosis not present

## 2020-10-19 LAB — BASIC METABOLIC PANEL
Anion gap: 9 (ref 5–15)
BUN: 13 mg/dL (ref 8–23)
CO2: 25 mmol/L (ref 22–32)
Calcium: 9.1 mg/dL (ref 8.9–10.3)
Chloride: 102 mmol/L (ref 98–111)
Creatinine, Ser: 0.8 mg/dL (ref 0.61–1.24)
GFR, Estimated: >60 mL/min (ref >60)
Glucose, Bld: 365 mg/dL — ABNORMAL HIGH (ref 70–99)
Potassium: 3.6 mmol/L (ref 3.5–5.1)
Sodium: 136 mmol/L (ref 135–145)

## 2020-10-19 LAB — CBC
HCT: 42.4 % (ref 39.0–52.0)
Hemoglobin: 13.9 g/dL (ref 13.0–17.0)
MCH: 30.3 pg (ref 26.0–34.0)
MCHC: 32.8 g/dL (ref 30.0–36.0)
MCV: 92.4 fL (ref 80.0–100.0)
Platelets: 187 10*3/uL (ref 150–400)
RBC: 4.59 MIL/uL (ref 4.22–5.81)
RDW: 12.9 % (ref 11.5–15.5)
WBC: 6.9 10*3/uL (ref 4.0–10.5)
nRBC: 0 % (ref 0.0–0.2)

## 2020-10-19 LAB — GLUCOSE, CAPILLARY
Glucose-Capillary: 289 mg/dL — ABNORMAL HIGH (ref 70–99)
Glucose-Capillary: 254 mg/dL — ABNORMAL HIGH (ref 70–99)
Glucose-Capillary: 243 mg/dL — ABNORMAL HIGH (ref 70–99)
Glucose-Capillary: 240 mg/dL — ABNORMAL HIGH (ref 70–99)

## 2020-10-19 LAB — SURGICAL PCR SCREEN
MRSA, PCR: NEGATIVE
Staphylococcus aureus: NEGATIVE

## 2020-10-19 LAB — HEPARIN LEVEL (UNFRACTIONATED): Heparin Unfractionated: 0.51 IU/mL (ref 0.30–0.70)

## 2020-10-19 MED ORDER — METOPROLOL TARTRATE 12.5 MG HALF TABLET
12.5000 mg | ORAL_TABLET | Freq: Once | ORAL | Status: AC
  Administered 2020-10-20: 12.5 mg via ORAL
  Filled 2020-10-19: qty 1

## 2020-10-19 MED ORDER — CHLORHEXIDINE GLUCONATE 0.12 % MT SOLN
15.0000 mL | Freq: Once | OROMUCOSAL | Status: AC
  Administered 2020-10-20: 15 mL via OROMUCOSAL
  Filled 2020-10-19: qty 15

## 2020-10-19 MED ORDER — CHLORHEXIDINE GLUCONATE CLOTH 2 % EX PADS
6.0000 | MEDICATED_PAD | Freq: Once | CUTANEOUS | Status: AC
  Administered 2020-10-20: 6 via TOPICAL

## 2020-10-19 MED ORDER — HYDRALAZINE HCL 50 MG PO TABS
50.0000 mg | ORAL_TABLET | Freq: Three times a day (TID) | ORAL | Status: DC
  Administered 2020-10-19: 50 mg via ORAL
  Filled 2020-10-19: qty 1

## 2020-10-19 MED ORDER — CHLORHEXIDINE GLUCONATE CLOTH 2 % EX PADS
6.0000 | MEDICATED_PAD | Freq: Once | CUTANEOUS | Status: AC
  Administered 2020-10-19: 6 via TOPICAL

## 2020-10-19 MED ORDER — TEMAZEPAM 15 MG PO CAPS
15.0000 mg | ORAL_CAPSULE | Freq: Once | ORAL | Status: AC | PRN
  Administered 2020-10-19: 15 mg via ORAL
  Filled 2020-10-19: qty 1

## 2020-10-19 MED ORDER — BISACODYL 5 MG PO TBEC: 5.0000 mg | DELAYED_RELEASE_TABLET | Freq: Once | ORAL | Status: DC

## 2020-10-19 MED ORDER — INSULIN GLARGINE 100 UNIT/ML ~~LOC~~ SOLN
30.0000 [IU] | Freq: Every day | SUBCUTANEOUS | Status: DC
  Administered 2020-10-19: 30 [IU] via SUBCUTANEOUS
  Filled 2020-10-19 (×2): qty 0.3

## 2020-10-19 NOTE — Progress Notes (Signed)
Progress Note  Patient Name: Dwayne Garrett Date of Encounter: 10/19/2020  Primary Cardiologist: Rozann Lesches, MD  Subjective   Currently feeling well without chest pain or shortness of breath  Inpatient Medications    Scheduled Meds: . amLODipine  10 mg Oral Daily  . aspirin  81 mg Oral Daily  . carvedilol  25 mg Oral BID WC  . gabapentin  300 mg Oral Daily  . insulin aspart  0-20 Units Subcutaneous TID WC  . insulin aspart  0-5 Units Subcutaneous QHS  . insulin glargine  20 Units Subcutaneous Daily  . lactulose  10 g Oral BID  . losartan  100 mg Oral Daily  . pantoprazole  40 mg Oral Daily  . rosuvastatin  20 mg Oral Daily  . sodium chloride flush  3 mL Intravenous Q12H  . spironolactone  25 mg Oral Daily   Continuous Infusions: . sodium chloride    . heparin 1,900 Units/hr (10/19/20 0458)   PRN Meds: sodium chloride, nitroGLYCERIN, ondansetron (ZOFRAN) IV, sodium chloride flush   Vital Signs    Vitals:   10/18/20 1708 10/18/20 2006 10/19/20 0610 10/19/20 0721  BP: (!) 166/91 123/73 (!) 170/91 (!) 171/98  Pulse: 61 (!) 58 65 63  Resp:  17 18   Temp:  (!) 97.5 F (36.4 C) 97.6 F (36.4 C)   TempSrc:  Oral Oral   SpO2:  98% 98%   Weight:   96.5 kg   Height:        Intake/Output Summary (Last 24 hours) at 10/19/2020 0800 Last data filed at 10/19/2020 6384 Gross per 24 hour  Intake 1518.19 ml  Output 2250 ml  Net -731.81 ml   Filed Weights   10/16/20 0509 10/17/20 0351 10/19/20 0610  Weight: 95.8 kg 96.3 kg 96.5 kg    Physical Exam   GEN: Well nourished, well developed, in no acute distress  HEENT: normal  Neck: no JVD, carotid bruits, or masses Cardiac: RRR; no murmurs, rubs, or gallops,no edema  Respiratory:  clear to auscultation bilaterally, normal work of breathing GI: soft, nontender, nondistended, + BS MS: no deformity or atrophy  Skin: warm and dry Neuro:  Strength and sensation are intact Psych: euthymic mood, full  affect    Labs    Chemistry Recent Labs  Lab 10/15/20 1142 10/16/20 0219 10/17/20 0049 10/18/20 0237 10/19/20 0143  NA  --    < > 133* 136 136  K  --    < > 3.5 3.6 3.6  CL  --    < > 100 101 102  CO2  --    < > 26 26 25   GLUCOSE  --    < > 297* 243* 365*  BUN  --    < > 13 12 13   CREATININE  --    < > 0.63 0.66 0.80  CALCIUM  --    < > 8.9 8.8* 9.1  PROT 6.7  --   --   --   --   ALBUMIN 3.4*  --   --   --   --   AST 98*  --   --   --   --   ALT 109*  --   --   --   --   ALKPHOS 60  --   --   --   --   BILITOT 1.4*  --   --   --   --   GFRNONAA  --    < > >  60 >60 >60  ANIONGAP  --    < > 7 9 9    < > = values in this interval not displayed.     Hematology Recent Labs  Lab 10/17/20 0049 10/18/20 0237 10/19/20 0143  WBC 7.3 7.3 6.9  RBC 4.69 4.63 4.59  HGB 14.4 14.2 13.9  HCT 42.3 42.4 42.4  MCV 90.2 91.6 92.4  MCH 30.7 30.7 30.3  MCHC 34.0 33.5 32.8  RDW 12.9 12.7 12.9  PLT 187 193 187    Cardiac EnzymesNo results for input(s): TROPONINI in the last 168 hours. No results for input(s): TROPIPOC in the last 168 hours.   BNPNo results for input(s): BNP, PROBNP in the last 168 hours.   DDimer No results for input(s): DDIMER in the last 168 hours.   Radiology    No results found. Telemetry    Sinus rhythm-Personally reviewed  ECG    None new  Cardiac Studies    LHC 10/15/20:   Prox Cx lesion is 30% stenosed.  Previously placed Mid RCA to Dist RCA drug eluting stent is widely patent.  Previously placed 3rd RPL drug eluting stent is widely patent.  Prox RCA lesion is 95% stenosed.  RPAV lesion is 99% stenosed.  RPDA lesion is 70% stenosed.  1st RPL lesion is 80% stenosed.  RV Branch lesion is 80% stenosed.  Mid LM lesion is 60% stenosed.  2nd Mrg lesion is 80% stenosed.  Prox LAD to Mid LAD lesion is 60% stenosed.  Previously placed 1st Diag-1 stent (unknown type) is widely patent.  1st Diag-2 lesion is 80% stenosed.  1.  Significant left main and three-vessel coronary artery disease. 2. Left ventricular angiography was not performed. Ejection fraction appeared normal by echo. Mildly elevated left ventricular end-diastolic pressure.  Recommendations: Recommend evaluation for CABG. Resume heparin 8 hours after sheath pull.  Echocardiogram 10/15/20:  1. Left ventricular ejection fraction, by estimation, is 60 to 65%. The  left ventricle has normal function. The left ventricle has no regional  wall motion abnormalities. There is moderate left ventricular hypertrophy.  Left ventricular diastolic  parameters are indeterminate.  2. Right ventricular systolic function is normal. The right ventricular  size is normal. There is mildly elevated pulmonary artery systolic  pressure. The estimated right ventricular systolic pressure is 38.7 mmHg.  3. The mitral valve is normal in structure. Trivial mitral valve  regurgitation.  4. The aortic valve is tricuspid. Aortic valve regurgitation is not  visualized. No aortic stenosis is present.  5. The inferior vena cava is normal in size with greater than 50%  respiratory variability, suggesting right atrial pressure of 3 mmHg.    Left Cardiac Catheterization 03/11/2017:  Unstable angina/non-ST elevation myocardial infarction presentation.  The previously stented proximal to mid RCA is widely patent  De novo 95-99% distal RCA before the origin of the PDA.  De novo 95% stenosis in the third left ventricular branch of the right coronary artery.  Moderate obstruction in the trunk of the left main, 40%. This is unchanged from prior angiogram.  Moderate eccentric 40-50% proximal to mid LAD. Diffuse 30-50% disease in the mid and distal LAD. The largest previously stented diagonal reveals no evidence of in-stent restenosis.  Normal left ventricular size and function with mildly elevated LVEDP consistent with diastolic heart failure, asymptomatic. EF  60%.  Successful stenting of the distal RCA and third left ventricular branch reducing stenoses to 0% and 0% with TIMI grade 3 flow. Onyx 3.5 x 18 and  2.25 x 15 DES deployed at 15 atm in distal RCA and third LV branch respectively. TIMI grade 3 flow is noted beyond age treatment area.  Recommendations:  This gentleman needs aggressive risk factor modification he has moderate to moderately severe left main and LAD disease although I do not believe they are critical and surgical therapy at this time would risk graft occlusion because native flow is not impaired.  Aspirin and Brilinta 12 months  Hopeful discharge in a.m.   Patient Profile     65 y.o. male with a history of multivessel CAD s/p multiple PCIs, hypertension, hyperlipidemia, type 2 diabetes mellitus, obstructive sleep apnea(CPAP machine broken), cirrhosis, and Castleman's disease who was transferred from [facility] for further management of NSTEMI after presenting there with chest pain.  Assessment & Plan    1.  Non-STEMI with coronary artery disease: Has multiple stents in the past.  Severe left main and RCA disease.  CABG planned for Monday.  We Susanne Baumgarner continue heparin through the day today due to severe disease.    2.  Hypertension: Blood pressure significantly elevated today.  Jacaden Forbush start hydralazine.  His blood pressure medicines Cullen Vanallen likely need to be adjusted post CABG.  3.  Hyperlipidemia: Goal LDL less than 70.  Currently on Crestor.  Westlee Devita need lipid clinic follow-up after discharge.    4.  Type 2 diabetes: A1c of 12.4.  Currently on sliding scale insulin as an inpatient.  Diabetes coordinator recommendations appreciated.    5.  Cirrhosis: Currently on lactulose  6.  Hematochezia: Hemoglobin stable.   7.  Tobacco abuse: Cessation strongly encouraged   Signed, Laurielle Selmon Curt Bears, MD [facility] 10/19/2020, 8:00 AM     For questions or updates, please contact   Please consult [email or web address] for contact info under  Cardiology/STEMI.

## 2020-10-19 NOTE — Anesthesia Preprocedure Evaluation (Addendum)
Anesthesia Evaluation  Patient identified by MRN, date of birth, ID band Patient awake    Reviewed: Allergy & Precautions, NPO status , Patient's Chart, lab work & pertinent test results, reviewed documented beta blocker date and time   Airway Mallampati: II  TM Distance: >3 FB Neck ROM: Full    Dental  (+) Missing Full beard:   Pulmonary sleep apnea and Continuous Positive Airway Pressure Ventilation , Current Smoker and Patient abstained from smoking.,    Pulmonary exam normal breath sounds clear to auscultation       Cardiovascular hypertension, Pt. on medications and Pt. on home beta blockers + CAD and + Past MI  Normal cardiovascular exam Rhythm:Regular Rate:Normal     Neuro/Psych negative neurological ROS     GI/Hepatic GERD  Medicated and Controlled,(+) Cirrhosis       ,   Endo/Other  diabetes, Oral Hypoglycemic Agents  Renal/GU negative Renal ROS     Musculoskeletal negative musculoskeletal ROS (+)   Abdominal (+) + obese,   Peds  Hematology negative hematology ROS (+)   Anesthesia Other Findings CAD  Reproductive/Obstetrics                            Anesthesia Physical Anesthesia Plan  ASA: IV  Anesthesia Plan: General   Post-op Pain Management:    Induction: Intravenous  PONV Risk Score and Plan: 1 and Ondansetron, Dexamethasone, Midazolam and Treatment may vary due to age or medical condition  Airway Management Planned: Oral ETT  Additional Equipment: Arterial line, CVP, TEE and Ultrasound Guidance Line Placement  Intra-op Plan:   Post-operative Plan: Post-operative intubation/ventilation  Informed Consent: I have reviewed the patients History and Physical, chart, labs and discussed the procedure including the risks, benefits and alternatives for the proposed anesthesia with the patient or authorized representative who has indicated his/her understanding and  acceptance.     Dental advisory given  Plan Discussed with: CRNA  Anesthesia Plan Comments:        Anesthesia Quick Evaluation

## 2020-10-19 NOTE — Progress Notes (Signed)
     [address]       [place] [address]             [phone]       OR tomorrow for CABG  Dwayne Garrett

## 2020-10-19 NOTE — Progress Notes (Signed)
ANTICOAGULATION CONSULT NOTE  Pharmacy Consult for Heparin  Indication: chest pain/ACS  Patient Measurements: Height: 5' 9"  (175.3 cm) Weight: 96.5 kg (212 lb 12.8 oz) IBW/kg (Calculated) : 70.7  Heparin Dosing Wt: 90.4 kg  Vital Signs: Temp: 97.6 F (36.4 C) (05/01 0610) Temp Source: Oral (05/01 0610) BP: 171/98 (05/01 0721) Pulse Rate: 63 (05/01 0721)  Labs: Recent Labs    10/16/20 1017 10/16/20 1620 10/17/20 0049 10/17/20 0744 10/18/20 0237 10/18/20 1004 10/18/20 1703 10/19/20 0143  HGB  --    < > 14.4  --  14.2  --   --  13.9  HCT  --   --  42.3  --  42.4  --   --  42.4  PLT  --   --  187  --  193  --   --  187  APTT 34  --   --   --   --   --   --   --   LABPROT 12.9  --   --   --   --   --   --   --   INR 1.0  --   --   --   --   --   --   --   HEPARINUNFRC  --    < > 0.32   < > 0.27* 0.25* 0.41 0.51  CREATININE  --   --  0.63  --  0.66  --   --  0.80   < > = values in this interval not displayed.    Estimated Creatinine Clearance: 108.3 mL/min (by C-G formula based on SCr of 0.8 mg/dL).  Assessment: Dwayne Garrett is a 65 y.o. male who presents to the ED with intermittent chest pain this past evening. Found to have mildly elevated troponin. Starting heparin per pharmacy. CBC/renal function good. PTA meds reviewed.   S/p cath lab with 3v CAD and w/u for CABG - earliest available date is Monday, May 2.   Heparin level remains therapeutic on rate at 1900 units/hr. No s/sx bleeding or issues with infusion per RN. CBC stable/wnl.   Goal of Therapy:  Heparin level 0.3-0.7 units/ml Monitor platelets by anticoagulation protocol: Yes   Plan:  Continue IV heparin 1900 units/hr  Daily heparin level, CBC while on heparin  Rebbeca Paul, PharmD PGY1 Pharmacy Resident 10/19/2020 7:31 AM  Please check [email or web address] for unit-specific pharmacy phone numbers.

## 2020-10-20 ENCOUNTER — Inpatient Hospital Stay (HOSPITAL_COMMUNITY): Payer: No Typology Code available for payment source | Admitting: Anesthesiology

## 2020-10-20 ENCOUNTER — Inpatient Hospital Stay (HOSPITAL_COMMUNITY): Payer: No Typology Code available for payment source

## 2020-10-20 ENCOUNTER — Inpatient Hospital Stay (HOSPITAL_COMMUNITY)
Admission: EM | Disposition: A | Discharge status: Home / Self Care | Payer: Self-pay | Source: Home / Self Care | Attending: Cardiovascular Disease

## 2020-10-20 ENCOUNTER — Encounter (HOSPITAL_COMMUNITY): Payer: Self-pay | Admitting: Thoracic Surgery (Cardiothoracic Vascular Surgery)

## 2020-10-20 ENCOUNTER — Inpatient Hospital Stay (HOSPITAL_COMMUNITY)
Admission: RE | Admit: 2020-10-20 | Payer: No Typology Code available for payment source | Source: Home / Self Care | Admitting: Thoracic Surgery (Cardiothoracic Vascular Surgery)

## 2020-10-20 DIAGNOSIS — I25110 Atherosclerotic heart disease of native coronary artery with unstable angina pectoris: Secondary | ICD-10-CM

## 2020-10-20 DIAGNOSIS — Z951 Presence of aortocoronary bypass graft: Secondary | ICD-10-CM

## 2020-10-20 DIAGNOSIS — I2510 Atherosclerotic heart disease of native coronary artery without angina pectoris: Secondary | ICD-10-CM | POA: Diagnosis present

## 2020-10-20 HISTORY — PX: CORONARY ARTERY BYPASS GRAFT: SHX141

## 2020-10-20 HISTORY — PX: TEE WITHOUT CARDIOVERSION: SHX5443

## 2020-10-20 LAB — POCT I-STAT EG7
Acid-Base Excess: 3 mmol/L — ABNORMAL HIGH (ref 0.0–2.0)
Bicarbonate: 28.7 mmol/L — ABNORMAL HIGH (ref 20.0–28.0)
Calcium, Ion: 1.14 mmol/L — ABNORMAL LOW (ref 1.15–1.40)
HCT: 31 % — ABNORMAL LOW (ref 39.0–52.0)
Hemoglobin: 10.5 g/dL — ABNORMAL LOW (ref 13.0–17.0)
O2 Saturation: 77 %
Potassium: 3.7 mmol/L (ref 3.5–5.1)
Sodium: 142 mmol/L (ref 135–145)
TCO2: 30 mmol/L (ref 22–32)
pCO2, Ven: 47.8 mmHg (ref 44.0–60.0)
pH, Ven: 7.386 (ref 7.250–7.430)
pO2, Ven: 43 mmHg (ref 32.0–45.0)

## 2020-10-20 LAB — ECHO INTRAOPERATIVE TEE
AR max vel: 2.72 cm2
AV Area VTI: 2.45 cm2
AV Area mean vel: 2.53 cm2
AV Mean grad: 5 mmHg
AV Peak grad: 9.4 mmHg
Ao pk vel: 1.53 m/s
Height: 69 in
S' Lateral: 2.6 cm
Weight: 3393.6 oz

## 2020-10-20 LAB — CBC
HCT: 42.5 % (ref 39.0–52.0)
HCT: 38.3 % — ABNORMAL LOW (ref 39.0–52.0)
HCT: 39.5 % (ref 39.0–52.0)
Hemoglobin: 14 g/dL (ref 13.0–17.0)
Hemoglobin: 12.6 g/dL — ABNORMAL LOW (ref 13.0–17.0)
Hemoglobin: 13 g/dL (ref 13.0–17.0)
MCH: 30.1 pg (ref 26.0–34.0)
MCH: 30.7 pg (ref 26.0–34.0)
MCH: 30.6 pg (ref 26.0–34.0)
MCHC: 32.9 g/dL (ref 30.0–36.0)
MCHC: 32.9 g/dL (ref 30.0–36.0)
MCHC: 32.9 g/dL (ref 30.0–36.0)
MCV: 91.4 fL (ref 80.0–100.0)
MCV: 93.2 fL (ref 80.0–100.0)
MCV: 92.9 fL (ref 80.0–100.0)
Platelets: 197 10*3/uL (ref 150–400)
Platelets: 137 10*3/uL — ABNORMAL LOW (ref 150–400)
Platelets: 157 10*3/uL (ref 150–400)
RBC: 4.65 MIL/uL (ref 4.22–5.81)
RBC: 4.11 MIL/uL — ABNORMAL LOW (ref 4.22–5.81)
RBC: 4.25 MIL/uL (ref 4.22–5.81)
RDW: 13 % (ref 11.5–15.5)
RDW: 12.9 % (ref 11.5–15.5)
RDW: 13.1 % (ref 11.5–15.5)
WBC: 7.5 10*3/uL (ref 4.0–10.5)
WBC: 9.9 10*3/uL (ref 4.0–10.5)
WBC: 13.1 10*3/uL — ABNORMAL HIGH (ref 4.0–10.5)
nRBC: 0 % (ref 0.0–0.2)
nRBC: 0 % (ref 0.0–0.2)
nRBC: 0 % (ref 0.0–0.2)

## 2020-10-20 LAB — BASIC METABOLIC PANEL
Anion gap: 9 (ref 5–15)
Anion gap: 5 (ref 5–15)
BUN: 11 mg/dL (ref 8–23)
BUN: 8 mg/dL (ref 8–23)
CO2: 29 mmol/L (ref 22–32)
CO2: 24 mmol/L (ref 22–32)
Calcium: 9.3 mg/dL (ref 8.9–10.3)
Calcium: 8.3 mg/dL — ABNORMAL LOW (ref 8.9–10.3)
Chloride: 102 mmol/L (ref 98–111)
Chloride: 111 mmol/L (ref 98–111)
Creatinine, Ser: 0.67 mg/dL (ref 0.61–1.24)
Creatinine, Ser: 0.56 mg/dL — ABNORMAL LOW (ref 0.61–1.24)
GFR, Estimated: >60 mL/min (ref >60)
GFR, Estimated: >60 mL/min (ref >60)
Glucose, Bld: 185 mg/dL — ABNORMAL HIGH (ref 70–99)
Glucose, Bld: 121 mg/dL — ABNORMAL HIGH (ref 70–99)
Potassium: 3.7 mmol/L (ref 3.5–5.1)
Potassium: 3.9 mmol/L (ref 3.5–5.1)
Sodium: 140 mmol/L (ref 135–145)
Sodium: 140 mmol/L (ref 135–145)

## 2020-10-20 LAB — POCT I-STAT 7, (LYTES, BLD GAS, ICA,H+H)
Acid-Base Excess: 4 mmol/L — ABNORMAL HIGH (ref 0.0–2.0)
Acid-Base Excess: 5 mmol/L — ABNORMAL HIGH (ref 0.0–2.0)
Acid-Base Excess: 1 mmol/L (ref 0.0–2.0)
Bicarbonate: 29.5 mmol/L — ABNORMAL HIGH (ref 20.0–28.0)
Bicarbonate: 29.1 mmol/L — ABNORMAL HIGH (ref 20.0–28.0)
Bicarbonate: 25.5 mmol/L (ref 20.0–28.0)
Calcium, Ion: 1.07 mmol/L — ABNORMAL LOW (ref 1.15–1.40)
Calcium, Ion: 1.15 mmol/L (ref 1.15–1.40)
Calcium, Ion: 1.4 mmol/L (ref 1.15–1.40)
HCT: 31 % — ABNORMAL LOW (ref 39.0–52.0)
HCT: 28 % — ABNORMAL LOW (ref 39.0–52.0)
HCT: 31 % — ABNORMAL LOW (ref 39.0–52.0)
Hemoglobin: 10.5 g/dL — ABNORMAL LOW (ref 13.0–17.0)
Hemoglobin: 9.5 g/dL — ABNORMAL LOW (ref 13.0–17.0)
Hemoglobin: 10.5 g/dL — ABNORMAL LOW (ref 13.0–17.0)
O2 Saturation: 100 %
O2 Saturation: 100 %
O2 Saturation: 98 %
Potassium: 4 mmol/L (ref 3.5–5.1)
Potassium: 4.2 mmol/L (ref 3.5–5.1)
Potassium: 3.6 mmol/L (ref 3.5–5.1)
Sodium: 142 mmol/L (ref 135–145)
Sodium: 139 mmol/L (ref 135–145)
Sodium: 141 mmol/L (ref 135–145)
TCO2: 31 mmol/L (ref 22–32)
TCO2: 30 mmol/L (ref 22–32)
TCO2: 27 mmol/L (ref 22–32)
pCO2 arterial: 46 mmHg (ref 32.0–48.0)
pCO2 arterial: 39 mmHg (ref 32.0–48.0)
pCO2 arterial: 41.4 mmHg (ref 32.0–48.0)
pH, Arterial: 7.415 (ref 7.350–7.450)
pH, Arterial: 7.481 — ABNORMAL HIGH (ref 7.350–7.450)
pH, Arterial: 7.398 (ref 7.350–7.450)
pO2, Arterial: 347 mmHg — ABNORMAL HIGH (ref 83.0–108.0)
pO2, Arterial: 317 mmHg — ABNORMAL HIGH (ref 83.0–108.0)
pO2, Arterial: 101 mmHg (ref 83.0–108.0)

## 2020-10-20 LAB — PROTIME-INR
INR: 1.2 (ref 0.8–1.2)
Prothrombin Time: 15.4 seconds — ABNORMAL HIGH (ref 11.4–15.2)

## 2020-10-20 LAB — POCT I-STAT, CHEM 8
BUN: 12 mg/dL (ref 8–23)
BUN: 10 mg/dL (ref 8–23)
BUN: 10 mg/dL (ref 8–23)
BUN: 10 mg/dL (ref 8–23)
BUN: 10 mg/dL (ref 8–23)
Calcium, Ion: 1.29 mmol/L (ref 1.15–1.40)
Calcium, Ion: 1.25 mmol/L (ref 1.15–1.40)
Calcium, Ion: 1.13 mmol/L — ABNORMAL LOW (ref 1.15–1.40)
Calcium, Ion: 1.17 mmol/L (ref 1.15–1.40)
Calcium, Ion: 1.42 mmol/L — ABNORMAL HIGH (ref 1.15–1.40)
Chloride: 102 mmol/L (ref 98–111)
Chloride: 103 mmol/L (ref 98–111)
Chloride: 103 mmol/L (ref 98–111)
Chloride: 104 mmol/L (ref 98–111)
Chloride: 106 mmol/L (ref 98–111)
Creatinine, Ser: 0.5 mg/dL — ABNORMAL LOW (ref 0.61–1.24)
Creatinine, Ser: 0.4 mg/dL — ABNORMAL LOW (ref 0.61–1.24)
Creatinine, Ser: 0.4 mg/dL — ABNORMAL LOW (ref 0.61–1.24)
Creatinine, Ser: 0.4 mg/dL — ABNORMAL LOW (ref 0.61–1.24)
Creatinine, Ser: 0.5 mg/dL — ABNORMAL LOW (ref 0.61–1.24)
Glucose, Bld: 176 mg/dL — ABNORMAL HIGH (ref 70–99)
Glucose, Bld: 136 mg/dL — ABNORMAL HIGH (ref 70–99)
Glucose, Bld: 101 mg/dL — ABNORMAL HIGH (ref 70–99)
Glucose, Bld: 123 mg/dL — ABNORMAL HIGH (ref 70–99)
Glucose, Bld: 128 mg/dL — ABNORMAL HIGH (ref 70–99)
HCT: 39 % (ref 39.0–52.0)
HCT: 36 % — ABNORMAL LOW (ref 39.0–52.0)
HCT: 30 % — ABNORMAL LOW (ref 39.0–52.0)
HCT: 29 % — ABNORMAL LOW (ref 39.0–52.0)
HCT: 33 % — ABNORMAL LOW (ref 39.0–52.0)
Hemoglobin: 13.3 g/dL (ref 13.0–17.0)
Hemoglobin: 12.2 g/dL — ABNORMAL LOW (ref 13.0–17.0)
Hemoglobin: 10.2 g/dL — ABNORMAL LOW (ref 13.0–17.0)
Hemoglobin: 11.2 g/dL — ABNORMAL LOW (ref 13.0–17.0)
Hemoglobin: 9.9 g/dL — ABNORMAL LOW (ref 13.0–17.0)
Potassium: 3.8 mmol/L (ref 3.5–5.1)
Potassium: 3.6 mmol/L (ref 3.5–5.1)
Potassium: 4.3 mmol/L (ref 3.5–5.1)
Potassium: 3.6 mmol/L (ref 3.5–5.1)
Potassium: 4.3 mmol/L (ref 3.5–5.1)
Sodium: 142 mmol/L (ref 135–145)
Sodium: 141 mmol/L (ref 135–145)
Sodium: 140 mmol/L (ref 135–145)
Sodium: 139 mmol/L (ref 135–145)
Sodium: 141 mmol/L (ref 135–145)
TCO2: 30 mmol/L (ref 22–32)
TCO2: 26 mmol/L (ref 22–32)
TCO2: 27 mmol/L (ref 22–32)
TCO2: 24 mmol/L (ref 22–32)
TCO2: 28 mmol/L (ref 22–32)

## 2020-10-20 LAB — GLUCOSE, CAPILLARY
Glucose-Capillary: 203 mg/dL — ABNORMAL HIGH (ref 70–99)
Glucose-Capillary: 102 mg/dL — ABNORMAL HIGH (ref 70–99)
Glucose-Capillary: 128 mg/dL — ABNORMAL HIGH (ref 70–99)
Glucose-Capillary: 114 mg/dL — ABNORMAL HIGH (ref 70–99)
Glucose-Capillary: 139 mg/dL — ABNORMAL HIGH (ref 70–99)
Glucose-Capillary: 125 mg/dL — ABNORMAL HIGH (ref 70–99)
Glucose-Capillary: 133 mg/dL — ABNORMAL HIGH (ref 70–99)

## 2020-10-20 LAB — PLATELET COUNT: Platelets: 134 10*3/uL — ABNORMAL LOW (ref 150–400)

## 2020-10-20 LAB — HEMOGLOBIN AND HEMATOCRIT, BLOOD
HCT: 29.8 % — ABNORMAL LOW (ref 39.0–52.0)
Hemoglobin: 10.1 g/dL — ABNORMAL LOW (ref 13.0–17.0)

## 2020-10-20 LAB — PREPARE RBC (CROSSMATCH)

## 2020-10-20 LAB — MAGNESIUM: Magnesium: 2.7 mg/dL — ABNORMAL HIGH (ref 1.7–2.4)

## 2020-10-20 LAB — HEPARIN LEVEL (UNFRACTIONATED): Heparin Unfractionated: 0.11 IU/mL — ABNORMAL LOW (ref 0.30–0.70)

## 2020-10-20 LAB — APTT: aPTT: 28 seconds (ref 24–36)

## 2020-10-20 SURGERY — CORONARY ARTERY BYPASS GRAFTING (CABG)
Anesthesia: General | Site: Esophagus

## 2020-10-20 MED ORDER — LACTATED RINGERS IV SOLN: INTRAVENOUS | Status: DC

## 2020-10-20 MED ORDER — METOPROLOL TARTRATE 5 MG/5ML IV SOLN
2.5000 mg | INTRAVENOUS | Status: DC | PRN
  Administered 2020-10-23: 5 mg via INTRAVENOUS
  Filled 2020-10-20: qty 5

## 2020-10-20 MED ORDER — ROCURONIUM BROMIDE 10 MG/ML (PF) SYRINGE
PREFILLED_SYRINGE | INTRAVENOUS | Status: AC
  Filled 2020-10-20: qty 20

## 2020-10-20 MED ORDER — SODIUM CHLORIDE 0.9 % IV SOLN
INTRAVENOUS | Status: DC | PRN
  Administered 2020-10-20: 1.5 g via INTRAVENOUS

## 2020-10-20 MED ORDER — PROPOFOL 10 MG/ML IV BOLUS
INTRAVENOUS | Status: DC | PRN
  Administered 2020-10-20: 70 mg via INTRAVENOUS
  Administered 2020-10-20: 30 mg via INTRAVENOUS

## 2020-10-20 MED ORDER — DEXTROSE 50 % IV SOLN
0.0000 mL | INTRAVENOUS | Status: DC | PRN
Start: 2020-10-20 — End: 2020-10-24

## 2020-10-20 MED ORDER — SODIUM CHLORIDE 0.9 % IV SOLN
1.5000 g | Freq: Two times a day (BID) | INTRAVENOUS | Status: DC
  Administered 2020-10-20 – 2020-10-21 (×2): 1.5 g via INTRAVENOUS
  Filled 2020-10-20 (×4): qty 1.5

## 2020-10-20 MED ORDER — ACETAMINOPHEN 650 MG RE SUPP: 650.0000 mg | Freq: Once | RECTAL | Status: DC

## 2020-10-20 MED ORDER — ASPIRIN 81 MG PO CHEW: 324.0000 mg | CHEWABLE_TABLET | Freq: Every day | ORAL | Status: DC

## 2020-10-20 MED ORDER — NITROGLYCERIN IN D5W 200-5 MCG/ML-% IV SOLN: 0.0000 ug/min | INTRAVENOUS | Status: DC

## 2020-10-20 MED ORDER — MIDAZOLAM HCL (PF) 10 MG/2ML IJ SOLN
INTRAMUSCULAR | Status: AC
  Filled 2020-10-20: qty 2

## 2020-10-20 MED ORDER — DEXMEDETOMIDINE HCL IN NACL 400 MCG/100ML IV SOLN
INTRAVENOUS | Status: DC | PRN
  Administered 2020-10-20: .7 ug/kg/h via INTRAVENOUS

## 2020-10-20 MED ORDER — BISACODYL 10 MG RE SUPP: 10.0000 mg | Freq: Every day | RECTAL | Status: DC

## 2020-10-20 MED ORDER — PLASMA-LYTE 148 IV SOLN
INTRAVENOUS | Status: DC | PRN
  Administered 2020-10-20: 500 mL

## 2020-10-20 MED ORDER — DEXMEDETOMIDINE HCL IN NACL 400 MCG/100ML IV SOLN
0.0000 ug/kg/h | INTRAVENOUS | Status: DC
Start: 2020-10-20 — End: 2020-10-21

## 2020-10-20 MED ORDER — ASPIRIN EC 325 MG PO TBEC
325.0000 mg | DELAYED_RELEASE_TABLET | Freq: Every day | ORAL | Status: DC
  Administered 2020-10-21 – 2020-10-24 (×4): 325 mg via ORAL
  Filled 2020-10-20 (×4): qty 1

## 2020-10-20 MED ORDER — MIDAZOLAM HCL (PF) 5 MG/ML IJ SOLN
INTRAMUSCULAR | Status: DC | PRN
  Administered 2020-10-20: 2 mg via INTRAVENOUS
  Administered 2020-10-20: 5 mg via INTRAVENOUS
  Administered 2020-10-20: 3 mg via INTRAVENOUS

## 2020-10-20 MED ORDER — FENTANYL CITRATE (PF) 250 MCG/5ML IJ SOLN
INTRAMUSCULAR | Status: DC | PRN
  Administered 2020-10-20 (×5): 250 ug via INTRAVENOUS

## 2020-10-20 MED ORDER — PHENYLEPHRINE HCL-NACL 20-0.9 MG/250ML-% IV SOLN: 0.0000 ug/min | INTRAVENOUS | Status: DC

## 2020-10-20 MED ORDER — CHLORHEXIDINE GLUCONATE 0.12 % MT SOLN
15.0000 mL | OROMUCOSAL | Status: AC
  Administered 2020-10-20: 15 mL via OROMUCOSAL

## 2020-10-20 MED ORDER — LACTATED RINGERS IV SOLN: 500.0000 mL | Freq: Once | INTRAVENOUS | Status: DC | PRN

## 2020-10-20 MED ORDER — SODIUM CHLORIDE (PF) 0.9 % IJ SOLN
INTRAMUSCULAR | Status: AC
  Filled 2020-10-20: qty 10

## 2020-10-20 MED ORDER — SODIUM CHLORIDE 0.9 % IV SOLN: 250.0000 mL | INTRAVENOUS | Status: DC

## 2020-10-20 MED ORDER — SODIUM CHLORIDE 0.9 % IV SOLN
INTRAVENOUS | Status: DC | PRN
  Administered 2020-10-20: 750 mg via INTRAVENOUS

## 2020-10-20 MED ORDER — PHENYLEPHRINE HCL-NACL 20-0.9 MG/250ML-% IV SOLN
INTRAVENOUS | Status: DC | PRN
  Administered 2020-10-20: 15 ug/min via INTRAVENOUS

## 2020-10-20 MED ORDER — METOPROLOL TARTRATE 12.5 MG HALF TABLET
12.5000 mg | ORAL_TABLET | Freq: Two times a day (BID) | ORAL | Status: DC
  Administered 2020-10-20 – 2020-10-21 (×2): 12.5 mg via ORAL
  Filled 2020-10-20 (×2): qty 1

## 2020-10-20 MED ORDER — VANCOMYCIN HCL IN DEXTROSE 1-5 GM/200ML-% IV SOLN
1000.0000 mg | Freq: Once | INTRAVENOUS | Status: AC
  Administered 2020-10-20: 1000 mg via INTRAVENOUS
  Filled 2020-10-20: qty 200

## 2020-10-20 MED ORDER — EPHEDRINE SULFATE-NACL 50-0.9 MG/10ML-% IV SOSY
PREFILLED_SYRINGE | INTRAVENOUS | Status: DC | PRN
  Administered 2020-10-20: 5 mg via INTRAVENOUS
  Administered 2020-10-20: 7.5 mg via INTRAVENOUS

## 2020-10-20 MED ORDER — GLYCOPYRROLATE 0.2 MG/ML IJ SOLN
INTRAMUSCULAR | Status: DC | PRN
  Administered 2020-10-20: .2 mg via INTRAVENOUS

## 2020-10-20 MED ORDER — METOPROLOL TARTRATE 25 MG/10 ML ORAL SUSPENSION: 12.5000 mg | Freq: Two times a day (BID) | ORAL | Status: DC

## 2020-10-20 MED ORDER — MORPHINE SULFATE (PF) 2 MG/ML IV SOLN
1.0000 mg | INTRAVENOUS | Status: DC | PRN
  Administered 2020-10-20 – 2020-10-21 (×4): 2 mg via INTRAVENOUS
  Filled 2020-10-20 (×5): qty 1

## 2020-10-20 MED ORDER — ACETAMINOPHEN 160 MG/5ML PO SOLN: 650.0000 mg | Freq: Once | ORAL | Status: DC

## 2020-10-20 MED ORDER — EPHEDRINE 5 MG/ML INJ
INTRAVENOUS | Status: AC
  Filled 2020-10-20: qty 10

## 2020-10-20 MED ORDER — PROTAMINE SULFATE 10 MG/ML IV SOLN
INTRAVENOUS | Status: DC | PRN
  Administered 2020-10-20: 90 mg via INTRAVENOUS
  Administered 2020-10-20: 10 mg via INTRAVENOUS
  Administered 2020-10-20 (×2): 100 mg via INTRAVENOUS

## 2020-10-20 MED ORDER — ONDANSETRON HCL 4 MG/2ML IJ SOLN
4.0000 mg | Freq: Four times a day (QID) | INTRAMUSCULAR | Status: DC | PRN
  Administered 2020-10-20 – 2020-10-21 (×2): 4 mg via INTRAVENOUS
  Filled 2020-10-20 (×2): qty 2

## 2020-10-20 MED ORDER — SODIUM CHLORIDE 0.9% FLUSH
3.0000 mL | INTRAVENOUS | Status: DC | PRN
  Administered 2020-10-21 – 2020-10-23 (×2): 3 mL via INTRAVENOUS

## 2020-10-20 MED ORDER — POTASSIUM CHLORIDE 10 MEQ/50ML IV SOLN
10.0000 meq | INTRAVENOUS | Status: AC
  Administered 2020-10-20 (×3): 10 meq via INTRAVENOUS

## 2020-10-20 MED ORDER — SODIUM CHLORIDE (PF) 0.9 % IJ SOLN
OROMUCOSAL | Status: DC | PRN
  Administered 2020-10-20 (×2): 4 mL via TOPICAL

## 2020-10-20 MED ORDER — SODIUM CHLORIDE 0.45 % IV SOLN
INTRAVENOUS | Status: DC | PRN
  Administered 2020-10-20: 20 mL/h via INTRAVENOUS

## 2020-10-20 MED ORDER — PROPOFOL 10 MG/ML IV BOLUS
INTRAVENOUS | Status: AC
  Filled 2020-10-20: qty 20

## 2020-10-20 MED ORDER — SODIUM CHLORIDE 0.9 % IV SOLN: INTRAVENOUS | Status: DC

## 2020-10-20 MED ORDER — ACETAMINOPHEN 160 MG/5ML PO SOLN: 1000.0000 mg | Freq: Four times a day (QID) | ORAL | Status: DC

## 2020-10-20 MED ORDER — HEPARIN SODIUM (PORCINE) 1000 UNIT/ML IJ SOLN
INTRAMUSCULAR | Status: DC | PRN
  Administered 2020-10-20: 30000 [IU] via INTRAVENOUS

## 2020-10-20 MED ORDER — ALBUMIN HUMAN 5 % IV SOLN
250.0000 mL | INTRAVENOUS | Status: AC | PRN
  Administered 2020-10-20: 12.5 g via INTRAVENOUS

## 2020-10-20 MED ORDER — CHLORHEXIDINE GLUCONATE CLOTH 2 % EX PADS
6.0000 | MEDICATED_PAD | Freq: Every day | CUTANEOUS | Status: DC
  Administered 2020-10-20 – 2020-10-22 (×3): 6 via TOPICAL

## 2020-10-20 MED ORDER — PROTAMINE SULFATE 10 MG/ML IV SOLN
INTRAVENOUS | Status: AC
  Filled 2020-10-20: qty 25

## 2020-10-20 MED ORDER — LACTATED RINGERS IV SOLN: INTRAVENOUS | Status: DC | PRN

## 2020-10-20 MED ORDER — MIDAZOLAM HCL 2 MG/2ML IJ SOLN: 2.0000 mg | INTRAMUSCULAR | Status: DC | PRN

## 2020-10-20 MED ORDER — ROCURONIUM BROMIDE 10 MG/ML (PF) SYRINGE
PREFILLED_SYRINGE | INTRAVENOUS | Status: DC | PRN
  Administered 2020-10-20: 50 mg via INTRAVENOUS
  Administered 2020-10-20: 100 mg via INTRAVENOUS
  Administered 2020-10-20 (×2): 50 mg via INTRAVENOUS

## 2020-10-20 MED ORDER — TRAMADOL HCL 50 MG PO TABS
50.0000 mg | ORAL_TABLET | ORAL | Status: DC | PRN
  Administered 2020-10-20: 50 mg via ORAL
  Filled 2020-10-20: qty 1

## 2020-10-20 MED ORDER — HEMOSTATIC AGENTS (NO CHARGE) OPTIME
TOPICAL | Status: DC | PRN
  Administered 2020-10-20: 1 via TOPICAL

## 2020-10-20 MED ORDER — PANTOPRAZOLE SODIUM 40 MG PO TBEC
40.0000 mg | DELAYED_RELEASE_TABLET | Freq: Every day | ORAL | Status: DC
  Administered 2020-10-22 – 2020-10-24 (×3): 40 mg via ORAL
  Filled 2020-10-20 (×3): qty 1

## 2020-10-20 MED ORDER — OXYCODONE HCL 5 MG PO TABS
5.0000 mg | ORAL_TABLET | ORAL | Status: DC | PRN
  Administered 2020-10-20 – 2020-10-21 (×3): 10 mg via ORAL
  Filled 2020-10-20 (×3): qty 2

## 2020-10-20 MED ORDER — SODIUM CHLORIDE 0.9% FLUSH
3.0000 mL | Freq: Two times a day (BID) | INTRAVENOUS | Status: DC
  Administered 2020-10-21 – 2020-10-23 (×4): 3 mL via INTRAVENOUS

## 2020-10-20 MED ORDER — INSULIN REGULAR(HUMAN) IN NACL 100-0.9 UT/100ML-% IV SOLN
INTRAVENOUS | Status: DC | PRN
  Administered 2020-10-20: 6.5 [IU]/h via INTRAVENOUS

## 2020-10-20 MED ORDER — ALBUMIN HUMAN 5 % IV SOLN: INTRAVENOUS | Status: DC | PRN

## 2020-10-20 MED ORDER — FENTANYL CITRATE (PF) 250 MCG/5ML IJ SOLN
INTRAMUSCULAR | Status: AC
  Filled 2020-10-20: qty 5

## 2020-10-20 MED ORDER — BISACODYL 5 MG PO TBEC
10.0000 mg | DELAYED_RELEASE_TABLET | Freq: Every day | ORAL | Status: DC
  Administered 2020-10-21 – 2020-10-24 (×3): 10 mg via ORAL
  Filled 2020-10-20 (×3): qty 2

## 2020-10-20 MED ORDER — LIDOCAINE 2% (20 MG/ML) 5 ML SYRINGE
INTRAMUSCULAR | Status: AC
  Filled 2020-10-20: qty 5

## 2020-10-20 MED ORDER — FAMOTIDINE IN NACL 20-0.9 MG/50ML-% IV SOLN
20.0000 mg | Freq: Two times a day (BID) | INTRAVENOUS | Status: AC
  Administered 2020-10-20 (×2): 20 mg via INTRAVENOUS
  Filled 2020-10-20 (×3): qty 50

## 2020-10-20 MED ORDER — INSULIN REGULAR(HUMAN) IN NACL 100-0.9 UT/100ML-% IV SOLN: INTRAVENOUS | Status: DC

## 2020-10-20 MED ORDER — VANCOMYCIN HCL 1000 MG IV SOLR
INTRAVENOUS | Status: DC | PRN
  Administered 2020-10-20: 1500 mg via INTRAVENOUS

## 2020-10-20 MED ORDER — TRANEXAMIC ACID 1000 MG/10ML IV SOLN
INTRAVENOUS | Status: DC | PRN
  Administered 2020-10-20: 1.5 mg/kg/h via INTRAVENOUS

## 2020-10-20 MED ORDER — FENTANYL CITRATE (PF) 250 MCG/5ML IJ SOLN
INTRAMUSCULAR | Status: AC
  Filled 2020-10-20: qty 20

## 2020-10-20 MED ORDER — NICARDIPINE HCL IN NACL 20-0.86 MG/200ML-% IV SOLN
0.0000 mg/h | INTRAVENOUS | Status: DC
Start: 2020-10-20 — End: 2020-10-22
  Administered 2020-10-20: 5 mg/h via INTRAVENOUS
  Filled 2020-10-20 (×2): qty 200

## 2020-10-20 MED ORDER — HEPARIN SODIUM (PORCINE) 1000 UNIT/ML IJ SOLN
INTRAMUSCULAR | Status: AC
  Filled 2020-10-20: qty 1

## 2020-10-20 MED ORDER — 0.9 % SODIUM CHLORIDE (POUR BTL) OPTIME
TOPICAL | Status: DC | PRN
  Administered 2020-10-20: 5000 mL

## 2020-10-20 MED ORDER — ACETAMINOPHEN 500 MG PO TABS
1000.0000 mg | ORAL_TABLET | Freq: Four times a day (QID) | ORAL | Status: DC
  Administered 2020-10-21 – 2020-10-23 (×10): 1000 mg via ORAL
  Filled 2020-10-20 (×12): qty 2

## 2020-10-20 MED ORDER — PHENYLEPHRINE HCL-NACL 10-0.9 MG/250ML-% IV SOLN
INTRAVENOUS | Status: DC | PRN
  Administered 2020-10-20: 30 ug/min via INTRAVENOUS

## 2020-10-20 MED ORDER — MAGNESIUM SULFATE 4 GM/100ML IV SOLN
4.0000 g | Freq: Once | INTRAVENOUS | Status: AC
  Administered 2020-10-20: 4 g via INTRAVENOUS
  Filled 2020-10-20: qty 100

## 2020-10-20 MED ORDER — GLYCOPYRROLATE PF 0.2 MG/ML IJ SOSY
PREFILLED_SYRINGE | INTRAMUSCULAR | Status: AC
  Filled 2020-10-20: qty 1

## 2020-10-20 MED ORDER — DOCUSATE SODIUM 100 MG PO CAPS
200.0000 mg | ORAL_CAPSULE | Freq: Every day | ORAL | Status: DC
  Administered 2020-10-21 – 2020-10-22 (×2): 200 mg via ORAL
  Filled 2020-10-20 (×2): qty 2

## 2020-10-20 MED ORDER — ROCURONIUM BROMIDE 10 MG/ML (PF) SYRINGE
PREFILLED_SYRINGE | INTRAVENOUS | Status: AC
  Filled 2020-10-20: qty 10

## 2020-10-20 MED ORDER — PROTAMINE SULFATE 10 MG/ML IV SOLN
INTRAVENOUS | Status: AC
  Filled 2020-10-20: qty 5

## 2020-10-20 SURGICAL SUPPLY — 94 items
BAG DECANTER FOR FLEXI CONT (MISCELLANEOUS) ×3 IMPLANT
BLADE CLIPPER SURG (BLADE) ×3 IMPLANT
BLADE STERNUM SYSTEM 6 (BLADE) ×3 IMPLANT
BLADE SURG 11 STRL SS (BLADE) ×3 IMPLANT
BNDG ELASTIC 4X5.8 VLCR STR LF (GAUZE/BANDAGES/DRESSINGS) ×3 IMPLANT
BNDG ELASTIC 6X5.8 VLCR STR LF (GAUZE/BANDAGES/DRESSINGS) ×3 IMPLANT
BNDG GAUZE ELAST 4 BULKY (GAUZE/BANDAGES/DRESSINGS) ×3 IMPLANT
CABLE SURGICAL S-101-97-12 (CABLE) ×3 IMPLANT
CANISTER SUCT 3000ML PPV (MISCELLANEOUS) ×3 IMPLANT
CANNULA MC2 2 STG 29/37 NON-V (CANNULA) ×2 IMPLANT
CANNULA MC2 TWO STAGE (CANNULA) ×1
CANNULA NON VENT 20FR 12 (CANNULA) ×3 IMPLANT
CATH ROBINSON RED A/P 18FR (CATHETERS) ×6 IMPLANT
CLIP RETRACTION 3.0MM CORONARY (MISCELLANEOUS) ×3 IMPLANT
CLIP VESOCCLUDE MED 24/CT (CLIP) IMPLANT
CLIP VESOCCLUDE SM WIDE 24/CT (CLIP) IMPLANT
CONN ST 1/2X1/2  BEN (MISCELLANEOUS) ×1
CONN ST 1/2X1/2 BEN (MISCELLANEOUS) ×2 IMPLANT
CONNECTOR BLAKE 2:1 CARIO BLK (MISCELLANEOUS) ×3 IMPLANT
CONTAINER PROTECT SURGISLUSH (MISCELLANEOUS) ×6 IMPLANT
DERMABOND ADVANCED (GAUZE/BANDAGES/DRESSINGS) ×1
DERMABOND ADVANCED .7 DNX12 (GAUZE/BANDAGES/DRESSINGS) ×2 IMPLANT
DRAIN CHANNEL 19F RND (DRAIN) ×9 IMPLANT
DRAIN CONNECTOR BLAKE 1:1 (MISCELLANEOUS) ×3 IMPLANT
DRAPE CARDIOVASCULAR INCISE (DRAPES) ×1
DRAPE INCISE IOBAN 66X45 STRL (DRAPES) IMPLANT
DRAPE SRG 135X102X78XABS (DRAPES) ×2 IMPLANT
DRAPE WARM FLUID 44X44 (DRAPES) ×3 IMPLANT
DRSG AQUACEL AG ADV 3.5X10 (GAUZE/BANDAGES/DRESSINGS) ×3 IMPLANT
DRSG AQUACEL AG ADV 3.5X14 (GAUZE/BANDAGES/DRESSINGS) ×3 IMPLANT
DRSG COVADERM 4X14 (GAUZE/BANDAGES/DRESSINGS) IMPLANT
ELECT BLADE 4.0 EZ CLEAN MEGAD (MISCELLANEOUS) ×3
ELECT REM PT RETURN 9FT ADLT (ELECTROSURGICAL) ×6
ELECTRODE BLDE 4.0 EZ CLN MEGD (MISCELLANEOUS) ×2 IMPLANT
ELECTRODE REM PT RTRN 9FT ADLT (ELECTROSURGICAL) ×4 IMPLANT
FELT TEFLON 1X6 (MISCELLANEOUS) ×3 IMPLANT
GAUZE SPONGE 2X2 8PLY NS (GAUZE/BANDAGES/DRESSINGS) ×3 IMPLANT
GAUZE SPONGE 4X4 12PLY STRL (GAUZE/BANDAGES/DRESSINGS) ×6 IMPLANT
GAUZE SPONGE 4X4 12PLY STRL LF (GAUZE/BANDAGES/DRESSINGS) ×3 IMPLANT
GLOVE BIO SURGEON STRL SZ7 (GLOVE) ×6 IMPLANT
GLOVE BIOGEL M STRL SZ7.5 (GLOVE) ×6 IMPLANT
GLOVE SURG MICRO LTX SZ6 (GLOVE) ×3 IMPLANT
GLOVE SURG MICRO LTX SZ6.5 (GLOVE) ×6 IMPLANT
GLOVE SURG POLYISO LF SZ6 (GLOVE) ×3 IMPLANT
GLOVE SURG UNDER POLY LF SZ6.5 (GLOVE) ×12 IMPLANT
GOWN STRL REUS W/ TWL LRG LVL3 (GOWN DISPOSABLE) ×20 IMPLANT
GOWN STRL REUS W/ TWL XL LVL3 (GOWN DISPOSABLE) ×4 IMPLANT
GOWN STRL REUS W/TWL LRG LVL3 (GOWN DISPOSABLE) ×10
GOWN STRL REUS W/TWL XL LVL3 (GOWN DISPOSABLE) ×2
HEMOSTAT POWDER SURGIFOAM 1G (HEMOSTASIS) ×9 IMPLANT
INSERT SUTURE HOLDER (MISCELLANEOUS) ×3 IMPLANT
KIT BASIN OR (CUSTOM PROCEDURE TRAY) ×3 IMPLANT
KIT DRAINAGE VACCUM ASSIST (KITS) ×3 IMPLANT
KIT SUCTION CATH 14FR (SUCTIONS) ×3 IMPLANT
KIT TURNOVER KIT B (KITS) ×3 IMPLANT
KIT VASOVIEW HEMOPRO 2 VH 4000 (KITS) ×3 IMPLANT
LEAD PACING MYOCARDI (MISCELLANEOUS) IMPLANT
MARKER GRAFT CORONARY BYPASS (MISCELLANEOUS) ×15 IMPLANT
NS IRRIG 1000ML POUR BTL (IV SOLUTION) ×15 IMPLANT
PACK ACCESSORY CANNULA KIT (KITS) ×3 IMPLANT
PACK E OPEN HEART (SUTURE) ×3 IMPLANT
PACK OPEN HEART (CUSTOM PROCEDURE TRAY) ×3 IMPLANT
PAD ARMBOARD 7.5X6 YLW CONV (MISCELLANEOUS) ×6 IMPLANT
PAD ELECT DEFIB RADIOL ZOLL (MISCELLANEOUS) ×3 IMPLANT
PENCIL BUTTON HOLSTER BLD 10FT (ELECTRODE) ×3 IMPLANT
POSITIONER HEAD DONUT 9IN (MISCELLANEOUS) ×3 IMPLANT
PUMP SARN DELFIN (MISCELLANEOUS) ×3 IMPLANT
PUNCH AORTIC ROTATE 4.0MM (MISCELLANEOUS) ×3 IMPLANT
SET CARDIOPLEGIA MPS 5001102 (MISCELLANEOUS) ×3 IMPLANT
SUPPORT HEART JANKE-BARRON (MISCELLANEOUS) ×3 IMPLANT
SUT BONE WAX W31G (SUTURE) ×3 IMPLANT
SUT ETHIBOND X763 2 0 SH 1 (SUTURE) ×6 IMPLANT
SUT MNCRL AB 3-0 PS2 18 (SUTURE) ×9 IMPLANT
SUT MNCRL AB 4-0 PS2 18 (SUTURE) ×6 IMPLANT
SUT PDS AB 1 CTX 36 (SUTURE) ×6 IMPLANT
SUT PROLENE 4 0 SH DA (SUTURE) ×3 IMPLANT
SUT PROLENE 5 0 C 1 36 (SUTURE) ×12 IMPLANT
SUT PROLENE 7 0 BV 1 (SUTURE) ×3 IMPLANT
SUT PROLENE 7 0 BV1 MDA (SUTURE) ×6 IMPLANT
SUT SILK  1 MH (SUTURE) ×2
SUT SILK 1 MH (SUTURE) ×4 IMPLANT
SUT STEEL 6MS V (SUTURE) IMPLANT
SUT STEEL STERNAL CCS#1 18IN (SUTURE) ×9 IMPLANT
SUT VIC AB 1 CTX 18 (SUTURE) ×3 IMPLANT
SUT VIC AB 2-0 CT1 27 (SUTURE) ×2
SUT VIC AB 2-0 CT1 TAPERPNT 27 (SUTURE) ×4 IMPLANT
SYSTEM SAHARA CHEST DRAIN ATS (WOUND CARE) ×3 IMPLANT
TAPE CLOTH SURG 4X10 WHT LF (GAUZE/BANDAGES/DRESSINGS) ×6 IMPLANT
TOWEL GREEN STERILE (TOWEL DISPOSABLE) ×3 IMPLANT
TOWEL GREEN STERILE FF (TOWEL DISPOSABLE) ×3 IMPLANT
TRAY FOLEY SLVR 16FR TEMP STAT (SET/KITS/TRAYS/PACK) ×3 IMPLANT
TUBING LAP HI FLOW INSUFFLATIO (TUBING) ×3 IMPLANT
UNDERPAD 30X36 HEAVY ABSORB (UNDERPADS AND DIAPERS) ×3 IMPLANT
WATER STERILE IRR 1000ML POUR (IV SOLUTION) ×6 IMPLANT

## 2020-10-20 NOTE — Progress Notes (Signed)
     [address]       [place] [address]             [phone]       No events  Vitals:   10/19/20 2107 10/20/20 0557  BP: (!) 160/86 (!) 161/81  Pulse: 61 71  Resp: 17   Temp: 97.8 F (36.6 C)   SpO2: 100%    Sinus EWOB  65 yo male with 3V CAD OR today for [place]

## 2020-10-20 NOTE — Hospital Course (Addendum)
HPI: This is a 65 year old male transferred from [facility] following an NSTEMI.  He underwent a left heart cath which showed severe three-vessel disease.  He does have a history of coronary artery disease and has undergone PCI with stent placement on 2 occasions the last of which was in 2018.  Patient presented to the emergency department due to pain that he was experiencing in his chest.  He has had several episodes over the past month.  His RV and LV function about the preserved, and he has no significant valvular disease.  [name] discussed the need for coronary artery bypass grafting surgery. Potential risks, benefits, and complications of the surgery were discussed with the patient and he agreed to proceed with surgery. Pre operative carotid duplex US showed no significant internal carotid artery stenosis bilaterally. Patient underwent a CABG x 4 on 10/20/2020.  Hospital Course: Patient was extubated early afternoon without difficulty. He remained afebrile and hemodynamically stable. He was weaned off the Neo Syneprhine drip. [name], a line, and foley were removed early in his post operative course. Chest tubes remained for a few days and then were removed. Patient was started on Lopressor. He was volume overloaded and diuresed accordingly. He was weaned off the Insulin drip. He was restarted on Metformin closer to discharge. His pre op HGA1C 12.4. He will need close medical follow up after discharge. He had expected post op blood loss anemia. He did not require a post op transfusion. His last H and H was ***. He was felt surgically stable for transfer from the ICU to 4E for further convalescence on 05/04. He maintained sinus rhythm. He was weaned to room air and had good oxygenation. All of his wounds are clean, dry, and healing without signs of infection. He has been tolerating a diet and has had a bowel movement. Chest tube sutures were removed the day of discharge. He is felt  surgically stable for discharge today.

## 2020-10-20 NOTE — Anesthesia Postprocedure Evaluation (Signed)
Anesthesia Post Note  Patient: Dwayne Garrett  Procedure(s) Performed: CORONARY ARTERY BYPASS GRAFTING (CABG) TIMES FOUR ON PUMP, USING LEFT INTERNAL MAMMARY ARTERY AND RIGHT ENDOSCOPIC GREATER SAPHENOUS VEIN HARVEST CONDUITS (N/A Chest) TRANSESOPHAGEAL ECHOCARDIOGRAM (TEE) (N/A Esophagus)     Patient location during evaluation: SICU Anesthesia Type: General Level of consciousness: sedated Pain management: pain level controlled Vital Signs Assessment: post-procedure vital signs reviewed and stable Respiratory status: patient remains intubated per anesthesia plan Cardiovascular status: stable Postop Assessment: no apparent nausea or vomiting Anesthetic complications: no   No complications documented.  Last Vitals:  Vitals:   10/20/20 1325 10/20/20 1400  BP:  129/90  Pulse: 64 (!) 38  Resp: 12 14  Temp:  (!) 35.9 C  SpO2: 97% 99%    Last Pain:  Vitals:   10/20/20 1400  TempSrc: Bladder  PainSc:                  Karyl Kinnier Audray Rumore

## 2020-10-20 NOTE — Procedures (Signed)
Extubation Procedure Note  Patient Details:   Name: Dwayne Garrett DOB: 1955/12/28 MRN: [id number]   Airway Documentation:    Vent end date: 10/20/20 Vent end time: 1630   Evaluation  O2 sats: stable throughout Complications: No apparent complications Patient did tolerate procedure well. Bilateral Breath Sounds: Clear,Diminished   Yes   RT extubated patient to 2L [place] per MD order/rapid wean protocol, with RN at bedside. Positive cuff leak noted. NIF -28 VC 1210 with good effort. Patient tolerated well. No stridor noted at this time. RT will continue to monitor as needed.   Fabiola Backer 10/20/2020, 4:38 PM

## 2020-10-20 NOTE — Anesthesia Procedure Notes (Signed)
Arterial Line Insertion Start/End5/07/2020 7:20 AM, 10/20/2020 7:20 AM Performed by: CRNA  Preanesthetic checklist: patient identified, IV checked, site marked, risks and benefits discussed, surgical consent, monitors and equipment checked, pre-op evaluation, timeout performed and anesthesia consent Lidocaine 1% used for infiltration Left, radial was placed Catheter size: 20 G Hand hygiene performed  and maximum sterile barriers used   Attempts: 1 Procedure performed without using ultrasound guided technique. Following insertion, dressing applied and Biopatch. Post procedure assessment: normal  Patient tolerated the procedure well with no immediate complications.

## 2020-10-20 NOTE — Anesthesia Procedure Notes (Signed)
Central Venous Catheter Insertion Performed by: Murvin Natal, MD, anesthesiologist Start/End5/07/2020 7:00 AM, 10/20/2020 7:15 AM Patient location: Pre-op. Preanesthetic checklist: patient identified, IV checked, site marked, risks and benefits discussed, surgical consent, monitors and equipment checked, pre-op evaluation, timeout performed and anesthesia consent Position: Trendelenburg Lidocaine 1% used for infiltration and patient sedated Hand hygiene performed , maximum sterile barriers used  and Seldinger technique used Catheter size: 8.5 Fr Total catheter length 10. Central line was placed.Sheath introducer Procedure performed using ultrasound guided technique. Ultrasound Notes:anatomy identified, needle tip was noted to be adjacent to the nerve/plexus identified, no ultrasound evidence of intravascular and/or intraneural injection and image(s) printed for medical record Attempts: 1 Following insertion, line sutured, dressing applied and Biopatch. Post procedure assessment: blood return through all ports, free fluid flow and no air  Patient tolerated the procedure well with no immediate complications.

## 2020-10-20 NOTE — Transfer of Care (Signed)
Immediate Anesthesia Transfer of Care Note  Patient: Dwayne Garrett  Procedure(s) Performed: CORONARY ARTERY BYPASS GRAFTING (CABG) TIMES FOUR ON PUMP, USING LEFT INTERNAL MAMMARY ARTERY AND RIGHT ENDOSCOPIC GREATER SAPHENOUS VEIN HARVEST CONDUITS (N/A Chest) TRANSESOPHAGEAL ECHOCARDIOGRAM (TEE) (N/A Esophagus)  Patient Location: ICU  Anesthesia Type:General  Level of Consciousness: sedated and unresponsive  Airway & Oxygen Therapy: Patient remains intubated per anesthesia plan and Patient placed on Ventilator (see vital sign flow sheet for setting)  Post-op Assessment: Report given to RN and Post -op Vital signs reviewed and stable  Post vital signs: Reviewed and stable  Last Vitals:  Vitals Value Taken Time  BP 123/84 10/20/20 1345  Temp    Pulse 64 10/20/20 1346  Resp 12 10/20/20 1346  SpO2 98 % 10/20/20 1346  Vitals shown include unvalidated device data.  Last Pain:  Vitals:   10/19/20 2107  TempSrc: Oral  PainSc: 0-No pain      Patients Stated Pain Goal: 0 (68/11/57 2620)  Complications: No complications documented.

## 2020-10-20 NOTE — Anesthesia Procedure Notes (Signed)
Procedure Name: Intubation Date/Time: 10/20/2020 8:02 AM Performed by: Mariea Clonts, CRNA Pre-anesthesia Checklist: Patient identified, Emergency Drugs available, Suction available and Patient being monitored Patient Re-evaluated:Patient Re-evaluated prior to induction Oxygen Delivery Method: Circle System Utilized Preoxygenation: Pre-oxygenation with 100% oxygen Induction Type: IV induction Ventilation: Oral airway inserted - appropriate to patient size, Two handed mask ventilation required, Mask ventilation with difficulty and Mask ventilation throughout procedure Laryngoscope Size: Glidescope and 4 Grade View: Grade II Tube type: Oral Tube size: 8.0 mm Number of attempts: 2 Airway Equipment and Method: Stylet,  Oral airway and Video-laryngoscopy Placement Confirmation: ETT inserted through vocal cords under direct vision,  positive ETCO2 and breath sounds checked- equal and bilateral Tube secured with: Tape Dental Injury: Teeth and Oropharynx as per pre-operative assessment  Difficulty Due To: Difficulty was unanticipated, Difficult Airway- due to reduced neck mobility and Difficult Airway- due to dentition Future Recommendations: Recommend- induction with short-acting agent, and alternative techniques readily available

## 2020-10-20 NOTE — Discharge Summary (Addendum)
Physician Discharge Summary       [address]       [place] [address]             [phone]    Patient ID: Dwayne Garrett MRN: [id number] DOB/AGE: 1956/03/04 65 y.o.  Admit date: 10/14/2020 Discharge date: 10/24/2020  Admission Diagnoses: 1. Unstable angina ([facility]) 2. NSTEMI (non-ST elevated myocardial infarction) ([facility]) 3. Coronary artery disease  Discharge Diagnoses:  1. S/p CABG x 4 2. History of the following: Abnormal LFTs     Cirrhosis per liver biopsy - follows with Dr. Laural Golden  . Bilateral leg edema    Chronic  . 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 Coronary atherosclerosis of native coronary artery    Multivessel (moderate), DES RCA 4/11, LVEF 60-65%. LHC 03/11/17: DES to dist RCA & 3rd left ventricular branch  . Essential hypertension, benign   . Inferior myocardial infarction ([facility])    09/2009  . OSA (obstructive sleep apnea)    on CPAP  History of mixed hyperlipidemia  History of Diabetes mellitus type 2 with complications (H[facility])  Consults: cardiology  Procedure (s):  CABG X 4, LIMA to LAD, reverse saphenous vein graft to first diagonal, OM1, PDA. Endoscopic greater saphenous vein harvest on the right by [name] on 10/20/2020.    HPI: This is a 65 year old male transferred from [facility] following an NSTEMI.  He underwent a left heart cath which showed severe three-vessel disease.  He does have a history of coronary artery disease and has undergone PCI with stent placement on 2 occasions the last of which was in 2018.  Patient presented to the emergency department due to pain that he was experiencing in his chest.  He has had several episodes over the past month.  His RV and LV function about the preserved, and he has no significant valvular disease.  [name] discussed the need for coronary artery bypass grafting surgery. Potential risks, benefits, and complications of the surgery were discussed with the patient and he  agreed to proceed with surgery. Pre operative carotid duplex US showed no significant internal carotid artery stenosis bilaterally. Patient underwent a CABG x 4 on 10/20/2020.  Hospital Course: Patient was extubated early afternoon without difficulty. He remained afebrile and hemodynamically stable. He was weaned off the Neo Syneprhine drip. [name], a line, and foley were removed early in his post operative course. Chest tubes remained for a few days and then were removed. Patient was started on Lopressor. He was volume overloaded and diuresed accordingly. He was weaned off the Insulin drip. He was restarted on Metformin closer to discharge. His pre op HGA1C 12.4. He will need close medical follow up after discharge. He had expected post op blood loss anemia. He did not require a post op transfusion. His last H and H was . He was felt surgically stable for transfer from the ICU to 4E for further convalescence on 05/04. He maintained sinus rhythm. He was weaned to room air and had good oxygenation. All of his wounds are clean, dry, and healing without signs of infection. He has been tolerating a diet and has had a bowel movement. Chest tube sutures were removed the day of discharge. He is felt surgically stable for discharge today.     Latest Vital Signs: Blood pressure (!) 149/93, pulse 74, temperature 99 F (37.2 C), temperature source Oral, resp. rate 20, height 5' 9"  (1.753 m), weight 99 kg, SpO2 95 %.  Physical  Exam:  General appearance: alert, cooperative and no distress Heart: regular rate and rhythm, S1, S2 normal, no murmur, click, rub or gallop Lungs: clear to auscultation bilaterally Abdomen: soft, non-tender; bowel sounds normal; no masses,  no organomegaly Extremities: 2+ pitting edema with tenderness Wound: clean and dry  Discharge Condition: Stable and discharged to home.  Recent laboratory studies:  Lab Results  Component Value Date   WBC 13.6 (H) 10/23/2020   HGB 11.2  (L) 10/23/2020   HCT 34.2 (L) 10/23/2020   MCV 93.2 10/23/2020   PLT 191 10/23/2020   Lab Results  Component Value Date   NA 137 10/23/2020   K 3.8 10/23/2020   CL 103 10/23/2020   CO2 27 10/23/2020   CREATININE 0.69 10/23/2020   GLUCOSE 170 (H) 10/23/2020      Diagnostic Studies: DG Chest 2 View  Result Date: 10/23/2020 CLINICAL DATA:  Shortness of breath.  Chest pain. EXAM: CHEST - 2 VIEW COMPARISON:  10/22/2020. FINDINGS: Interim removal of mediastinal drainage catheter and left chest tube. No pneumothorax. Prior CABG. Stable cardiomegaly. Left base infiltrate with small left pleural effusion again noted. IMPRESSION: 1. Interim removal of mediastinal drainage catheter left chest tube. No pneumothorax. 2.  Prior CABG.  Stable cardiomegaly. 3. Left base infiltrate and small left pleural effusion again noted. Electronically Signed   By: Marcello Moores  Register   On: 10/23/2020 07:56   DG Chest 2 View  Result Date: 10/15/2020 CLINICAL DATA:  Chest pain and heaviness. Previous history of heart attack and stent placement EXAM: CHEST - 2 VIEW COMPARISON:  03/10/2017 FINDINGS: The heart size and mediastinal contours are within normal limits. Both lungs are clear. The visualized skeletal structures are unremarkable. Mild hyperinflation. Degenerative changes in the spine. IMPRESSION: No active cardiopulmonary disease. Electronically Signed   By: Lucienne Capers M.D.   On: 10/15/2020 00:49   CARDIAC CATHETERIZATION  Result Date: 10/15/2020  Prox Cx lesion is 30% stenosed.  Previously placed Mid RCA to Dist RCA drug eluting stent is widely patent.  Previously placed 3rd RPL drug eluting stent is widely patent.  Prox RCA lesion is 95% stenosed.  RPAV lesion is 99% stenosed.  RPDA lesion is 70% stenosed.  1st RPL lesion is 80% stenosed.  RV Branch lesion is 80% stenosed.  Mid LM lesion is 60% stenosed.  2nd Mrg lesion is 80% stenosed.  Prox LAD to Mid LAD lesion is 60% stenosed.  Previously  placed 1st Diag-1 stent (unknown type) is widely patent.  1st Diag-2 lesion is 80% stenosed.  1.  Significant left main and three-vessel coronary artery disease. 2.  Left ventricular angiography was not performed.  Ejection fraction appeared normal by echo.  Mildly elevated left ventricular end-diastolic pressure. Recommendations: Recommend evaluation for CABG.  Resume heparin 8 hours after sheath pull.   DG Chest Port 1 View  Result Date: 10/22/2020 CLINICAL DATA:  Shortness of breath.  Pleural effusion. EXAM: PORTABLE CHEST 1 VIEW COMPARISON:  10/21/2020. FINDINGS: Interim removal of right IJ line. Mediastinal drainage catheter and left chest tube in stable position. Prior CABG. Cardiomegaly. Mild bilateral interstitial prominence again noted. Persistent left base atelectasis/infiltrate. Tiny bilateral pleural effusions again noted. No pneumothorax. IMPRESSION: 1. Interim removal of right IJ line. Mediastinal drainage catheter left chest tube in stable position. No pneumothorax. 2. Prior CABG. Cardiomegaly. Mild bilateral interstitial prominence again noted. Persistent left base atelectasis/infiltrate. Tiny bilateral pleural effusions again noted. Electronically Signed   By: Marcello Moores  Register   On: 10/22/2020 06:30  DG Chest Port 1 View  Result Date: 10/21/2020 CLINICAL DATA:  Chest tube.  Open-heart surgery. EXAM: PORTABLE CHEST 1 VIEW COMPARISON:  10/20/2020. FINDINGS: Interim removal of endotracheal tube and NG tube. Right IJ line, mediastinal drainage catheter, left chest tube in stable position. Prior CABG. Cardiomegaly with mild bilateral interstitial prominence. Tiny bilateral pleural effusions cannot be excluded. Findings suggest mild CHF. IMPRESSION: 1. Interim removal of endotracheal tube and NG tube. Right IJ line mediastinal drainage catheter, left chest tube in stable position. 2. Prior CABG. Cardiomegaly with mild bilateral interstitial prominence. Tiny bilateral pleural effusions can not be  excluded. Findings suggest mild CHF. Electronically Signed   By: Marcello Moores  Register   On: 10/21/2020 06:50   DG Chest Port 1 View  Result Date: 10/20/2020 CLINICAL DATA:  Pneumothorax status post CABG. EXAM: PORTABLE CHEST 1 VIEW COMPARISON:  10/15/2020. FINDINGS: Right IJ catheter tip is in the projection of the SVC. There is a nasogastric tube with tip and side port below the level of the GE junction. Endotracheal tube tip is above the carina. Normal heart size. No pleural effusion or interstitial edema. Subsegmental atelectasis noted within the left lower lobe. No significant pneumothorax identified. IMPRESSION: 1. Support apparatus positioned as above. 2. No pneumothorax status post CABG procedure. 3. Left lower lobe atelectasis. Electronically Signed   By: Kerby Moors M.D.   On: 10/20/2020 14:24   ECHOCARDIOGRAM COMPLETE  Result Date: 10/15/2020    ECHOCARDIOGRAM REPORT   Patient Name:   KEONTE DAUBENSPECK Date of Exam: 10/15/2020 Medical Rec #:  [id number]        Height:       69.0 in Accession #:    [id number]       Weight:       210.0 lb Date of Birth:  April 08, 1956        BSA:          2.109 m Patient Age:    73 years         BP:           137/84 mmHg Patient Gender: M                HR:           62 bpm. Exam Location:  Inpatient Procedure: 2D Echo, Cardiac Doppler and Color Doppler Indications:    Chest pain  History:        Patient has no prior history of Echocardiogram examinations.                 Previous Myocardial Infarction; Risk Factors:Hypertension.  Sonographer:    [place] Referring Phys: [id number] Darreld Mclean  Sonographer Comments: 12/11/14, 03/11/17, and 10/15/20 cath IMPRESSIONS  1. Left ventricular ejection fraction, by estimation, is 60 to 65%. The left ventricle has normal function. The left ventricle has no regional wall motion abnormalities. There is moderate left ventricular hypertrophy. Left ventricular diastolic parameters are indeterminate.  2. Right ventricular systolic  function is normal. The right ventricular size is normal. There is mildly elevated pulmonary artery systolic pressure. The estimated right ventricular systolic pressure is 67.5 mmHg.  3. The mitral valve is normal in structure. Trivial mitral valve regurgitation.  4. The aortic valve is tricuspid. Aortic valve regurgitation is not visualized. No aortic stenosis is present.  5. The inferior vena cava is normal in size with greater than 50% respiratory variability, suggesting right atrial pressure of 3 mmHg. FINDINGS  Left Ventricle: Left ventricular ejection  fraction, by estimation, is 60 to 65%. The left ventricle has normal function. The left ventricle has no regional wall motion abnormalities. The left ventricular internal cavity size was normal in size. There is  moderate left ventricular hypertrophy. Left ventricular diastolic parameters are indeterminate. Right Ventricle: The right ventricular size is normal. No increase in right ventricular wall thickness. Right ventricular systolic function is normal. There is mildly elevated pulmonary artery systolic pressure. The tricuspid regurgitant velocity is 2.87  m/s, and with an assumed right atrial pressure of 3 mmHg, the estimated right ventricular systolic pressure is 85.0 mmHg. Left Atrium: Left atrial size was normal in size. Right Atrium: Right atrial size was normal in size. Pericardium: There is no evidence of pericardial effusion. Mitral Valve: The mitral valve is normal in structure. Trivial mitral valve regurgitation. Tricuspid Valve: The tricuspid valve is normal in structure. Tricuspid valve regurgitation is trivial. Aortic Valve: The aortic valve is tricuspid. Aortic valve regurgitation is not visualized. No aortic stenosis is present. Aortic valve mean gradient measures 4.0 mmHg. Aortic valve peak gradient measures 7.1 mmHg. Aortic valve area, by VTI measures 2.92 cm. Pulmonic Valve: The pulmonic valve was grossly normal. Pulmonic valve regurgitation  is not visualized. Aorta: The aortic root and ascending aorta are structurally normal, with no evidence of dilitation. Venous: The inferior vena cava is normal in size with greater than 50% respiratory variability, suggesting right atrial pressure of 3 mmHg. IAS/Shunts: The interatrial septum was not well visualized.  LEFT VENTRICLE PLAX 2D LVIDd:         4.60 cm     Diastology LVIDs:         2.60 cm     LV e' medial:    5.77 cm/s LV PW:         1.50 cm     LV E/e' medial:  14.4 LV IVS:        1.30 cm     LV e' lateral:   8.70 cm/s LVOT diam:     1.90 cm     LV E/e' lateral: 9.6 LV SV:         74 LV SV Index:   35 LVOT Area:     2.84 cm  LV Volumes (MOD) LV vol d, MOD A2C: 55.7 ml LV vol d, MOD A4C: 87.4 ml LV vol s, MOD A2C: 31.0 ml LV vol s, MOD A4C: 25.9 ml LV SV MOD A2C:     24.7 ml LV SV MOD A4C:     87.4 ml LV SV MOD BP:      44.4 ml RIGHT VENTRICLE RV S prime:     13.10 cm/s TAPSE (M-mode): 1.6 cm LEFT ATRIUM             Index       RIGHT ATRIUM           Index LA diam:        4.00 cm 1.90 cm/m  RA Area:     14.30 cm LA Vol (A2C):   42.1 ml 19.96 ml/m RA Volume:   33.30 ml  15.79 ml/m LA Vol (A4C):   44.6 ml 21.14 ml/m LA Biplane Vol: 46.1 ml 21.86 ml/m  AORTIC VALVE                    PULMONIC VALVE AV Area (Vmax):    3.04 cm     PV Vmax:       0.87 m/s AV Area (Vmean):  3.21 cm     PV Vmean:      58.400 cm/s AV Area (VTI):     2.92 cm     PV VTI:        0.205 m AV Vmax:           133.50 cm/s  PV Peak grad:  3.0 mmHg AV Vmean:          92.000 cm/s  PV Mean grad:  2.0 mmHg AV VTI:            0.254 m AV Peak Grad:      7.1 mmHg AV Mean Grad:      4.0 mmHg LVOT Vmax:         143.00 cm/s LVOT Vmean:        104.000 cm/s LVOT VTI:          0.262 m LVOT/AV VTI ratio: 1.03  AORTA Ao Root diam: 3.20 cm Ao Asc diam:  3.50 cm MITRAL VALVE               TRICUSPID VALVE MV Area (PHT): 3.60 cm    TR Peak grad:   32.9 mmHg MV Decel Time: 211 msec    TR Vmax:        287.00 cm/s MR Peak grad: 86.9 mmHg MR Mean  grad: 57.0 mmHg    SHUNTS MR Vmax:      466.00 cm/s  Systemic VTI:  0.26 m MR Vmean:     359.0 cm/s   Systemic Diam: 1.90 cm MV E velocity: 83.10 cm/s MV A velocity: 63.40 cm/s MV E/A ratio:  1.31 Oswaldo Milian MD Electronically signed by Oswaldo Milian MD Signature Date/Time: 10/15/2020/6:27:03 PM    Final    ECHO INTRAOPERATIVE TEE  Result Date: 10/20/2020  *INTRAOPERATIVE TRANSESOPHAGEAL REPORT *  Patient Name:   TEEGAN BRANDIS Date of Exam: 10/20/2020 Medical Rec #:  [id number]        Height: Accession #:    [id number]       Weight: Date of Birth:  21-Jul-1955        BSA: Patient Age:    49 years         BP:           161/81 mmHg Patient Gender: M                HR:           53 bpm. Exam Location:  Anesthesiology Transesophogeal exam was perform intraoperatively during surgical procedure. Patient was closely monitored under general anesthesia during the entirety of examination. Indications:     I25.5 Ischemic cardiomyopathy Performing Phys: Adele Barthel MD Diagnosing Phys: Adele Barthel MD Complications: No known complications during this procedure. POST-OP IMPRESSIONS - Left Ventricle: The left ventricle is unchanged from pre-bypass. - Right Ventricle: The right ventricle appears unchanged from pre-bypass. - Aorta: The aorta appears unchanged from pre-bypass. - Left Atrium: The left atrium appears unchanged from pre-bypass. - Left Atrial Appendage: The left atrial appendage appears unchanged from pre-bypass. - Aortic Valve: The aortic valve appears unchanged from pre-bypass. - Mitral Valve: There is mild regurgitation. - Tricuspid Valve: The tricuspid valve appears unchanged from pre-bypass. - Pulmonic Valve: The pulmonic valve appears unchanged from pre-bypass. - Interatrial Septum: The interatrial septum appears unchanged from pre-bypass. - Interventricular Septum: The interventricular septum appears unchanged from pre-bypass. - Pericardium: The pericardium appears unchanged from pre-bypass.  PRE-OP FINDINGS  Left Ventricle: The left ventricle has normal systolic function,  with an ejection fraction of 60-65%. The cavity size was normal. There is no increase in left ventricular wall thickness. There is no left ventricular hypertrophy. Right Ventricle: The right ventricle has normal systolic function. The cavity was normal. There is no increase in right ventricular wall thickness. Left Atrium: Left atrial size was normal in size. No left atrial/left atrial appendage thrombus was detected. Right Atrium: Right atrial size was normal in size. Interatrial Septum: No atrial level shunt detected by color flow Doppler. Pericardium: There is no evidence of pericardial effusion. Mitral Valve: The mitral valve is normal in structure. Mitral valve regurgitation is trivial by color flow Doppler. Tricuspid Valve: The tricuspid valve was normal in structure. Tricuspid valve regurgitation is trivial by color flow Doppler. Aortic Valve: The aortic valve is normal in structure. Aortic valve regurgitation was not visualized by color flow Doppler. There is no stenosis of the aortic valve, with a calculated valve area of 2.45 cm. Pulmonic Valve: The pulmonic valve was normal in structure. Pulmonic valve regurgitation is trivial by color flow Doppler. Aorta: The aortic root, ascending aorta and aortic arch are normal in size and structure. There is evidence of plaque in the descending aorta. +--------------+--------++ LEFT VENTRICLE         +--------------+--------++ PLAX 2D                +--------------+--------++ LVIDd:        4.10 cm  +--------------+--------++ LVIDs:        2.60 cm  +--------------+--------++ LVOT diam:    2.10 cm  +--------------+--------++ LV SV:        50 ml    +--------------+--------++ LVOT Area:    3.46 cm +--------------+--------++                        +--------------+--------++ +------------------+------------++ AORTIC VALVE                    +------------------+------------++ AV Area (Vmax):   2.72 cm     +------------------+------------++ AV Area (Vmean):  2.53 cm     +------------------+------------++ AV Area (VTI):    2.45 cm     +------------------+------------++ AV Vmax:          153.00 cm/s  +------------------+------------++ AV Vmean:         103.000 cm/s +------------------+------------++ AV VTI:           0.317 m      +------------------+------------++ AV Peak Grad:     9.4 mmHg     +------------------+------------++ AV Mean Grad:     5.0 mmHg     +------------------+------------++ LVOT Vmax:        120.00 cm/s  +------------------+------------++ LVOT Vmean:       75.100 cm/s  +------------------+------------++ LVOT VTI:         0.224 m      +------------------+------------++ LVOT/AV VTI ratio:0.71         +------------------+------------++  +--------------+-------+ SHUNTS                +--------------+-------+ Systemic VTI: 0.22 m  +--------------+-------+ Systemic Diam:2.10 cm +--------------+-------+  Adele Barthel MD Electronically signed by Adele Barthel MD Signature Date/Time: 10/20/2020/1:00:02 PM    Final    VAS US DOPPLER PRE CABG  Result Date: 10/18/2020 PREOPERATIVE VASCULAR EVALUATION Patient Name:  KEES IDROVO  Date of Exam:   10/16/2020 Medical Rec #: [id number]         Accession #:    [id number] Date  of Birth: 1955-07-18         Patient Gender: M Patient Age:   65Y Exam Location:  [facility] Procedure:      VAS US DOPPLER PRE CABG Referring Phys: [id number] HARRELL O LIGHTFOOT --------------------------------------------------------------------------------  Indications:            Pre-CABG. Risk Factors:           Hypertension, current smoker, prior MI, coronary artery                         disease. Vascular Interventions: PCI w/ stent placement. Performing Technologist: Rogelia Rohrer RVT/RDMS Supporting Technologist: Darlin Coco RDMS,RVT  Examination  Guidelines: A complete evaluation includes B-mode imaging, spectral Doppler, color Doppler, and power Doppler as needed of all accessible portions of each vessel. Bilateral testing is considered an integral part of a complete examination. Limited examinations for reoccurring indications may be performed as noted.  Right Carotid Findings: +----------+--------+-------+--------+----------------------+------------------+           PSV cm/sEDV    StenosisDescribe              Comments                             cm/s                                                    +----------+--------+-------+--------+----------------------+------------------+ CCA Prox  90      20                                   intimal thickening +----------+--------+-------+--------+----------------------+------------------+ CCA Distal80      17             heterogenous          intimal thickening +----------+--------+-------+--------+----------------------+------------------+ ICA Prox  102     27             hyperechoic                              +----------+--------+-------+--------+----------------------+------------------+ ICA Distal70      32                                                      +----------+--------+-------+--------+----------------------+------------------+ ECA       154     21             heterogenous and                                                          smooth                                   +----------+--------+-------+--------+----------------------+------------------+ Portions of this table do not  appear on this page. +----------+--------+-------+----------------+------------+           PSV cm/sEDV cmsDescribe        Arm Pressure +----------+--------+-------+----------------+------------+ [id number]            Multiphasic, WNL             +----------+--------+-------+----------------+------------+  +---------+--------+--+--------+--+---------+ VertebralPSV cm/s76EDV cm/s17Antegrade +---------+--------+--+--------+--+---------+ Left Carotid Findings: +----------+--------+--------+--------+------------+------------------+           PSV cm/sEDV cm/sStenosisDescribe    Comments           +----------+--------+--------+--------+------------+------------------+ CCA Prox  116     24                                             +----------+--------+--------+--------+------------+------------------+ CCA Distal79      19              heterogenousintimal thickening +----------+--------+--------+--------+------------+------------------+ ICA Prox  44      15      1-39%   heterogenous                   +----------+--------+--------+--------+------------+------------------+ ICA Distal55      22                                             +----------+--------+--------+--------+------------+------------------+ ECA       100     14                                             +----------+--------+--------+--------+------------+------------------+ +----------+--------+--------+----------------+------------+ SubclavianPSV cm/sEDV cm/sDescribe        Arm Pressure +----------+--------+--------+----------------+------------+           161             Multiphasic, WNL             +----------+--------+--------+----------------+------------+ +---------+--------+--+--------+--+---------+ VertebralPSV cm/s46EDV cm/s19Antegrade +---------+--------+--+--------+--+---------+  ABI Findings: +---------+------------------+-----+---------+--------+ Right    Rt Pressure (mmHg)IndexWaveform Comment  +---------+------------------+-----+---------+--------+ Brachial 118                    triphasic         +---------+------------------+-----+---------+--------+ PTA      121               1.03 biphasic          +---------+------------------+-----+---------+--------+  DP       111               0.94 triphasic         +---------+------------------+-----+---------+--------+ Great Toe101               0.86 Normal            +---------+------------------+-----+---------+--------+ +---------+------------------+-----+---------+------------+ Left     Lt Pressure (mmHg)IndexWaveform Comment      +---------+------------------+-----+---------+------------+ Brachial 112                    triphasic             +---------+------------------+-----+---------+------------+ PTA      134               1.14 triphasic             +---------+------------------+-----+---------+------------+  DP       124               1.05 triphasic             +---------+------------------+-----+---------+------------+ Great Toe74                0.63 Absent   mild disease +---------+------------------+-----+---------+------------+ +-------+---------------+----------------+ ABI/TBIToday's ABI/TBIPrevious ABI/TBI +-------+---------------+----------------+ Right  1.03/0.86                       +-------+---------------+----------------+ Left   1.14/0.63                       +-------+---------------+----------------+  Right Doppler Findings: +--------+--------+-----+---------+--------+ Site    PressureIndexDoppler  Comments +--------+--------+-----+---------+--------+ [id number]          triphasic         +--------+--------+-----+---------+--------+ Radial               triphasic         +--------+--------+-----+---------+--------+ Ulnar                triphasic         +--------+--------+-----+---------+--------+  Left Doppler Findings: +--------+--------+-----+---------+--------+ Site    PressureIndexDoppler  Comments +--------+--------+-----+---------+--------+ [id number]          triphasic         +--------+--------+-----+---------+--------+ Radial               triphasic          +--------+--------+-----+---------+--------+ Ulnar                triphasic         +--------+--------+-----+---------+--------+  Summary: Right Carotid: Velocities in the right ICA are consistent with a 1-39% stenosis.                The ECA appears <50% stenosed. The extracranial vessels were                near-normal with only minimal wall thickening or plaque. Left Carotid: Velocities in the left ICA are consistent with a 1-39% stenosis.               The extracranial vessels were near-normal with only minimal wall               thickening or plaque. Vertebrals:  Bilateral vertebral arteries demonstrate antegrade flow. Subclavians: Normal flow hemodynamics were seen in bilateral subclavian              arteries. Right ABI: Resting right ankle-brachial index is within normal range. No evidence of significant right lower extremity arterial disease. The right toe-brachial index is normal. Left ABI: Resting left ankle-brachial index is within normal range. No evidence of significant left lower extremity arterial disease. Right Upper Extremity: Doppler waveforms decrease >50% with right radial compression. Doppler waveforms remain within normal limits with right ulnar compression. Left Upper Extremity: Doppler waveforms decrease >50% with left radial compression. Doppler waveforms remain within normal limits with left ulnar compression.  Electronically signed by Harold Barban MD on 10/18/2020 at 4:18:10 PM.    Final        Discharge Instructions    Amb Referral to Cardiac Rehabilitation   Complete by: As directed    Diagnosis:  CABG NSTEMI     CABG X ___: 4   After initial evaluation and assessments completed: Virtual Based Care may be provided alone or in conjunction with Phase 2 Cardiac Rehab based on patient  barriers.: Yes      Discharge Medications: Allergies as of 10/24/2020      Reactions   Nyquil Multi-symptom [pseudoeph-doxylamine-dm-apap]    Feel jittery    Tylenol [acetaminophen]     Due to liver      Medication List    STOP taking these medications   amLODipine 5 MG tablet Commonly known as: NORVASC   aspirin 81 MG chewable tablet Replaced by: aspirin 325 MG EC tablet   atorvastatin 40 MG tablet Commonly known as: LIPITOR   carvedilol 25 MG tablet Commonly known as: COREG   cyclobenzaprine 10 MG tablet Commonly known as: FLEXERIL   lidocaine 5 % Commonly known as: Lidoderm   meloxicam 7.5 MG tablet Commonly known as: MOBIC   Nitrostat 0.4 MG SL tablet Generic drug: nitroGLYCERIN   OneTouch Verio test strip Generic drug: glucose blood   ticagrelor 60 MG Tabs tablet Commonly known as: Brilinta     TAKE these medications   acetaminophen 500 MG tablet Commonly known as: TYLENOL Take 2 tablets (1,000 mg total) by mouth every 6 (six) hours.   amiodarone 200 MG tablet Commonly known as: PACERONE Please take 2 tabs (455m) twice a day for 4 days then take 1 tab (2038m twice a day until your follow-up appointment.   aspirin 325 MG EC tablet Take 1 tablet (325 mg total) by mouth daily. Replaces: aspirin 81 MG chewable tablet   docusate sodium 100 MG capsule Commonly known as: COLACE Take by mouth.   Fish Oil 1000 MG Caps Take 3,000 mg by mouth daily.   furosemide 40 MG tablet Commonly known as: LASIX Take 1 tablet (40 mg total) by mouth daily for 5 days.   gabapentin 300 MG capsule Commonly known as: NEURONTIN Take by mouth.   hydrochlorothiazide 25 MG tablet Commonly known as: HYDRODIURIL Take 1 tablet (25 mg total) by mouth daily. Start taking on: Oct 29, 2020 What changed:   how much to take  when to take this  These instructions start on Oct 29, 2020. If you are unsure what to do until then, ask your doctor or other care provider.   lactulose 10 GM/15ML solution Commonly known as: CHRONULAC Take 10 g by mouth 2 (two) times daily.   losartan 50 MG tablet Commonly known as: COZAAR Take 1 tablet (50 mg total) by mouth  daily. What changed:   medication strength  how much to take   metFORMIN 1000 MG tablet Commonly known as: GLUCOPHAGE Take 1,000 mg by mouth 2 (two) times daily with a meal.   metoprolol tartrate 25 MG tablet Commonly known as: LOPRESSOR Take 1 tablet (25 mg total) by mouth 2 (two) times daily.   multivitamin with minerals Tabs tablet Take 1 tablet by mouth daily.   omeprazole 40 MG capsule Commonly known as: PRILOSEC TAKE 1 CAPSULE (40 MG TOTAL) BY MOUTH DAILY.   oxyCODONE 5 MG immediate release tablet Commonly known as: Oxy IR/ROXICODONE Take 1 tablet (5 mg total) by mouth every 6 (six) hours as needed for severe pain. What changed:   when to take this  Another medication with the same name was removed. Continue taking this medication, and follow the directions you see here.   potassium chloride SA 20 MEQ tablet Commonly known as: KLOR-CON Take 2 tablets (40 mEq total) by mouth daily.   rosuvastatin 20 MG tablet Commonly known as: CRESTOR Take 1 tablet (20 mg total) by mouth daily.   sildenafil 100 MG tablet Commonly known  as: VIAGRA Take 100 mg by mouth daily as needed for erectile dysfunction.      The patient has been discharged on:   1.Beta Blocker:  Yes [ x  ]                              No   [   ]                              If No, reason:  2.Ace Inhibitor/ARB: Yes [ x  ]                                     No  [    ]                                     If No, reason:  3.Statin:   Yes [ x  ]                  No  [   ]                  If No, reason:  4.Ecasa:  Yes  [ x  ]                  No   [   ]                  If No, reason:  Follow Up Appointments:  Follow-up Information    Lajuana Matte, MD Follow up on 11/21/2020.   Specialty: Cardiothoracic Surgery Why: Appointment is VIRTUAL. Do NOT go to the office. Appointment 6/3 @ 2:40pm  Contact information: [place] [address] [phone]         [facility] [place]   Why: for a follow up appointment regarding further diabetes management and surveillance of HGA1C 12.3 Contact information: [place] [address] [phone]        Verta Ellen., NP. Go on 11/11/2020.   Specialty: Cardiology Why: Appointment time is at 3:00 pm Contact information: [place] [address] [phone]               Signed: Alvester Chou 10/24/2020, 12:55 PM

## 2020-10-20 NOTE — Progress Notes (Signed)
[facility] BRIEF SICU PROGRESS NOTE  Day of Surgery  S/P Procedure(s) (LRB): CORONARY ARTERY BYPASS GRAFTING (CABG) TIMES FOUR ON PUMP, USING LEFT INTERNAL MAMMARY ARTERY AND RIGHT ENDOSCOPIC GREATER SAPHENOUS VEIN HARVEST CONDUITS (N/A) TRANSESOPHAGEAL ECHOCARDIOGRAM (TEE) (N/A)   Extubated uneventfully NSR w/ stable hemodynamics Breathing comfortably on nasal cannula Chest tube output low UOP adequate Labs okay  Plan: Continue routine early postop  Rexene Alberts, MD 10/20/2020 6:18 PM

## 2020-10-20 NOTE — Brief Op Note (Addendum)
10/14/2020 - 10/20/2020  11:51 AM  PATIENT:  Dwayne Garrett  65 y.o. male  PRE-OPERATIVE DIAGNOSIS:  1. S/p NSTEMI 2.CORONARY ARTERY DISEASE  POST-OPERATIVE DIAGNOSIS:  1. S/p NSTEMI 2.CORONARY ARTERY DISEASE  PROCEDURE:  TRANSESOPHAGEAL ECHOCARDIOGRAM (TEE), CORONARY ARTERY BYPASS GRAFTING (CABG) TIMES 4 (LIMA to LAD, SVG to DIAGONAL, SVG to OM, SVG to PDA)  USING LEFT INTERNAL MAMMARY ARTERY AND RIGHT ENDOSCOPIC GREATER SAPHENOUS VEIN HARVEST CONDUITS  RIGHT EVH HARVEST TIME: 48 minutes;RIGHT EVH PREP TIME:14 minutes  SURGEON:  Surgeon(s) and Role:    Lightfoot, Lucile Crater, MD - Primary  PHYSICIAN ASSISTANT: Lars Pinks PA-C  ANESTHESIA:   general  EBL: Per anesthesia, perfusion record  DRAINS: Chest tubes placed in the mediastinal and pleural spaces   COUNTS CORRECT:  YES  DICTATION: .Dragon Dictation  PLAN OF CARE: Admit to inpatient   PATIENT DISPOSITION:  ICU - intubated and hemodynamically stable.   Delay start of Pharmacological VTE agent (>24hrs) due to surgical blood loss or risk of bleeding: yes  BASELINE WEIGHT: 96.2 kg

## 2020-10-20 NOTE — Plan of Care (Signed)
  Problem: Education: Goal: Knowledge of General Education information will improve Description: Including pain rating scale, medication(s)/side effects and non-pharmacologic comfort measures Outcome: Progressing   Problem: Health Behavior/Discharge Planning: Goal: Ability to manage health-related needs will improve Outcome: Progressing   Problem: Education: Goal: Understanding of cardiac disease, CV risk reduction, and recovery process will improve Outcome: Progressing Goal: Individualized Educational Video(s) Outcome: Progressing   Problem: Activity: Goal: Ability to tolerate increased activity will improve Outcome: Progressing   Problem: Cardiac: Goal: Ability to achieve and maintain adequate cardiovascular perfusion will improve Outcome: Progressing   Problem: Health Behavior/Discharge Planning: Goal: Ability to safely manage health-related needs after discharge will improve Outcome: Progressing   Problem: Education: Goal: Individualized Educational Video(s) Outcome: Completed/Met

## 2020-10-20 NOTE — Op Note (Signed)
[address]       [place] [address]             [phone]                                          10/20/2020 Patient:  Dwayne Garrett Pre-Op Dx: Three-vessel coronary disease   Diabetes mellitus   Hypertension   Hyperlipidemia  Post-op Dx: Same Procedure: CABG X 4, LIMA to LAD, reverse saphenous vein graft to first diagonal, OM1, PDA. Endoscopic greater saphenous vein harvest on the right   Surgeon and Role:      * Shahrzad Koble, Lucile Crater, MD - Primary    *D. Tacy Dura, PA-C- assisting  Anesthesia  general EBL: 500 ml Blood Administration: None Xclamp Time:  67 min Pump Time:  136mn  Drains: 163F blake drain: L, mediastinal  Wires: None Counts: correct   Indications: 65year old male with significant three-vessel coronary artery disease.  His RV and LV function about the preserved, and he has no significant valvular disease.  The risks and benefits of surgical revascularization in the discussion is agreeable to proceed.  Earliest available date will be Monday, May 2.  Findings: Good LIMA, good vein conduit.  Good LAD target.  Small OM 1 target.  Good diagonal target.  Good PDA target.  PLV target was too small for bypass.  Operative Technique: All invasive lines were placed in pre-op holding.  After the risks, benefits and alternatives were thoroughly discussed, the patient was brought to the operative theatre.  Anesthesia was induced, and the patient was prepped and draped in normal sterile fashion.  An appropriate surgical pause was performed, and pre-operative antibiotics were dosed accordingly.  We began with simultaneous incisions along the right leg for harvesting of the greater saphenous vein and the chest for the sternotomy.  In regards to the sternotomy, this was carried down with bovie cautery, and the sternum was divided with a reciprocating saw.  Meticulous hemostasis was obtained.  The left internal thoracic artery was exposed and  harvested in in pedicled fashion.  The patient was systemically heparinized, and the artery was divided distally, and placed in a papaverine sponge.    The sternal elevator was removed, and a retractor was placed.  The pericardium was divided in the midline and fashioned into a cradle with pericardial stitches.   After we confirmed an appropriate ACT, the ascending aorta was cannulated in standard fashion.  The right atrial appendage was used for venous cannulation site.  Cardiopulmonary bypass was initiated, and the heart retractor was placed. The cross clamp was applied, and a dose of anterograde cardioplegia was given with good arrest of the heart.  We moved to the posterior wall of the heart, and found a good target on the PDA.  An arteriotomy was made, and the vein graft was anastomosed to it in an end to side fashion.  Next we exposed the lateral wall, and found a good target on the OM1.  An end to side anastomosis with the vein graft was then created.  Next, we exposed the anterior wall of the heart and identified a good target on first diagonal.   An arteriotomy was created.  The vein was anastomosed in an end to side fashion.  Finally, we exposed a good target on the LAD, and fashioned an end to  side anastomosis between it and the LITA.  We began to re-warm, and a re-animation dose of cardioplegia was given.  The heart was de-aired, and the cross clamp was removed.  Meticulous hemostasis was obtained.    A partial occludding clamp was then placed on the ascending aorta, and we created an end to side anastomosis between it and the proximal vein grafts.  The proximal sites were marked with rings.  Hemostasis was obtained, and we separated from cardiopulmonary bypass without event.the heparin was reversed with protamine.  Chest tubes and wires were placed, and the sternum was re-approximated with with sternal wires.  The soft tissue and skin were re-approximated wth absorbable suture.    The patient  tolerated the procedure without any immediate complications, and was transferred to the ICU in guarded condition.  Joleen Stuckert Bary Leriche

## 2020-10-21 ENCOUNTER — Encounter (HOSPITAL_COMMUNITY): Payer: Self-pay | Admitting: Thoracic Surgery (Cardiothoracic Vascular Surgery)

## 2020-10-21 ENCOUNTER — Inpatient Hospital Stay (HOSPITAL_COMMUNITY): Payer: No Typology Code available for payment source

## 2020-10-21 DIAGNOSIS — I25810 Atherosclerosis of coronary artery bypass graft(s) without angina pectoris: Secondary | ICD-10-CM

## 2020-10-21 LAB — POCT I-STAT 7, (LYTES, BLD GAS, ICA,H+H)
Acid-base deficit: 2 mmol/L (ref 0.0–2.0)
Acid-base deficit: 2 mmol/L (ref 0.0–2.0)
Acid-base deficit: 4 mmol/L — ABNORMAL HIGH (ref 0.0–2.0)
Bicarbonate: 23.5 mmol/L (ref 20.0–28.0)
Bicarbonate: 23.3 mmol/L (ref 20.0–28.0)
Bicarbonate: 22.6 mmol/L (ref 20.0–28.0)
Calcium, Ion: 1.19 mmol/L (ref 1.15–1.40)
Calcium, Ion: 1.24 mmol/L (ref 1.15–1.40)
Calcium, Ion: 1.25 mmol/L (ref 1.15–1.40)
HCT: 33 % — ABNORMAL LOW (ref 39.0–52.0)
HCT: 38 % — ABNORMAL LOW (ref 39.0–52.0)
HCT: 39 % (ref 39.0–52.0)
Hemoglobin: 11.2 g/dL — ABNORMAL LOW (ref 13.0–17.0)
Hemoglobin: 12.9 g/dL — ABNORMAL LOW (ref 13.0–17.0)
Hemoglobin: 13.3 g/dL (ref 13.0–17.0)
O2 Saturation: 95 %
O2 Saturation: 94 %
O2 Saturation: 94 %
Patient temperature: 35.9
Patient temperature: 36.5
Patient temperature: 37.3
Potassium: 3.3 mmol/L — ABNORMAL LOW (ref 3.5–5.1)
Potassium: 4.3 mmol/L (ref 3.5–5.1)
Potassium: 4.3 mmol/L (ref 3.5–5.1)
Sodium: 145 mmol/L (ref 135–145)
Sodium: 143 mmol/L (ref 135–145)
Sodium: 142 mmol/L (ref 135–145)
TCO2: 25 mmol/L (ref 22–32)
TCO2: 25 mmol/L (ref 22–32)
TCO2: 24 mmol/L (ref 22–32)
pCO2 arterial: 40.1 mmHg (ref 32.0–48.0)
pCO2 arterial: 40.5 mmHg (ref 32.0–48.0)
pCO2 arterial: 44 mmHg (ref 32.0–48.0)
pH, Arterial: 7.371 (ref 7.350–7.450)
pH, Arterial: 7.365 (ref 7.350–7.450)
pH, Arterial: 7.319 — ABNORMAL LOW (ref 7.350–7.450)
pO2, Arterial: 75 mmHg — ABNORMAL LOW (ref 83.0–108.0)
pO2, Arterial: 71 mmHg — ABNORMAL LOW (ref 83.0–108.0)
pO2, Arterial: 79 mmHg — ABNORMAL LOW (ref 83.0–108.0)

## 2020-10-21 LAB — CBC
HCT: 37.4 % — ABNORMAL LOW (ref 39.0–52.0)
HCT: 40 % (ref 39.0–52.0)
Hemoglobin: 12.1 g/dL — ABNORMAL LOW (ref 13.0–17.0)
Hemoglobin: 13.1 g/dL (ref 13.0–17.0)
MCH: 30.6 pg (ref 26.0–34.0)
MCH: 30.8 pg (ref 26.0–34.0)
MCHC: 32.4 g/dL (ref 30.0–36.0)
MCHC: 32.8 g/dL (ref 30.0–36.0)
MCV: 94.4 fL (ref 80.0–100.0)
MCV: 94.1 fL (ref 80.0–100.0)
Platelets: 171 10*3/uL (ref 150–400)
Platelets: 165 10*3/uL (ref 150–400)
RBC: 3.96 MIL/uL — ABNORMAL LOW (ref 4.22–5.81)
RBC: 4.25 MIL/uL (ref 4.22–5.81)
RDW: 13.2 % (ref 11.5–15.5)
RDW: 13.2 % (ref 11.5–15.5)
WBC: 13.8 10*3/uL — ABNORMAL HIGH (ref 4.0–10.5)
WBC: 15.9 10*3/uL — ABNORMAL HIGH (ref 4.0–10.5)
nRBC: 0 % (ref 0.0–0.2)
nRBC: 0 % (ref 0.0–0.2)

## 2020-10-21 LAB — MAGNESIUM
Magnesium: 2.1 mg/dL (ref 1.7–2.4)
Magnesium: 2.1 mg/dL (ref 1.7–2.4)

## 2020-10-21 LAB — GLUCOSE, CAPILLARY
Glucose-Capillary: 101 mg/dL — ABNORMAL HIGH (ref 70–99)
Glucose-Capillary: 110 mg/dL — ABNORMAL HIGH (ref 70–99)
Glucose-Capillary: 118 mg/dL — ABNORMAL HIGH (ref 70–99)
Glucose-Capillary: 129 mg/dL — ABNORMAL HIGH (ref 70–99)
Glucose-Capillary: 74 mg/dL (ref 70–99)
Glucose-Capillary: 115 mg/dL — ABNORMAL HIGH (ref 70–99)
Glucose-Capillary: 145 mg/dL — ABNORMAL HIGH (ref 70–99)
Glucose-Capillary: 226 mg/dL — ABNORMAL HIGH (ref 70–99)
Glucose-Capillary: 263 mg/dL — ABNORMAL HIGH (ref 70–99)
Glucose-Capillary: 264 mg/dL — ABNORMAL HIGH (ref 70–99)
Glucose-Capillary: 221 mg/dL — ABNORMAL HIGH (ref 70–99)
Glucose-Capillary: 186 mg/dL — ABNORMAL HIGH (ref 70–99)

## 2020-10-21 LAB — BASIC METABOLIC PANEL
Anion gap: 6 (ref 5–15)
Anion gap: 4 — ABNORMAL LOW (ref 5–15)
BUN: 12 mg/dL (ref 8–23)
BUN: 13 mg/dL (ref 8–23)
CO2: 24 mmol/L (ref 22–32)
CO2: 25 mmol/L (ref 22–32)
Calcium: 8.5 mg/dL — ABNORMAL LOW (ref 8.9–10.3)
Calcium: 8.6 mg/dL — ABNORMAL LOW (ref 8.9–10.3)
Chloride: 109 mmol/L (ref 98–111)
Chloride: 102 mmol/L (ref 98–111)
Creatinine, Ser: 0.62 mg/dL (ref 0.61–1.24)
Creatinine, Ser: 0.81 mg/dL (ref 0.61–1.24)
GFR, Estimated: >60 mL/min (ref >60)
GFR, Estimated: >60 mL/min (ref >60)
Glucose, Bld: 128 mg/dL — ABNORMAL HIGH (ref 70–99)
Glucose, Bld: 258 mg/dL — ABNORMAL HIGH (ref 70–99)
Potassium: 4 mmol/L (ref 3.5–5.1)
Potassium: 3.8 mmol/L (ref 3.5–5.1)
Sodium: 139 mmol/L (ref 135–145)
Sodium: 131 mmol/L — ABNORMAL LOW (ref 135–145)

## 2020-10-21 MED ORDER — CEFAZOLIN SODIUM-DEXTROSE 2-4 GM/100ML-% IV SOLN
2.0000 g | Freq: Three times a day (TID) | INTRAVENOUS | Status: AC
  Administered 2020-10-21 – 2020-10-22 (×2): 2 g via INTRAVENOUS
  Filled 2020-10-21 (×5): qty 100

## 2020-10-21 MED ORDER — INSULIN DETEMIR 100 UNIT/ML ~~LOC~~ SOLN
10.0000 [IU] | Freq: Every day | SUBCUTANEOUS | Status: DC
  Administered 2020-10-22: 10 [IU] via SUBCUTANEOUS
  Filled 2020-10-21: qty 0.1

## 2020-10-21 MED ORDER — ENOXAPARIN SODIUM 40 MG/0.4ML IJ SOSY
40.0000 mg | PREFILLED_SYRINGE | Freq: Every day | INTRAMUSCULAR | Status: DC
  Administered 2020-10-21 – 2020-10-23 (×3): 40 mg via SUBCUTANEOUS
  Filled 2020-10-21 (×3): qty 0.4

## 2020-10-21 MED ORDER — INSULIN ASPART 100 UNIT/ML IJ SOLN
0.0000 [IU] | INTRAMUSCULAR | Status: DC
  Administered 2020-10-21: 12 [IU] via SUBCUTANEOUS
  Administered 2020-10-21: 4 [IU] via SUBCUTANEOUS
  Administered 2020-10-21: 12 [IU] via SUBCUTANEOUS
  Administered 2020-10-21: 8 [IU] via SUBCUTANEOUS
  Administered 2020-10-22: 16 [IU] via SUBCUTANEOUS
  Administered 2020-10-22 (×2): 2 [IU] via SUBCUTANEOUS
  Administered 2020-10-22: 12 [IU] via SUBCUTANEOUS

## 2020-10-21 MED ORDER — INSULIN DETEMIR 100 UNIT/ML ~~LOC~~ SOLN
10.0000 [IU] | Freq: Once | SUBCUTANEOUS | Status: AC
  Administered 2020-10-21: 10 [IU] via SUBCUTANEOUS
  Filled 2020-10-21: qty 0.1

## 2020-10-21 MED FILL — Potassium Chloride Inj 2 mEq/ML: INTRAVENOUS | Qty: 40 | Status: AC

## 2020-10-21 MED FILL — Lidocaine HCl Local Preservative Free (PF) Inj 2%: INTRAMUSCULAR | Qty: 15 | Status: AC

## 2020-10-21 MED FILL — Heparin Sodium (Porcine) Inj 1000 Unit/ML: INTRAMUSCULAR | Qty: 30 | Status: AC

## 2020-10-21 MED FILL — Magnesium Sulfate Inj 50%: INTRAMUSCULAR | Qty: 10 | Status: CN

## 2020-10-21 NOTE — Progress Notes (Signed)
      [address]       [place] [address]             [phone]                 1 Day Post-Op Procedure(s) (LRB): CORONARY ARTERY BYPASS GRAFTING (CABG) TIMES FOUR ON PUMP, USING LEFT INTERNAL MAMMARY ARTERY AND RIGHT ENDOSCOPIC GREATER SAPHENOUS VEIN HARVEST CONDUITS (N/A) TRANSESOPHAGEAL ECHOCARDIOGRAM (TEE) (N/A)   Events: No events _______________________________________________________________ Vitals: BP 111/67   Pulse 72   Temp 99 F (37.2 C) (Bladder)   Resp 18   Ht 5' 9"  (1.753 m)   Wt 103 kg   SpO2 95%   BMI 33.53 kg/m   - Neuro: alert NAD  - Cardiovascular: sinus  Drips: neo 20.   CVP:  [10 mmHg-24 mmHg] 13 mmHg CO:  [4 L/min-6.8 L/min] 6.8 L/min CI:  [2.2 L/min/m2-3.2 L/min/m2] 3.2 L/min/m2  - Pulm: EWOB  SS CT output ABG    Component Value Date/Time   PHART 7.365 10/20/2020 1623   PCO2ART 40.5 10/20/2020 1623   PO2ART 71 (L) 10/20/2020 1623   HCO3 23.3 10/20/2020 1623   TCO2 25 10/20/2020 1623   ACIDBASEDEF 2.0 10/20/2020 1623   O2SAT 94.0 10/20/2020 1623    - Abd: ND - Extremity: trace edema  .Intake/Output      05/02 0701 05/03 0700 05/03 0701 05/04 0700   P.O. 30    I.V. (mL/kg) 3329.5 (32.3) 2.9 (0)   Blood 650    IV Piggyback 1205.6 0   Total Intake(mL/kg) 5215.1 (50.6) 2.9 (0)   Urine (mL/kg/hr) 2875 (1.2)    Blood 1000    Chest Tube 470    Total Output 4345    Net +870.1 +2.9           _______________________________________________________________ Labs: CBC Latest Ref Rng & Units 10/21/2020 10/20/2020 10/20/2020  WBC 4.0 - 10.5 K/uL 13.8(H) 13.1(H) -  Hemoglobin 13.0 - 17.0 g/dL 12.1(L) 13.0 12.9(L)  Hematocrit 39.0 - 52.0 % 37.4(L) 39.5 38.0(L)  Platelets 150 - 400 K/uL 171 157 -   CMP Latest Ref Rng & Units 10/21/2020 10/20/2020 10/20/2020  Glucose 70 - 99 mg/dL 128(H) 121(H) -  BUN 8 - 23 mg/dL 12 8 -  Creatinine 0.61 - 1.24 mg/dL 0.62 0.56(L) -  Sodium 135 - 145 mmol/L 139 140 143  Potassium 3.5 - 5.1  mmol/L 4.0 3.9 4.3  Chloride 98 - 111 mmol/L 109 111 -  CO2 22 - 32 mmol/L 24 24 -  Calcium 8.9 - 10.3 mg/dL 8.5(L) 8.3(L) -  Total Protein 6.5 - 8.1 g/dL - - -  Total Bilirubin 0.3 - 1.2 mg/dL - - -  Alkaline Phos 38 - 126 U/L - - -  AST 15 - 41 U/L - - -  ALT 0 - 44 U/L - - -    CXR: Pv congestion  _______________________________________________________________  Assessment and Plan: POD 1 s/p CABG  Neuro: pain controlled CV: on a/s/bb.  Will remove a line and CVL Pulm: continue pulm toilet Renal: creat stable GI: advancing diet Heme: stable ID: [email or web address] Endo: SSI Dispo: continue ICU care.  Floor likely today   Lajuana Matte 10/21/2020 7:41 AM

## 2020-10-21 NOTE — Progress Notes (Signed)
Patient ID: ORYN CASANOVA, male   DOB: [birth year], 65 y.o.   MRN: [id number]  [facility] Evening Rounds:   Hemodynamically stable   Urine output good  CT output low  CBC    Component Value Date/Time   WBC 15.9 (H) 10/21/2020 1638   RBC 4.25 10/21/2020 1638   HGB 13.1 10/21/2020 1638   HGB 16.0 07/18/2019 1459   HCT 40.0 10/21/2020 1638   HCT 46.3 07/18/2019 1459   PLT 165 10/21/2020 1638   PLT 222 07/18/2019 1459   MCV 94.1 10/21/2020 1638   MCV 90 07/18/2019 1459   MCH 30.8 10/21/2020 1638   MCHC 32.8 10/21/2020 1638   RDW 13.2 10/21/2020 1638   RDW 12.2 07/18/2019 1459   LYMPHSABS 2.3 12/09/2014 1640   MONOABS 0.8 12/09/2014 1640   EOSABS 0.2 12/09/2014 1640   BASOSABS 0.1 12/09/2014 1640     BMET    Component Value Date/Time   NA 131 (L) 10/21/2020 1638   NA 141 07/18/2019 1459   K 3.8 10/21/2020 1638   CL 102 10/21/2020 1638   CO2 25 10/21/2020 1638   GLUCOSE 258 (H) 10/21/2020 1638   BUN 13 10/21/2020 1638   BUN 17 07/18/2019 1459   CREATININE 0.81 10/21/2020 1638   CREATININE 0.75 10/25/2016 1513   CALCIUM 8.6 (L) 10/21/2020 1638   GFRNONAA >60 10/21/2020 1638   GFRAA 118 07/18/2019 1459     A/P:  Stable postop course. Continue current plans

## 2020-10-21 NOTE — Progress Notes (Signed)
Pharmacy note: post-op antibiotics   Due to critical shortage of cefuroxime, post-op antibiotics will be change to ancef   Plan -Ancef 2gm IV 8hr x6 doses  Hildred Laser, PharmD Clinical Pharmacist **Pharmacist phone directory can now be found on [email or web address] ([email or web address]).  Listed under [place].

## 2020-10-21 NOTE — Progress Notes (Signed)
   Awake and neurologically intact.  LIMA to LAD, SVG to all the branches x3  No A. Fib.  ECG this a.m. reveals very minimal diffuse ST elevation.  No acute injury pattern.  No significant Q waves.

## 2020-10-22 ENCOUNTER — Inpatient Hospital Stay (HOSPITAL_COMMUNITY): Payer: No Typology Code available for payment source

## 2020-10-22 DIAGNOSIS — E118 Type 2 diabetes mellitus with unspecified complications: Secondary | ICD-10-CM

## 2020-10-22 LAB — BASIC METABOLIC PANEL
Anion gap: 4 — ABNORMAL LOW (ref 5–15)
BUN: 12 mg/dL (ref 8–23)
CO2: 26 mmol/L (ref 22–32)
Calcium: 8.5 mg/dL — ABNORMAL LOW (ref 8.9–10.3)
Chloride: 103 mmol/L (ref 98–111)
Creatinine, Ser: 0.76 mg/dL (ref 0.61–1.24)
GFR, Estimated: >60 mL/min (ref >60)
Glucose, Bld: 179 mg/dL — ABNORMAL HIGH (ref 70–99)
Potassium: 4.1 mmol/L (ref 3.5–5.1)
Sodium: 133 mmol/L — ABNORMAL LOW (ref 135–145)

## 2020-10-22 LAB — CBC
HCT: 34.5 % — ABNORMAL LOW (ref 39.0–52.0)
Hemoglobin: 11.2 g/dL — ABNORMAL LOW (ref 13.0–17.0)
MCH: 30.9 pg (ref 26.0–34.0)
MCHC: 32.5 g/dL (ref 30.0–36.0)
MCV: 95 fL (ref 80.0–100.0)
Platelets: 156 10*3/uL (ref 150–400)
RBC: 3.63 MIL/uL — ABNORMAL LOW (ref 4.22–5.81)
RDW: 13.2 % (ref 11.5–15.5)
WBC: 16.6 10*3/uL — ABNORMAL HIGH (ref 4.0–10.5)
nRBC: 0 % (ref 0.0–0.2)

## 2020-10-22 LAB — GLUCOSE, CAPILLARY
Glucose-Capillary: 141 mg/dL — ABNORMAL HIGH (ref 70–99)
Glucose-Capillary: 137 mg/dL — ABNORMAL HIGH (ref 70–99)
Glucose-Capillary: 252 mg/dL — ABNORMAL HIGH (ref 70–99)
Glucose-Capillary: 321 mg/dL — ABNORMAL HIGH (ref 70–99)
Glucose-Capillary: 198 mg/dL — ABNORMAL HIGH (ref 70–99)

## 2020-10-22 MED ORDER — FUROSEMIDE 10 MG/ML IJ SOLN
40.0000 mg | Freq: Once | INTRAMUSCULAR | Status: AC
  Administered 2020-10-22: 40 mg via INTRAVENOUS
  Filled 2020-10-22: qty 4

## 2020-10-22 MED ORDER — METOPROLOL TARTRATE 25 MG PO TABS
25.0000 mg | ORAL_TABLET | Freq: Two times a day (BID) | ORAL | Status: DC
  Administered 2020-10-22 – 2020-10-24 (×5): 25 mg via ORAL
  Filled 2020-10-22 (×5): qty 1

## 2020-10-22 MED ORDER — INSULIN ASPART 100 UNIT/ML IJ SOLN
4.0000 [IU] | Freq: Three times a day (TID) | INTRAMUSCULAR | Status: DC
  Administered 2020-10-23 – 2020-10-24 (×4): 4 [IU] via SUBCUTANEOUS

## 2020-10-22 MED ORDER — FUROSEMIDE 40 MG PO TABS: 40.0000 mg | ORAL_TABLET | Freq: Every day | ORAL | Status: DC

## 2020-10-22 MED ORDER — SODIUM CHLORIDE 0.9% FLUSH: 3.0000 mL | Freq: Two times a day (BID) | INTRAVENOUS | Status: DC

## 2020-10-22 MED ORDER — SODIUM CHLORIDE 0.9% FLUSH: 3.0000 mL | INTRAVENOUS | Status: DC | PRN

## 2020-10-22 MED ORDER — POTASSIUM CHLORIDE CRYS ER 20 MEQ PO TBCR: 20.0000 meq | EXTENDED_RELEASE_TABLET | Freq: Every day | ORAL | Status: DC

## 2020-10-22 MED ORDER — INSULIN ASPART 100 UNIT/ML IJ SOLN
0.0000 [IU] | Freq: Three times a day (TID) | INTRAMUSCULAR | Status: DC
  Administered 2020-10-23: 5 [IU] via SUBCUTANEOUS
  Administered 2020-10-23 – 2020-10-24 (×3): 3 [IU] via SUBCUTANEOUS
  Administered 2020-10-24: 5 [IU] via SUBCUTANEOUS

## 2020-10-22 MED ORDER — INSULIN DETEMIR 100 UNIT/ML ~~LOC~~ SOLN
30.0000 [IU] | Freq: Two times a day (BID) | SUBCUTANEOUS | Status: DC
  Filled 2020-10-22 (×4): qty 0.3

## 2020-10-22 MED ORDER — METOPROLOL TARTRATE 25 MG/10 ML ORAL SUSPENSION
25.0000 mg | Freq: Two times a day (BID) | ORAL | Status: DC
  Filled 2020-10-22 (×5): qty 10

## 2020-10-22 MED ORDER — ~~LOC~~ CARDIAC SURGERY, PATIENT & FAMILY EDUCATION: Freq: Once | Status: AC

## 2020-10-22 MED ORDER — SODIUM CHLORIDE 0.9 % IV SOLN: 250.0000 mL | INTRAVENOUS | Status: DC | PRN

## 2020-10-22 MED ORDER — POTASSIUM CHLORIDE CRYS ER 20 MEQ PO TBCR
40.0000 meq | EXTENDED_RELEASE_TABLET | Freq: Every day | ORAL | Status: DC
  Administered 2020-10-22 – 2020-10-24 (×3): 40 meq via ORAL
  Filled 2020-10-22 (×3): qty 2

## 2020-10-22 MED ORDER — FUROSEMIDE 40 MG PO TABS
40.0000 mg | ORAL_TABLET | Freq: Every day | ORAL | Status: DC
  Administered 2020-10-23 – 2020-10-24 (×2): 40 mg via ORAL
  Filled 2020-10-22 (×2): qty 1

## 2020-10-22 MED ORDER — INSULIN ASPART 100 UNIT/ML IJ SOLN
0.0000 [IU] | Freq: Every day | INTRAMUSCULAR | Status: DC
  Administered 2020-10-23: 2 [IU] via SUBCUTANEOUS

## 2020-10-22 MED FILL — Heparin Sodium (Porcine) Inj 1000 Unit/ML: INTRAMUSCULAR | Qty: 20 | Status: AC

## 2020-10-22 MED FILL — Mannitol IV Soln 20%: INTRAVENOUS | Qty: 500 | Status: AC

## 2020-10-22 MED FILL — Calcium Chloride Inj 10%: INTRAVENOUS | Qty: 10 | Status: AC

## 2020-10-22 MED FILL — Sodium Bicarbonate IV Soln 8.4%: INTRAVENOUS | Qty: 50 | Status: AC

## 2020-10-22 MED FILL — Sodium Chloride IV Soln 0.9%: INTRAVENOUS | Qty: 2000 | Status: AC

## 2020-10-22 MED FILL — Electrolyte-R (PH 7.4) Solution: INTRAVENOUS | Qty: 4000 | Status: AC

## 2020-10-22 NOTE — Progress Notes (Signed)
      [address]       [place] [address]             [phone]    POD # 2  CABG x 4   Up in chair  BP (!) 141/79   Pulse 87   Temp 98.9 F (37.2 C) (Oral)   Resp (!) 22   Ht 5' 9"  (1.753 m)   Wt 101.8 kg   SpO2 92%   BMI 33.14 kg/m   Intake/Output Summary (Last 24 hours) at 10/22/2020 1824 Last data filed at 10/22/2020 0900 Gross per 24 hour  Intake --  Output 2133 ml  Net -2133 ml   CBG elevated this PM- will address  Remo Lipps C. Roxan Hockey, MD [facility] [phone]

## 2020-10-22 NOTE — Progress Notes (Addendum)
[facility] DAILY ICU PROGRESS NOTE                   [address]            [place] [address]          [phone]   2 Days Post-Op Procedure(s) (LRB): CORONARY ARTERY BYPASS GRAFTING (CABG) TIMES FOUR ON PUMP, USING LEFT INTERNAL MAMMARY ARTERY AND RIGHT ENDOSCOPIC GREATER SAPHENOUS VEIN HARVEST CONDUITS (N/A) TRANSESOPHAGEAL ECHOCARDIOGRAM (TEE) (N/A)  Total Length of Stay:  LOS: 6 days   Subjective: Patient states had pain, gas like feeling left side of chest and shoulder earlier this am. He states heating pad helped a little.  Objective: Vital signs in last 24 hours: Temp:  [98 F (36.7 C)-99.1 F (37.3 C)] 98 F (36.7 C) (05/04 0656) Pulse Rate:  [69-104] 86 (05/04 0700) Cardiac Rhythm: Normal sinus rhythm (05/04 0400) Resp:  [16-29] 26 (05/04 0700) BP: (92-137)/(61-80) 129/80 (05/04 0700) SpO2:  [92 %-99 %] 96 % (05/04 0700) Arterial Line BP: (129)/(59) 129/59 (05/03 0800) Weight:  [101.8 kg] 101.8 kg (05/04 0600)  Filed Weights   10/20/20 0632 10/21/20 0500 10/22/20 0600  Weight: 96.2 kg 103 kg 101.8 kg    Weight change: -1.2 kg   Hemodynamic parameters for last 24 hours:    Intake/Output from previous day: 05/03 0701 - 05/04 0700 In: 28.8 [I.V.:28.8] Out: 1423 [Urine:1325; Chest Tube:98]  Intake/Output this shift: No intake/output data recorded.  Current Meds: Scheduled Meds: . acetaminophen  1,000 mg Oral Q6H   Or  . acetaminophen (TYLENOL) oral liquid 160 mg/5 mL  1,000 mg Per Tube Q6H  . aspirin EC  325 mg Oral Daily   Or  . aspirin  324 mg Per Tube Daily  . bisacodyl  10 mg Oral Daily   Or  . bisacodyl  10 mg Rectal Daily  . Chlorhexidine Gluconate Cloth  6 each Topical Daily  . docusate sodium  200 mg Oral Daily  . enoxaparin (LOVENOX) injection  40 mg Subcutaneous QHS  . gabapentin  300 mg Oral Daily  . insulin aspart  0-24 Units Subcutaneous Q4H  . insulin detemir  10 Units Subcutaneous Daily  . metoprolol tartrate  12.5 mg  Oral BID   Or  . metoprolol tartrate  12.5 mg Per Tube BID  . pantoprazole  40 mg Oral Daily  . rosuvastatin  20 mg Oral Daily  . sodium chloride flush  3 mL Intravenous Q12H   Continuous Infusions: . sodium chloride Stopped (10/21/20 0553)  . sodium chloride    . sodium chloride Stopped (10/20/20 1341)  .  ceFAZolin (ANCEF) IV 2 g (10/22/20 0708)  . insulin Stopped (10/21/20 0540)  . lactated ringers    . lactated ringers Stopped (10/20/20 1341)  . lactated ringers Stopped (10/21/20 1116)  . niCARDipine Stopped (10/20/20 1938)  . nitroGLYCERIN Stopped (10/20/20 1404)  . phenylephrine (NEO-SYNEPHRINE) Adult infusion Stopped (10/21/20 0709)   PRN Meds:.sodium chloride, dextrose, lactated ringers, metoprolol tartrate, midazolam, morphine injection, ondansetron (ZOFRAN) IV, oxyCODONE, sodium chloride flush, traMADol  General appearance: alert, cooperative and no distress Neurologic: intact Heart: RRR, no murmur Lungs: Diminished bibasilar breath sounds Abdomen: Soft, non tender, bowel sounds present Extremities: Mild LE edema Wound: Aquacel intact;right lower leg wounds clean and dry  Lab Results: CBC: Recent Labs    10/21/20 1638 10/22/20 0022  WBC 15.9* 16.6*  HGB 13.1 11.2*  HCT 40.0 34.5*  PLT 165 156   BMET:  Recent Labs    10/21/20 1638 10/22/20 0022  NA 131* 133*  K 3.8 4.1  CL 102 103  CO2 25 26  GLUCOSE 258* 179*  BUN 13 12  CREATININE 0.81 0.76  CALCIUM 8.6* 8.5*    CMET: Lab Results  Component Value Date   WBC 16.6 (H) 10/22/2020   HGB 11.2 (L) 10/22/2020   HCT 34.5 (L) 10/22/2020   PLT 156 10/22/2020   GLUCOSE 179 (H) 10/22/2020   CHOL 266 (H) 10/15/2020   TRIG 161 (H) 10/15/2020   HDL 32 (L) 10/15/2020   LDLCALC 202 (H) 10/15/2020   ALT 109 (H) 10/15/2020   AST 98 (H) 10/15/2020   NA 133 (L) 10/22/2020   K 4.1 10/22/2020   CL 103 10/22/2020   CREATININE 0.76 10/22/2020   BUN 12 10/22/2020   CO2 26 10/22/2020   TSH 0.814.Test  methodology is 3rd generation TSH 09/28/2009   INR 1.2 10/20/2020   HGBA1C 12.4 (H) 10/15/2020    PT/INR:  Recent Labs    10/20/20 1323  LABPROT 15.4*  INR 1.2   Radiology: [facility] Chest Port 1 View  Result Date: 10/22/2020 CLINICAL DATA:  Shortness of breath.  Pleural effusion. EXAM: PORTABLE CHEST 1 VIEW COMPARISON:  10/21/2020. FINDINGS: Interim removal of right IJ line. Mediastinal drainage catheter and left chest tube in stable position. Prior CABG. Cardiomegaly. Mild bilateral interstitial prominence again noted. Persistent left base atelectasis/infiltrate. Tiny bilateral pleural effusions again noted. No pneumothorax. IMPRESSION: 1. Interim removal of right IJ line. Mediastinal drainage catheter left chest tube in stable position. No pneumothorax. 2. Prior CABG. Cardiomegaly. Mild bilateral interstitial prominence again noted. Persistent left base atelectasis/infiltrate. Tiny bilateral pleural effusions again noted. Electronically Signed   By: Marcello Moores  Register   On: 10/22/2020 06:30    Assessment/Plan: S/P Procedure(s) (LRB): CORONARY ARTERY BYPASS GRAFTING (CABG) TIMES FOUR ON PUMP, USING LEFT INTERNAL MAMMARY ARTERY AND RIGHT ENDOSCOPIC GREATER SAPHENOUS VEIN HARVEST CONDUITS (N/A) TRANSESOPHAGEAL ECHOCARDIOGRAM (TEE) (N/A)   1. CV-SR with HR in the 80's this am. On Lopressor 12.5 mg bid but will increase for better HR control. 2. Pulmonary-On 3 liters of oxygen via [place]. Wean as able. CXR this am shows no pneumothorax, cardiomegaly, mild CHF, and small b/l pleural effusions, and left base atelectasis.  Encourage incentive spirometer. 3. Volume overload 4. Expected post op blood loss anemia-H and H this am decreased to 11.2 and 34.5. Monitor 5. DM-CBGs  186/141/137. On Insulin. Will restart Metformin closer to discharge.  Pre op HGA1C 12.4. Will need close medical follow up after discharge 6. Regarding symptoms left side of chest, likely related to surgery. Will monitor 7. Transfer for  4E    Dwayne Skillern PA-C 10/22/2020 7:39 AM   Agree with above Doing well Will transfer to 4E Will remove chest tubes Gentle diuresis today Increasing BB  Darrick Greenlaw O Cassy Sprowl

## 2020-10-22 NOTE — Progress Notes (Signed)
   No arrhythmia  Prior to discharge, we need to consider whether starting an SGLT2 is possible based upon resources.  This will be a long-term benefit to the patient.

## 2020-10-22 NOTE — Progress Notes (Signed)
Pt transferred to 4E via wheelchair, monitor in place, VSS, no issues during transport.

## 2020-10-22 NOTE — Progress Notes (Signed)
Inpatient Diabetes Program Recommendations  AACE/ADA: New Consensus Statement on Inpatient Glycemic Control   Target Ranges:  Prepandial:   less than 140 mg/dL      Peak postprandial:   less than 180 mg/dL (1-2 hours)      Critically ill patients:  140 - 180 mg/dL  Results for CRAIGORY, TOSTE (MRN [id number]) as of 10/22/2020 08:24  Ref. Range 10/21/2020 08:02 10/21/2020 10:05 10/21/2020 11:16 10/21/2020 15:14 10/21/2020 19:27 10/21/2020 23:22 10/22/2020 03:09 10/22/2020 06:54  Glucose-Capillary Latest Ref Range: 70 - 99 mg/dL 145 (H) 226 (H) 263 (H) 264 (H) 221 (H) 186 (H) 141 (H) 137 (H)    Review of Glycemic Control  Current orders for Inpatient glycemic control: Levemir 10 units daily, Novolog 0-24 units Q4H  Inpatient Diabetes Program Recommendations:    Insulin: Please consider increasing Levemir to 15 units daily and if increased please order one time Levemir 5 units x1 now for total of 15 units today (already given 10 units this morning). Also, please consider ordering Novolog 5 units TID with meals for meal coverage if patient eats at least 50% of meals.  Thanks, Barnie Alderman, RN, MSN, CDE Diabetes Coordinator Inpatient Diabetes Program [phone] (Team Pager from 8am to 5pm)

## 2020-10-23 ENCOUNTER — Encounter (HOSPITAL_COMMUNITY): Payer: Self-pay | Admitting: Thoracic Surgery (Cardiothoracic Vascular Surgery)

## 2020-10-23 ENCOUNTER — Inpatient Hospital Stay (HOSPITAL_COMMUNITY): Payer: No Typology Code available for payment source

## 2020-10-23 DIAGNOSIS — Z951 Presence of aortocoronary bypass graft: Secondary | ICD-10-CM

## 2020-10-23 DIAGNOSIS — I484 Atypical atrial flutter: Secondary | ICD-10-CM

## 2020-10-23 LAB — CBC
HCT: 34.2 % — ABNORMAL LOW (ref 39.0–52.0)
Hemoglobin: 11.2 g/dL — ABNORMAL LOW (ref 13.0–17.0)
MCH: 30.5 pg (ref 26.0–34.0)
MCHC: 32.7 g/dL (ref 30.0–36.0)
MCV: 93.2 fL (ref 80.0–100.0)
Platelets: 191 10*3/uL (ref 150–400)
RBC: 3.67 MIL/uL — ABNORMAL LOW (ref 4.22–5.81)
RDW: 12.8 % (ref 11.5–15.5)
WBC: 13.6 10*3/uL — ABNORMAL HIGH (ref 4.0–10.5)
nRBC: 0 % (ref 0.0–0.2)

## 2020-10-23 LAB — TYPE AND SCREEN
ABO/RH(D): O NEG
Antibody Screen: NEGATIVE
Unit division: 0
Unit division: 0

## 2020-10-23 LAB — BASIC METABOLIC PANEL
Anion gap: 7 (ref 5–15)
BUN: 12 mg/dL (ref 8–23)
CO2: 27 mmol/L (ref 22–32)
Calcium: 8.6 mg/dL — ABNORMAL LOW (ref 8.9–10.3)
Chloride: 103 mmol/L (ref 98–111)
Creatinine, Ser: 0.69 mg/dL (ref 0.61–1.24)
GFR, Estimated: >60 mL/min (ref >60)
Glucose, Bld: 170 mg/dL — ABNORMAL HIGH (ref 70–99)
Potassium: 3.8 mmol/L (ref 3.5–5.1)
Sodium: 137 mmol/L (ref 135–145)

## 2020-10-23 LAB — GLUCOSE, CAPILLARY
Glucose-Capillary: 177 mg/dL — ABNORMAL HIGH (ref 70–99)
Glucose-Capillary: 215 mg/dL — ABNORMAL HIGH (ref 70–99)
Glucose-Capillary: 199 mg/dL — ABNORMAL HIGH (ref 70–99)
Glucose-Capillary: 240 mg/dL — ABNORMAL HIGH (ref 70–99)

## 2020-10-23 LAB — BPAM RBC
Blood Product Expiration Date: 202205072359
Blood Product Expiration Date: 202205092359
ISSUE DATE / TIME: 202204301147
ISSUE DATE / TIME: 202204301158
Unit Type and Rh: 9500
Unit Type and Rh: 9500

## 2020-10-23 LAB — MAGNESIUM: Magnesium: 1.7 mg/dL (ref 1.7–2.4)

## 2020-10-23 MED ORDER — GUAIFENESIN ER 600 MG PO TB12
1200.0000 mg | ORAL_TABLET | Freq: Two times a day (BID) | ORAL | Status: DC
  Administered 2020-10-23 – 2020-10-24 (×2): 1200 mg via ORAL
  Filled 2020-10-23 (×2): qty 2

## 2020-10-23 MED ORDER — INSULIN DETEMIR 100 UNIT/ML ~~LOC~~ SOLN
10.0000 [IU] | Freq: Every day | SUBCUTANEOUS | Status: DC
  Administered 2020-10-23: 10 [IU] via SUBCUTANEOUS
  Filled 2020-10-23 (×2): qty 0.1

## 2020-10-23 MED ORDER — AMIODARONE HCL IN DEXTROSE 360-4.14 MG/200ML-% IV SOLN
60.0000 mg/h | INTRAVENOUS | Status: AC
  Administered 2020-10-23 (×2): 60 mg/h via INTRAVENOUS
  Filled 2020-10-23: qty 200

## 2020-10-23 MED ORDER — INSULIN DETEMIR 100 UNIT/ML ~~LOC~~ SOLN: 10.0000 [IU] | Freq: Every day | SUBCUTANEOUS | Status: DC

## 2020-10-23 MED ORDER — AMIODARONE IV BOLUS ONLY 150 MG/100ML
150.0000 mg | Freq: Once | INTRAVENOUS | Status: DC
  Filled 2020-10-23: qty 100

## 2020-10-23 MED ORDER — AMIODARONE HCL IN DEXTROSE 360-4.14 MG/200ML-% IV SOLN: 30.0000 mg/h | INTRAVENOUS | Status: DC

## 2020-10-23 MED ORDER — AMIODARONE HCL IN DEXTROSE 360-4.14 MG/200ML-% IV SOLN
INTRAVENOUS | Status: AC
  Filled 2020-10-23: qty 200

## 2020-10-23 MED ORDER — AMIODARONE LOAD VIA INFUSION
150.0000 mg | Freq: Once | INTRAVENOUS | Status: AC
  Administered 2020-10-23: 150 mg via INTRAVENOUS
  Filled 2020-10-23: qty 83.34

## 2020-10-23 MED ORDER — AMIODARONE HCL 200 MG PO TABS
400.0000 mg | ORAL_TABLET | Freq: Two times a day (BID) | ORAL | Status: DC
  Administered 2020-10-23 – 2020-10-24 (×3): 400 mg via ORAL
  Filled 2020-10-23 (×3): qty 2

## 2020-10-23 NOTE — Progress Notes (Signed)
CCMD called RN to advise that patient was in atrial flutter HR in the 140s.  Stat EKG obtained, EKG showed atrial flutter.  Patient HR was in the 140s for approx 3 mins, patient self converted to NSR HR in the 80s with no interventions.  Follow up EKG showed that patient was in sinus rhythm.  EKGs saved in Epic as well as shadow chart  RN will continue to monitor patient

## 2020-10-23 NOTE — Progress Notes (Signed)
Mobility Specialist - Progress Note   10/23/20 1340  Mobility  Activity Ambulated in hall  Level of Assistance Standby assist, set-up cues, supervision of patient - no hands on  Assistive Device Front wheel walker  Distance Ambulated (ft) 940 ft  Mobility Response Tolerated well  Mobility performed by Mobility specialist  $Mobility charge 1 Mobility   Pt asx throughout ambulation. VSS throughout. Pt to recliner after walk, call bell at side and visitor in room.   Pricilla Handler Mobility Specialist Mobility Specialist Phone: [phone]

## 2020-10-23 NOTE — Progress Notes (Addendum)
[address]       [place] [address]             [phone]      3 Days Post-Op Procedure(s) (LRB): CORONARY ARTERY BYPASS GRAFTING (CABG) TIMES FOUR ON PUMP, USING LEFT INTERNAL MAMMARY ARTERY AND RIGHT ENDOSCOPIC GREATER SAPHENOUS VEIN HARVEST CONDUITS (N/A) TRANSESOPHAGEAL ECHOCARDIOGRAM (TEE) (N/A) Subjective: Awake and alert. Says he feels good and that he is making progress.  Had a brief episode of atrial fib / flutter at ~1:30am today. This spontaneously converted back to SR. He is being loaded with IV amiodarone.   BM this morning.   Objective: Vital signs in last 24 hours: Temp:  [98 F (36.7 C)-99.4 F (37.4 C)] 99 F (37.2 C) (05/05 0832) Pulse Rate:  [75-143] 80 (05/05 0830) Cardiac Rhythm: Normal sinus rhythm (05/05 0806) Resp:  [20-24] 20 (05/05 0830) BP: (121-160)/(66-90) 133/77 (05/05 0830) SpO2:  [90 %-100 %] 100 % (05/05 0830) Weight:  [102.9 kg] 102.9 kg (05/05 0529)    Intake/Output from previous day: 05/04 0701 - 05/05 0700 In: -  Out: 1200 [Urine:1200] Intake/Output this shift: No intake/output data recorded.  General appearance: alert, cooperative and no distress Neurologic: intact Heart: regular rate and rhythm Lungs: Breath sounds are CTA, Diminished bases Extremities: Trace LE edema.The RLE EVH incisions are intact and dry.  Wound: the sternotomy incision is covered with a dry Aquacel dressing.   Lab Results: Recent Labs    10/22/20 0022 10/23/20 0221  WBC 16.6* 13.6*  HGB 11.2* 11.2*  HCT 34.5* 34.2*  PLT 156 191   BMET:  Recent Labs    10/22/20 0022 10/23/20 0221  NA 133* 137  K 4.1 3.8  CL 103 103  CO2 26 27  GLUCOSE 179* 170*  BUN 12 12  CREATININE 0.76 0.69  CALCIUM 8.5* 8.6*    PT/INR:  Recent Labs    10/20/20 1323  LABPROT 15.4*  INR 1.2   ABG    Component Value Date/Time   PHART 7.319 (L) 10/20/2020 1731   HCO3 22.6 10/20/2020 1731   TCO2 24 10/20/2020 1731   ACIDBASEDEF 4.0  (H) 10/20/2020 1731   O2SAT 94.0 10/20/2020 1731   CBG (last 3)  Recent Labs    10/22/20 1542 10/22/20 2143 10/23/20 0620  GLUCAP 321* 198* 177*    Assessment/Plan: S/P Procedure(s) (LRB): CORONARY ARTERY BYPASS GRAFTING (CABG) TIMES FOUR ON PUMP, USING LEFT INTERNAL MAMMARY ARTERY AND RIGHT ENDOSCOPIC GREATER SAPHENOUS VEIN HARVEST CONDUITS (N/A) TRANSESOPHAGEAL ECHOCARDIOGRAM (TEE) (N/A)  -POD1 CABG x 4 after presenting with acute NSTEMI. EF 60-65%.  Progressing well, continue ASA, statin, metoprolol. OK to remove the sternal dressing today.   -Post-op atrial fibrillation / flutter- back in NSR, receiving IV amiodarone load. K+ 3.8, replace and check Mg++.  Convert amiodarone to PO.   -HTN- MAP ~90-100. On Coreg 41m BID, losartan 10439mdaily, and amlodipine 39m26maily at home.  Will increase the metoprolol to 18m71m daily.   -Mild expected acute blood loss anemia- monitor.   -Type 2 DM- Glucose [phone]--currently on 30units BID levemir and 4u novalog with meals. Continue this with SSI for now.   -Volume excess-Wt 16Lbs + from pre-op Wt. Continue diuresis.   -DVT PPX- continue enoxaparin.    LOS: 7 days    MyroAntony [place]de[place].[phone] 10/23/2020  Atrial fibrillation overnight.  Now back in sinus. Overall doing well. Will consult respiratory therapy for CPAP at night.  Sparsh Callens Bary Leriche

## 2020-10-23 NOTE — Progress Notes (Signed)
   Very brief a flutter last p.m./early a.m. Has subsequently reverted and is maintaining sinus rhythm.  Amiodarone was started and therefore apparently will be used for several weeks postop to suppress recurrent arrhythmia.  No new recommendations.

## 2020-10-23 NOTE — Progress Notes (Signed)
   10/23/20 0315  Provider Notification  Provider Name/Title Roxan Hockey  Date Provider Notified 10/23/20  Time Provider Notified 0235  Notification Type Page  Notification Reason Change in status (Patient converted to Afib HR 140-150s)  Provider response See new orders  Date of Provider Response 10/23/20  Time of Provider Response [phone]

## 2020-10-23 NOTE — Progress Notes (Signed)
CARDIAC REHAB PHASE I   PRE:  Rate/Rhythm: 75 SR  BP:  Supine:   Sitting: 144/90  Standing:    SaO2: 98% 2L   MODE:  Ambulation: 470 ft   POST:  Rate/Rhythm: 86 SR  BP:  Supine:   Sitting: 153/92  Standing:    SaO2: 94%RA 1021-1050 Pt walked 470 ft on RA with rolling walker and asst x 1 with steady gait. Sats good on RA so left off oxygen. Put oxygen tubing close to pt so he can put on if needed. Encouraged IS. Remained in NSR. Encouraged two more walks with staff later. Brother in room.   Graylon Good, RN BSN  10/23/2020 10:47 AM

## 2020-10-24 ENCOUNTER — Other Ambulatory Visit (HOSPITAL_COMMUNITY): Payer: Self-pay

## 2020-10-24 LAB — GLUCOSE, CAPILLARY
Glucose-Capillary: 171 mg/dL — ABNORMAL HIGH (ref 70–99)
Glucose-Capillary: 212 mg/dL — ABNORMAL HIGH (ref 70–99)

## 2020-10-24 MED ORDER — FUROSEMIDE 40 MG PO TABS: 40.0000 mg | ORAL_TABLET | Freq: Every day | ORAL | 0 refills | Status: DC

## 2020-10-24 MED ORDER — LOSARTAN POTASSIUM 50 MG PO TABS
50.0000 mg | ORAL_TABLET | Freq: Every day | ORAL | Status: DC
  Administered 2020-10-24: 50 mg via ORAL

## 2020-10-24 MED ORDER — METOPROLOL TARTRATE 25 MG PO TABS: 25.0000 mg | ORAL_TABLET | Freq: Two times a day (BID) | ORAL | 1 refills | Status: DC

## 2020-10-24 MED ORDER — LOSARTAN POTASSIUM 50 MG PO TABS: 50.0000 mg | ORAL_TABLET | Freq: Every day | ORAL | 1 refills | Status: DC

## 2020-10-24 MED ORDER — ACETAMINOPHEN 500 MG PO TABS
1000.0000 mg | ORAL_TABLET | Freq: Four times a day (QID) | ORAL | 0 refills | Status: DC
Start: 2020-10-24 — End: 2020-11-11

## 2020-10-24 MED ORDER — ROSUVASTATIN CALCIUM 20 MG PO TABS: 20.0000 mg | ORAL_TABLET | Freq: Every day | ORAL | 1 refills | Status: DC

## 2020-10-24 MED ORDER — INSULIN DETEMIR 100 UNIT/ML ~~LOC~~ SOLN
15.0000 [IU] | Freq: Every day | SUBCUTANEOUS | Status: DC
  Filled 2020-10-24: qty 0.15

## 2020-10-24 MED ORDER — HYDROCHLOROTHIAZIDE 25 MG PO TABS: 25.0000 mg | ORAL_TABLET | Freq: Every day | ORAL | 1 refills | Status: DC

## 2020-10-24 MED ORDER — METOPROLOL TARTRATE 25 MG PO TABS
25.0000 mg | ORAL_TABLET | Freq: Two times a day (BID) | ORAL | 1 refills | Status: DC
  Filled 2020-10-24: qty 60, 30d supply, fill #0

## 2020-10-24 MED ORDER — OXYCODONE HCL 5 MG PO TABS
5.0000 mg | ORAL_TABLET | Freq: Four times a day (QID) | ORAL | 0 refills | Status: DC | PRN
  Filled 2020-10-24: qty 4, 1d supply, fill #0

## 2020-10-24 MED ORDER — POTASSIUM CHLORIDE CRYS ER 20 MEQ PO TBCR: 40.0000 meq | EXTENDED_RELEASE_TABLET | Freq: Every day | ORAL | 0 refills | Status: DC

## 2020-10-24 MED ORDER — AMIODARONE HCL 200 MG PO TABS: ORAL_TABLET | ORAL | 1 refills | Status: DC

## 2020-10-24 MED ORDER — OXYCODONE HCL 5 MG PO TABS: 5.0000 mg | ORAL_TABLET | Freq: Four times a day (QID) | ORAL | 0 refills | Status: DC | PRN

## 2020-10-24 MED ORDER — AMIODARONE HCL 200 MG PO TABS
ORAL_TABLET | ORAL | 1 refills | Status: DC
  Filled 2020-10-24: qty 80, 30d supply, fill #0

## 2020-10-24 MED ORDER — ASPIRIN 325 MG PO TBEC: 325.0000 mg | DELAYED_RELEASE_TABLET | Freq: Every day | ORAL | 0 refills | Status: DC

## 2020-10-24 NOTE — Progress Notes (Signed)
Mobility Specialist: Progress Note   10/24/20 1157  Mobility  Activity Ambulated in hall  Level of Assistance Independent  Assistive Device None  Distance Ambulated (ft) 470 ft  Mobility Response Tolerated well  Mobility performed by Mobility specialist  Bed Position Chair  $Mobility charge 1 Mobility   Pre-Mobility: 79 HR Post-Mobility: 89 HR, 94% SpO2  Pt c/o feeling SOB after 235' requiring a brief standing break. Pt otherwise asx. Pt back to chair after walk with family members present in room.   [facility] Dwayne Garrett Mobility Specialist Mobility Specialist Phone: [phone]

## 2020-10-24 NOTE — Progress Notes (Signed)
      [address]       [place] [address]             [phone]      4 Days Post-Op Procedure(s) (LRB): CORONARY ARTERY BYPASS GRAFTING (CABG) TIMES FOUR ON PUMP, USING LEFT INTERNAL MAMMARY ARTERY AND RIGHT ENDOSCOPIC GREATER SAPHENOUS VEIN HARVEST CONDUITS (N/A) TRANSESOPHAGEAL ECHOCARDIOGRAM (TEE) (N/A) Subjective: Feels okay this morning, no complaints  Objective: Vital signs in last 24 hours: Temp:  [98.8 F (37.1 C)-99.6 F (37.6 C)] 99.2 F (37.3 C) (05/06 0747) Pulse Rate:  [72-80] 79 (05/06 0747) Cardiac Rhythm: Normal sinus rhythm (05/05 1900) Resp:  [17-24] 20 (05/06 0747) BP: (131-156)/(73-93) 155/93 (05/06 0747) SpO2:  [93 %-100 %] 97 % (05/06 0747) Weight:  [99 kg] 99 kg (05/06 0615)     Intake/Output from previous day: 05/05 0701 - 05/06 0700 In: 496.4 [P.O.:240; I.V.:256.4] Out: 1540 [Urine:1540] Intake/Output this shift: No intake/output data recorded.  General appearance: alert, cooperative and no distress Heart: regular rate and rhythm, S1, S2 normal, no murmur, click, rub or gallop Lungs: clear to auscultation bilaterally Abdomen: soft, non-tender; bowel sounds normal; no masses,  no organomegaly Extremities: 2+ pitting edema with tenderness Wound: clean and dry  Lab Results: Recent Labs    10/22/20 0022 10/23/20 0221  WBC 16.6* 13.6*  HGB 11.2* 11.2*  HCT 34.5* 34.2*  PLT 156 191   BMET:  Recent Labs    10/22/20 0022 10/23/20 0221  NA 133* 137  K 4.1 3.8  CL 103 103  CO2 26 27  GLUCOSE 179* 170*  BUN 12 12  CREATININE 0.76 0.69  CALCIUM 8.5* 8.6*    PT/INR: No results for input(s): LABPROT, INR in the last 72 hours. ABG    Component Value Date/Time   PHART 7.319 (L) 10/20/2020 1731   HCO3 22.6 10/20/2020 1731   TCO2 24 10/20/2020 1731   ACIDBASEDEF 4.0 (H) 10/20/2020 1731   O2SAT 94.0 10/20/2020 1731   CBG (last 3)  Recent Labs    10/23/20 1715 10/23/20 2120 10/24/20 0611  GLUCAP 199* 240*  171*    Assessment/Plan: S/P Procedure(s) (LRB): CORONARY ARTERY BYPASS GRAFTING (CABG) TIMES FOUR ON PUMP, USING LEFT INTERNAL MAMMARY ARTERY AND RIGHT ENDOSCOPIC GREATER SAPHENOUS VEIN HARVEST CONDUITS (N/A) TRANSESOPHAGEAL ECHOCARDIOGRAM (TEE) (N/A)  -POD4 CABG x 4 after presenting with acute NSTEMI. EF 60-65%.  Progressing well, continue ASA, statin, metoprolol.   -Post-op atrial fibrillation / flutter- back in NSR, Continue PO Amio   -HTN- SBP 150s-180s- Will add cozaar 23m BID which is half his home dose.  -Mild expected acute blood loss anemia- monitor.   -Type 2 DM- Glucose remains difficult to control. He is on SSI, meal coverage, and Levemir. Increased Levemir to 15 units.   -Volume excess-Wt 8Lbs + from pre-op Wt. Continue daily lasix 457m -DVT PPX- continue enoxaparin.   Plan: Discharge later this morning once BP medication are given. Will give him a few days of lasix on d/c   LOS: 8 days    TeElgie Collard/11/2020

## 2020-10-24 NOTE — Care Management (Signed)
10-24-20 1641 Case Manager received a call from the RN regarding a CPAP for this patient. Case Manager spoke with patient and he states he has a CPAP; however, it is broken and his last sleep study was > 7 years ago. Patient did not know if the CPAP was from [place] or [place]. Case Manager called [facility] and the patient did not get a CPAP from [facility]. Case Manager then called [facility] to see if the CPAP came from them and it did not. Patient did not get the CPAP from the [place]. Case Manager advised the patient to call his PCP vs Pulmonologist regarding the CPAP needs to find which company he was set up with for treatment. Patient may need an additional sleep study. No further needs from Case Manager at this time. Bethena Roys, RN,BSN Case Manager

## 2020-10-24 NOTE — Progress Notes (Signed)
CARDIAC REHAB PHASE I   PRE:  Rate/Rhythm: 84 SR  BP:  Supine:   Sitting: 181/88,164/98  Standing:    SaO2: 95%RA  MODE:  Ambulation: 470 ft   POST:  Rate/Rhythm: 101 ST PVCs  BP:  Supine:   Sitting: 205/105, 176/94  Standing:    SaO2: 96%RA 1021-1111 Pt walked 470 ft with hand held asst with steady gait. Stopped once to rest. BP elevated after walk. Lower with second reading. Pt just got his meds. Notified RN of BP. Education completed with pt and wife who voiced understanding. Reviewed sternal precautions and staying in the tube, IS, walking for ex, watching carbs and heart healthy food choices, wound care, and CRP 2.  Referred to [place]. Pt stated he has quit smoking. Tobacco cessation handout given.    Graylon Good, RN BSN  10/24/2020 11:25 AM

## 2020-10-24 NOTE — Discharge Instructions (Signed)
Discharge Instructions:  1. You may shower, please wash incisions daily with soap and water and keep dry.  If you wish to cover wounds with dressing you may do so but please keep clean and change daily.  No tub baths or swimming until incisions have completely healed.  If your incisions become red or develop any drainage please call our office at [phone]  2. No Driving until cleared by Dr. Abran Duke office and you are no longer using narcotic pain medications  3. Monitor your weight daily.. Please use the same scale and weigh at same time... If you gain 3-5 lbs in 48 hours with associated lower extremity swelling, please contact our office at [phone]  4. Fever of 101.5 for at least 24 hours with no source, please contact our office at [phone]  5. Activity- up as tolerated, please walk at least 3 times per day.  Avoid strenuous activity, no lifting, pushing, or pulling with your arms over 8-10 lbs for a minimum of 6 weeks  6. If any questions or concerns arise, please do not hesitate to contact our office at [phone]

## 2020-10-24 NOTE — Progress Notes (Signed)
Acknowledge order to removed chest tube sutures; however upon removing dressing no sutures found.  Pt and family surprised by immanent discharge but making arrangements to prepare.

## 2020-10-31 ENCOUNTER — Ambulatory Visit: Payer: No Typology Code available for payment source | Admitting: Thoracic Surgery (Cardiothoracic Vascular Surgery)

## 2020-11-11 ENCOUNTER — Ambulatory Visit (INDEPENDENT_AMBULATORY_CARE_PROVIDER_SITE_OTHER): Payer: No Typology Code available for payment source | Admitting: Family Medicine

## 2020-11-11 ENCOUNTER — Encounter: Payer: Self-pay | Admitting: Family Medicine

## 2020-11-11 VITALS — BP 176/94 | HR 64 | Ht 69.0 in | Wt 209.8 lb

## 2020-11-11 DIAGNOSIS — E782 Mixed hyperlipidemia: Secondary | ICD-10-CM

## 2020-11-11 DIAGNOSIS — I10 Essential (primary) hypertension: Secondary | ICD-10-CM | POA: Diagnosis not present

## 2020-11-11 DIAGNOSIS — Z951 Presence of aortocoronary bypass graft: Secondary | ICD-10-CM

## 2020-11-11 DIAGNOSIS — I2510 Atherosclerotic heart disease of native coronary artery without angina pectoris: Secondary | ICD-10-CM | POA: Diagnosis not present

## 2020-11-11 DIAGNOSIS — E118 Type 2 diabetes mellitus with unspecified complications: Secondary | ICD-10-CM

## 2020-11-11 MED ORDER — LOSARTAN POTASSIUM 100 MG PO TABS: 100.0000 mg | ORAL_TABLET | Freq: Every day | ORAL | 3 refills | Status: AC

## 2020-11-11 NOTE — Patient Instructions (Addendum)
Medication Instructions:   Increase Losartan to 166m daily.   Stop Amiodarone (Pacerone).   Continue all other medications.    Labwork:  BMET, Mg - orders given today.   Please do in 2 weeks (around 11/25/2020).  Office will contact with results via phone or letter.    Testing/Procedures: none  Follow-Up: As needed.   Any Other Special Instructions Will Be Listed Below (If Applicable). Call the office in 2 weeks with update on blood pressure readings.   If you need a refill on your cardiac medications before your next appointment, please call your pharmacy.

## 2020-11-11 NOTE — Progress Notes (Signed)
Cardiology Office Note  Date: 11/11/2020   ID: Dwayne Garrett, DOB [birth year], MRN [id number]  PCP:  [facility]  Cardiologist:  Rozann Lesches, MD Electrophysiologist:  None   Chief Complaint: Status post CABG x4  History of Present Illness: Dwayne Garrett is a 65 y.o. male with a history of unstable angina, NSTEMI, CAD, status post CABG x4, history of abnormal LFTs.  Essential hypertension, OSA on CPAP, HLD, DM2, liver cirrhosis, bilateral lower extremity edema, Castleman's disease.  Here for hospital follow-up after four-vessel CABG.  With LIMA-LAD, reverse SVG graft to first diagonal, OM1, PDA.  By [name] on 10/20/2020.  Patient had an NSTEMI and presented to [facility] on 10/15/2020.  He was transferred to [facility] on 10/20/2020 and subsequently underwent a left heart cath which showed severe three-vessel disease.  Had a four-vessel CABG on the same day.  History of previous PCI's with stent placement on 2 previous occasions the last of which was 2018.  He is here today for follow-up status post four-vessel bypass.  States he is feeling pretty good considering the circumstances.  He denies any anginal or exertional symptoms.  His median sternotomy incision and chest tube incision and vein harvesting site are healing well.  His blood pressure is elevated at 176/94.  He is currently taking hydrochlorothiazide 25 mg daily, losartan 50 mg daily, metoprolol 25 mg p.o. twice daily.  Crestor 20 mg daily.  Currently taking metformin 1000 mg p.o. twice daily.  His hemoglobin A1c was significantly elevated during hospital stay 12.4%.  He brings with him a log of blood pressures, blood sugars, and weights from home.  Blood pressures are elevated consistently.  Weights are ranging in the low 200s from 205 to 209.  He denies any DOE or SOB.  No significant lower extremity edema.  Denies any palpitations or arrhythmias.  He is currently on amiodarone. He had some postop atrial  flutter with variable AV block with a rate of 130 on Oct 23, 2020.  He was started on amiodarone as a result.  He currently denies any palpitations or arrhythmias.  Heart rate is controlled at a rate of 64 today and regular.   Past Medical History:  Diagnosis Date  . Abnormal LFTs    Cirrhosis per liver biopsy - follows with Dr. Laural Golden  . Bilateral leg edema    Chronic  . 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 Coronary atherosclerosis of native coronary artery    Multivessel (moderate), DES RCA 4/11, LVEF 60-65%. LHC 03/11/17: DES to dist RCA & 3rd left ventricular branch  . Essential hypertension, benign   . Inferior myocardial infarction ([place])    09/2009  . OSA (obstructive sleep apnea)    on CPAP    Past Surgical History:  Procedure Laterality Date  . BONE CYST EXCISION     Cyst removal from neck  . CARDIAC CATHETERIZATION N/A 12/11/2014   Procedure: Left Heart Cath and Coronary Angiography;  Surgeon: Burnell Blanks, MD;  Location: [place];  Service: Cardiovascular;  Laterality: N/A;  . CORONARY ARTERY BYPASS GRAFT N/A 10/20/2020   Procedure: CORONARY ARTERY BYPASS GRAFTING (CABG) TIMES FOUR ON PUMP, USING LEFT INTERNAL MAMMARY ARTERY AND RIGHT ENDOSCOPIC GREATER SAPHENOUS VEIN HARVEST CONDUITS;  Surgeon: Lajuana Matte, MD;  Location: [facility];  Service: Open Heart Surgery;  Laterality: N/A;  . CORONARY STENT INTERVENTION N/A 03/11/2017   Procedure: CORONARY STENT INTERVENTION;  Surgeon: Belva Crome, MD;  Location:  [place] [facility];  Service: Cardiovascular;  Laterality: N/A;  . ESOPHAGOGASTRODUODENOSCOPY (EGD) WITH PROPOFOL N/A 09/06/2019   Procedure: ESOPHAGOGASTRODUODENOSCOPY (EGD) WITH PROPOFOL;  Surgeon: Jonathon Bellows, MD;  Location: [facility];  Service: Gastroenterology;  Laterality: N/A;  . LEFT HEART CATH AND CORONARY ANGIOGRAPHY N/A 03/11/2017   Procedure: LEFT HEART CATH AND CORONARY ANGIOGRAPHY;  Surgeon: Belva Crome, MD;  Location: [place] [facility];   Service: Cardiovascular;  Laterality: N/A;  . LEFT HEART CATH AND CORONARY ANGIOGRAPHY N/A 10/15/2020   Procedure: LEFT HEART CATH AND CORONARY ANGIOGRAPHY;  Surgeon: Wellington Hampshire, MD;  Location: [place] [facility];  Service: Cardiovascular;  Laterality: N/A;  . SKIN GRAFT     Finger  . TEE WITHOUT CARDIOVERSION N/A 10/20/2020   Procedure: TRANSESOPHAGEAL ECHOCARDIOGRAM (TEE);  Surgeon: Lajuana Matte, MD;  Location: [facility];  Service: Open Heart Surgery;  Laterality: N/A;  . TUMOR REMOVAL     Abdominal  . ULTRASOUND GUIDANCE FOR VASCULAR ACCESS  03/11/2017   Procedure: Ultrasound Guidance For Vascular Access;  Surgeon: Belva Crome, MD;  Location: [place] [facility];  Service: Cardiovascular;;    Current Outpatient Medications  Medication Sig Dispense Refill  . aspirin EC 325 MG EC tablet Take 1 tablet (325 mg total) by mouth daily. 30 tablet 0  . docusate sodium (COLACE) 100 MG capsule Take by mouth.    . gabapentin (NEURONTIN) 300 MG capsule Take by mouth.    . hydrochlorothiazide (HYDRODIURIL) 25 MG tablet Take 1 tablet (25 mg total) by mouth daily. 30 tablet 1  . lactulose (CHRONULAC) 10 GM/15ML solution Take 10 g by mouth 2 (two) times daily.    Marland Kitchen losartan (COZAAR) 100 MG tablet Take 1 tablet (100 mg total) by mouth daily. 90 tablet 3  . metFORMIN (GLUCOPHAGE) 1000 MG tablet Take 1,000 mg by mouth 2 (two) times daily with a meal.  3  . metoprolol tartrate (LOPRESSOR) 25 MG tablet Take 1 tablet (25 mg total) by mouth 2 (two) times daily. 60 tablet 1  . Multiple Vitamin (MULTIVITAMIN WITH MINERALS) TABS tablet Take 1 tablet by mouth daily.    . Omega-3 Fatty Acids (FISH OIL) 1000 MG CAPS Take 3,000 mg by mouth daily.    Marland Kitchen omeprazole (PRILOSEC) 40 MG capsule TAKE 1 CAPSULE (40 MG TOTAL) BY MOUTH DAILY. 90 capsule 3  . rosuvastatin (CRESTOR) 20 MG tablet Take 1 tablet (20 mg total) by mouth daily. 30 tablet 1  . sildenafil (VIAGRA) 100 MG tablet Take 100 mg by mouth daily as  needed for erectile dysfunction.     No current facility-administered medications for this visit.   Allergies:  Nyquil multi-symptom [pseudoeph-doxylamine-dm-apap] and Tylenol [acetaminophen]   Social History: The patient  reports that he has been smoking cigarettes. He has been smoking about 0.25 packs per day. His smokeless tobacco use includes chew. He reports that he does not drink alcohol and does not use drugs.   Family History: The patient's family history includes Heart attack (age of onset: 59) in his brother.   ROS:  Please see the history of present illness. Otherwise, complete review of systems is positive for none.  All other systems are reviewed and negative.   Physical Exam: VS:  BP (!) 176/94   Pulse 64   Ht 5' 9"  (1.753 m)   Wt 209 lb 12.8 oz (95.2 kg)   SpO2 98%   BMI 30.98 kg/m , BMI Body mass index is 30.98 kg/m.  Wt Readings from Last 3 Encounters:  11/11/20 209 lb 12.8 oz (95.2 kg)  10/24/20 218 lb 4.8 oz (99 kg)  09/06/19 205 lb (93 kg)    General: Patient appears comfortable at rest. HEENT: Conjunctiva and lids normal, oropharynx clear with moist mucosa. Neck: Supple, no elevated JVP or carotid bruits, no thyromegaly. Lungs: Clear to auscultation, nonlabored breathing at rest. Cardiac: Regular rate and rhythm, no S3 or significant systolic murmur, no pericardial rub. Abdomen: Soft, nontender, no hepatomegaly, bowel sounds present, no guarding or rebound. Extremities: No pitting edema, distal pulses 2+. Skin: Warm and dry. Musculoskeletal: No kyphosis. Neuropsychiatric: Alert and oriented x3, affect grossly appropriate.  ECG:    Recent Labwork: 10/15/2020: ALT 109; AST 98 10/23/2020: BUN 12; Creatinine, Ser 0.69; Hemoglobin 11.2; Magnesium 1.7; Platelets 191; Potassium 3.8; Sodium 137     Component Value Date/Time   CHOL 266 (H) 10/15/2020 1142   TRIG 161 (H) 10/15/2020 1142   HDL 32 (L) 10/15/2020 1142   CHOLHDL 8.3 10/15/2020 1142   VLDL 32  10/15/2020 1142   LDLCALC 202 (H) 10/15/2020 1142    Other Studies Reviewed Today:   10/15/2020 LEFT HEART CATH AND CORONARY ANGIOGRAPHY    Conclusion    Prox Cx lesion is 30% stenosed.  Previously placed Mid RCA to Dist RCA drug eluting stent is widely patent.  Previously placed 3rd RPL drug eluting stent is widely patent.  Prox RCA lesion is 95% stenosed.  RPAV lesion is 99% stenosed.  RPDA lesion is 70% stenosed.  1st RPL lesion is 80% stenosed.  RV Branch lesion is 80% stenosed.  Mid LM lesion is 60% stenosed.  2nd Mrg lesion is 80% stenosed.  Prox LAD to Mid LAD lesion is 60% stenosed.  Previously placed 1st Diag-1 stent (unknown type) is widely patent.  1st Diag-2 lesion is 80% stenosed.   1.  Significant left main and three-vessel coronary artery disease. 2.  Left ventricular angiography was not performed.  Ejection fraction appeared normal by echo.  Mildly elevated left ventricular end-diastolic pressure.  Recommendations: Recommend evaluation for CABG.  Resume heparin 8 hours after sheath pull.  Diagnostic Dominance: Right      Echocardiogram 10/15/2020  1. Left ventricular ejection fraction, by estimation, is 60 to 65%. The left ventricle has normal function. The left ventricle has no regional wall motion abnormalities. There is moderate left ventricular hypertrophy. Left ventricular diastolic parameters are indeterminate. 2. Right ventricular systolic function is normal. The right ventricular size is normal. There is mildly elevated pulmonary artery systolic pressure. The estimated right ventricular systolic pressure is 50.5 mmHg. 3. The mitral valve is normal in structure. Trivial mitral valve regurgitation. 4. The aortic valve is tricuspid. Aortic valve regurgitation is not visualized. No aortic stenosis is present. 5. The inferior vena cava is normal in size with greater than 50% respiratory variability, suggesting right atrial pressure  of 3 mmHg.   Assessment and Plan:  1. S/P CABG x 4   2. CAD in native artery   3. Mixed hyperlipidemia   4. Essential (primary) hypertension   5. Diabetes mellitus type 2 with complications ([place])    1. S/P CABG x 4 Recent NSTEMI with subsequent cardiac catheterization and CABG x4 with LIMA-LAD, reverse SVG graft to first diagonal, OM1, PDA.  By [name] on 10/20/2020.  Patient states he is feeling well and having no issues.  Denies any anginal or exertional symptoms.  Just some minor discomfort around the incision site. Vein harvesting site  looks good, median sternotomy site and chest tube insertion site/drainage sites look good.  He has a follow-up with [name] via virtual visit on Friday, June 3.  Also has a follow-up with his primary cardiologist Dr. Sabra Heck in the near future.  He had some postop atrial flutter and was placed on loading dose of amiodarone.  He denies any palpitations or arrhythmias since that time.  We will discontinue amiodarone.   2. CAD in native artery Recent NSTEMI with subsequent cardiac catheterization was severe three-vessel disease.  See cath report above.  Denies any anginal symptoms currently.  Continue aspirin 325 mg daily.  Continue metoprolol 25 mg p.o. twice daily.  3. Mixed hyperlipidemia Continue Crestor 20 mg daily.  Lipid panel 10/15/2020: TC 266, HDL 32, LDL 202, TG 161, VLDL 32  4. Essential (primary) hypertension Blood pressure is elevated on arrival today at 176/94.  Patient brings with him a log of blood pressures which are elevated at home since being discharged from the hospital.  Increase losartan to 100 mg daily.  Continue HCTZ 25 mg daily.  Start checking blood pressures for the next 2 weeks and call with log of blood pressures after 2 weeks of taking increased dosage of losartan.  Get basic metabolic panel and magnesium in 2 weeks after increasing dose of losartan.  5. Diabetes mellitus type 2 with complications ([place]) He had  significantly elevated hemoglobin A1c 12.4% during hospital stay.  He states his blood sugars have been better since discharge.  He is taking metformin 1000 mg p.o. twice daily currently.  He will be following up soon with [facility] in [place] and his primary cardiologist.    Medication Adjustments/Labs and Tests Ordered: Current medicines are reviewed at length with the patient today.  Concerns regarding medicines are outlined above.   Disposition: Follow-up with Dr. Domenic Polite or APP as needed.  His [facility]  Signed, Levell July, NP 11/11/2020 4:24 PM    [facility] [place], [place], [place] [address] Phone: [phone]; Fax: [phone]

## 2020-11-21 ENCOUNTER — Telehealth (INDEPENDENT_AMBULATORY_CARE_PROVIDER_SITE_OTHER): Payer: Self-pay | Admitting: Thoracic Surgery (Cardiothoracic Vascular Surgery)

## 2020-11-21 ENCOUNTER — Other Ambulatory Visit: Payer: Self-pay

## 2020-11-21 DIAGNOSIS — Z951 Presence of aortocoronary bypass graft: Secondary | ICD-10-CM

## 2020-11-21 NOTE — Progress Notes (Signed)
     [address]       ,[place] [address]             [phone]       Patient: Home Provider: Office Consent for Telemedicine visit obtained.  Today's visit was completed via a real-time telehealth (see specific modality noted below). The patient/authorized person provided oral consent at the time of the visit to engage in a telemedicine encounter with the present provider at [facility]. The patient/authorized person was informed of the potential benefits, limitations, and risks of telemedicine. The patient/authorized person expressed understanding that the laws that protect confidentiality also apply to telemedicine. The patient/authorized person acknowledged understanding that telemedicine does not provide emergency services and that he or she would need to call 911 or proceed to the nearest hospital for help if such a need arose.  . Total time spent in the clinical discussion 10 minutes. . Telehealth Modality: Phone visit (audio only)  I had a telephone visit with Dwayne Garrett.  He states that he is doing well.  His blood sugars have been under fairly good control.  They have made changes to his blood pressure medication due to hypertension.  He is getting up and moving around without much difficulty.  He will follow-up with me in 1 month with a chest x-ray.

## 2020-12-25 ENCOUNTER — Other Ambulatory Visit: Payer: Self-pay | Admitting: Thoracic Surgery (Cardiothoracic Vascular Surgery)

## 2020-12-25 DIAGNOSIS — Z951 Presence of aortocoronary bypass graft: Secondary | ICD-10-CM

## 2020-12-26 ENCOUNTER — Ambulatory Visit (INDEPENDENT_AMBULATORY_CARE_PROVIDER_SITE_OTHER): Payer: Self-pay | Admitting: Thoracic Surgery (Cardiothoracic Vascular Surgery)

## 2020-12-26 ENCOUNTER — Other Ambulatory Visit: Payer: Self-pay

## 2020-12-26 ENCOUNTER — Ambulatory Visit
Admission: RE | Admit: 2020-12-26 | Discharge: 2020-12-26 | Disposition: A | Discharge status: Home / Self Care | Payer: No Typology Code available for payment source | Source: Ambulatory Visit | Attending: Thoracic Surgery (Cardiothoracic Vascular Surgery) | Admitting: Thoracic Surgery (Cardiothoracic Vascular Surgery)

## 2020-12-26 VITALS — BP 170/90 | HR 70 | Resp 20 | Ht 69.0 in | Wt 209.0 lb

## 2020-12-26 DIAGNOSIS — Z951 Presence of aortocoronary bypass graft: Secondary | ICD-10-CM

## 2020-12-26 NOTE — Progress Notes (Signed)
      [address]       [place] [address]             [phone]        Buster D Chauvin [place] Medical Record #[id number] Date of Birth: 10-Oct-1955  Referring: Satira Sark, MD Primary Care: [facility] Primary Cardiologist:Samuel Domenic Polite, MD  Reason for visit:   follow-up  History of Present Illness:     Mr. Saine presents for his 1 month follow-up appointment following CABG.  Overall he is doing quite well.  He did have his sleep study was noted to have severe sleep apnea.  He is awaiting replacement of his CPAP machine.  He has seen his primary care physician and has had changes to his blood pressure medication.  Overall he is doing quite well.  Physical Exam: BP (!) 170/90   Pulse 70   Resp 20   Ht 5' 9"  (1.753 m)   Wt 209 lb (94.8 kg)   SpO2 96% Comment: RA  BMI 30.86 kg/m   Alert NAD Incision clean.  Sternum stable Abdomen soft, ND No peripheral edema   Diagnostic Studies & Laboratory data: CXR: Clear     Assessment / Plan:   65 year old male status post CABG doing well.  He does remain hypertensive.  He had a recent change to his blood pressure medication with increasing his metoprolol and adding amlodipine.  Additionally once he is on his CPAP his blood pressure should also improved.  He is cleared for cardiac rehab. Follow-up as needed.   Lajuana Matte 12/26/2020 4:59 PM

## 2021-08-04 ENCOUNTER — Encounter: Payer: Self-pay | Admitting: *Deleted

## 2021-08-05 ENCOUNTER — Encounter: Payer: Self-pay | Admitting: Cardiology

## 2021-08-05 NOTE — Progress Notes (Signed)
Cardiology Office Note  Date: 08/06/2021   ID: Dwayne Garrett, DOB 1955-06-29, MRN [id number]  PCP:  [facility]  Cardiologist:  Rozann Lesches, MD Electrophysiologist:  None   Chief Complaint  Patient presents with   Cardiac follow-up    History of Present Illness: Dwayne Garrett is a 66 y.o. male last seen in May 2022 by [name].  Interval follow-up noted with [name] status post CABG. he is here for a follow-up visit.  He tells me that he has experienced dyspnea on exertion and fatigue with inclines since November 2022.  He has undergone extensive testing through the [facility] system including what sounds like a Myoview, cardiac MRI, and also cardiac catheterization.  We are trying to obtain these results.  He has maintained cardiology follow-up through the [facility] system and has undergone medication adjustments as noted below.  They are also giving consideration to Ranexa and starting an SGLT2 inhibitor.  He has pending discussion with pharmacist in a few weeks.  He still reports shortness of breath, states that he can walk without difficulty on level ground however.  No chest tightness.  We had him ambulate in the clinic today with pulse oximeter in place, oxygen saturation levels were 97% at rest and 90% after walking for 6 minutes.  He underwent CABG in May 2022 including LIMA to LAD, SVGs to first diagonal, OM1, and PDA.  Based on the hand-drawn picture he presented me today from his cardiac catheterization at the [facility] [place], it looks like his bypass grafts were found to be patent.  Past Medical History:  Diagnosis Date   Abnormal LFTs    Cirrhosis per liver biopsy - follows with Dr. 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[place])    09/2009   OSA on CPAP     Past Surgical History:  Procedure Laterality Date   BONE CYST EXCISION     Cyst removal from neck   CARDIAC CATHETERIZATION N/A 12/11/2014   Procedure: Left Heart Cath and Coronary Angiography;  Surgeon: Burnell Blanks, MD;  Location: [place] CV LAB;  Service: Cardiovascular;  Laterality: N/A;   CORONARY ARTERY BYPASS GRAFT N/A 10/20/2020   Procedure: CORONARY ARTERY BYPASS GRAFTING (CABG) TIMES FOUR ON PUMP, USING LEFT INTERNAL MAMMARY ARTERY AND RIGHT ENDOSCOPIC GREATER SAPHENOUS VEIN HARVEST CONDUITS;  Surgeon: Lajuana Matte, MD;  Location: [place];  Service: Open Heart Surgery;  Laterality: N/A;   CORONARY STENT INTERVENTION N/A 03/11/2017   Procedure: CORONARY STENT INTERVENTION;  Surgeon: Belva Crome, MD;  Location: [place] CV LAB;  Service: Cardiovascular;  Laterality: N/A;   ESOPHAGOGASTRODUODENOSCOPY (EGD) WITH PROPOFOL N/A 09/06/2019   Procedure: ESOPHAGOGASTRODUODENOSCOPY (EGD) WITH PROPOFOL;  Surgeon: Jonathon Bellows, MD;  Location: [facility] ENDOSCOPY;  Service: Gastroenterology;  Laterality: N/A;   LEFT HEART CATH AND CORONARY ANGIOGRAPHY N/A 03/11/2017   Procedure: LEFT HEART CATH AND CORONARY ANGIOGRAPHY;  Surgeon: Belva Crome, MD;  Location: [place] CV LAB;  Service: Cardiovascular;  Laterality: N/A;   LEFT HEART CATH AND CORONARY ANGIOGRAPHY N/A 10/15/2020   Procedure: LEFT HEART CATH AND CORONARY ANGIOGRAPHY;  Surgeon: Wellington Hampshire, MD;  Location: [facility]  INVASIVE CV LAB;  Service: Cardiovascular;  Laterality: N/A;   SKIN GRAFT     Finger   TEE WITHOUT CARDIOVERSION N/A 10/20/2020   Procedure: TRANSESOPHAGEAL ECHOCARDIOGRAM (TEE);  Surgeon: Lajuana Matte, MD;  Location: [place];  Service: Open Heart Surgery;  Laterality: N/A;   TUMOR REMOVAL     Abdominal   ULTRASOUND GUIDANCE FOR VASCULAR ACCESS  03/11/2017   Procedure: Ultrasound Guidance For Vascular Access;  Surgeon:  Belva Crome, MD;  Location: [place] CV LAB;  Service: Cardiovascular;;    Current Outpatient Medications  Medication Sig Dispense Refill   amLODipine (NORVASC) 10 MG tablet Take 1 tablet (10 mg total) by mouth daily. 90 tablet 3   aspirin EC 81 MG tablet Take 1 tablet (81 mg total) by mouth daily. Swallow whole. 90 tablet 3   ezetimibe (ZETIA) 10 MG tablet Take 10 mg by mouth daily.     gabapentin (NEURONTIN) 300 MG capsule Take by mouth.     insulin glargine (LANTUS) 100 UNIT/ML injection Inject 10 Units into the skin at bedtime.     isosorbide mononitrate (IMDUR) 60 MG 24 hr tablet Take 60 mg by mouth daily.     lactulose (CHRONULAC) 10 GM/15ML solution Take 10 g by mouth daily as needed.     losartan (COZAAR) 100 MG tablet Take 1 tablet (100 mg total) by mouth daily. 90 tablet 3   metFORMIN (GLUCOPHAGE) 1000 MG tablet Take 1,000 mg by mouth 2 (two) times daily with a meal.  3   metoprolol tartrate (LOPRESSOR) 100 MG tablet Take 100 mg by mouth 2 (two) times daily.     Multiple Vitamin (MULTIVITAMIN WITH MINERALS) TABS tablet Take 1 tablet by mouth daily.     nitroGLYCERIN (NITROSTAT) 0.4 MG SL tablet Place 0.4 mg under the tongue every 5 (five) minutes as needed for chest pain.     Omega-3 Fatty Acids (FISH OIL) 1000 MG CAPS Take 3,000 mg by mouth daily.     omeprazole (PRILOSEC) 40 MG capsule TAKE 1 CAPSULE (40 MG TOTAL) BY MOUTH DAILY. 90 capsule 3   rosuvastatin (CRESTOR) 20 MG tablet Take 1 tablet (20 mg total) by mouth daily. 30 tablet 1   hydrochlorothiazide (HYDRODIURIL) 25 MG tablet Take 1 tablet (25 mg total) by mouth daily. (Patient not taking: Reported on 08/06/2021) 30 tablet 1   No current facility-administered medications for this visit.   Allergies:  Nyquil multi-symptom [pseudoeph-doxylamine-dm-apap] and Tylenol [acetaminophen]   ROS: No palpitations or syncope.  Frequent sweating.  Physical Exam: VS:  BP (!) 168/98 (BP Location: Right Arm)    Pulse 71 Comment:  after walking 6 minutes   Ht 5' 10"  (1.778 m)    Wt 229 lb 9.6 oz (104.1 kg)    SpO2 91% Comment: after walking 6 minutes   BMI 32.94 kg/m , BMI Body mass index is 32.94 kg/m.  Wt Readings from Last 3 Encounters:  08/06/21 229 lb 9.6 oz (104.1 kg)  12/26/20 209 lb (94.8 kg)  11/11/20 209 lb 12.8 oz (95.2 kg)    General: Patient appears comfortable at rest. HEENT: Conjunctiva and lids normal, wearing a mask. Neck: Supple, no elevated JVP or carotid bruits, no thyromegaly. Lungs: Clear to auscultation, nonlabored breathing at rest. Thorax: Well-healed sternal incision. Cardiac: Regular rate and rhythm, no S3 or significant systolic murmur, no pericardial rub. Abdomen: Soft, nontender, bowel sounds present. Extremities: No pitting edema.  ECG:  An ECG dated 10/23/2020 was personally reviewed today and  demonstrated:  Sinus rhythm with PACs, nonspecific ST-T changes.  Recent Labwork: 10/15/2020: ALT 109; AST 98 10/23/2020: BUN 12; Creatinine, Ser 0.69; Hemoglobin 11.2; Magnesium 1.7; Platelets 191; Potassium 3.8; Sodium 137     Component Value Date/Time   CHOL 266 (H) 10/15/2020 1142   TRIG 161 (H) 10/15/2020 1142   HDL 32 (L) 10/15/2020 1142   CHOLHDL 8.3 10/15/2020 1142   VLDL 32 10/15/2020 1142   LDLCALC 202 (H) 10/15/2020 1142    Other Studies Reviewed Today:  Echocardiogram 10/15/2020:  1. Left ventricular ejection fraction, by estimation, is 60 to 65%. The  left ventricle has normal function. The left ventricle has no regional  wall motion abnormalities. There is moderate left ventricular hypertrophy.  Left ventricular diastolic  parameters are indeterminate.   2. Right ventricular systolic function is normal. The right ventricular  size is normal. There is mildly elevated pulmonary artery systolic  pressure. The estimated right ventricular systolic pressure is 59.5 mmHg.   3. The mitral valve is normal in structure. Trivial mitral valve  regurgitation.   4. The aortic valve  is tricuspid. Aortic valve regurgitation is not  visualized. No aortic stenosis is present.   5. The inferior vena cava is normal in size with greater than 50%  respiratory variability, suggesting right atrial pressure of 3 mmHg.   Cardiac catheterization 10/15/2020: Prox Cx lesion is 30% stenosed. Previously placed Mid RCA to Dist RCA drug eluting stent is widely patent. Previously placed 3rd RPL drug eluting stent is widely patent. Prox RCA lesion is 95% stenosed. RPAV lesion is 99% stenosed. RPDA lesion is 70% stenosed. 1st RPL lesion is 80% stenosed. RV Branch lesion is 80% stenosed. Mid LM lesion is 60% stenosed. 2nd Mrg lesion is 80% stenosed. Prox LAD to Mid LAD lesion is 60% stenosed. Previously placed 1st Diag-1 stent (unknown type) is widely patent. 1st Diag-2 lesion is 80% stenosed.   1.  Significant left main and three-vessel coronary artery disease. 2.  Left ventricular angiography was not performed.  Ejection fraction appeared normal by echo.  Mildly elevated left ventricular end-diastolic pressure.   Recommendations: Recommend evaluation for CABG.  Resume heparin 8 hours after sheath pull.  Assessment and Plan:  1.  Dyspnea on exertion as discussed above.  He did not have any substantial ambulatory hypoxemia today on evaluation.  We plan to obtain the results of his cardiac testing done through the [facility] system (cardiac MRI and cardiac catheterization).  If in fact his bypass grafts were patent, would agree with attempts to further optimize medical therapy, he should probably also have an echocardiogram to assess pulmonary pressures.  He is currently on aspirin which can be reduced to 81 mg daily, Norvasc which will be increased to 10 mg daily, Imdur, losartan, Lopressor, and Crestor.  Would hold off on Ranexa for now.  Reasonable to add SGLT2 inhibitor but may need to start with the 10 mg daily dose and have concurrent reduction and Glucophage and/or insulin dose.  He  plans to review this with the [facility] pharmacist.  2.  Mixed hyperlipidemia, LDL was 202 in April of last year.  He reports compliance with Crestor and Zetia, states that lipids have been reassessed through the [facility] system, requesting results.  3.  Essential hypertension, not optimally controlled but trend better per review of home blood pressure checks.  Increasing Norvasc to 10 mg daily.  Medication Adjustments/Labs and Tests Ordered: Current medicines are reviewed at length with the patient  today.  Concerns regarding medicines are outlined above.   Tests Ordered: No orders of the defined types were placed in this encounter.   Medication Changes: Meds ordered this encounter  Medications   aspirin EC 81 MG tablet    Sig: Take 1 tablet (81 mg total) by mouth daily. Swallow whole.    Dispense:  90 tablet    Refill:  3    08/06/2021 dose decrease   amLODipine (NORVASC) 10 MG tablet    Sig: Take 1 tablet (10 mg total) by mouth daily.    Dispense:  90 tablet    Refill:  3    08/06/2021 dose increase    Disposition:  Follow up  6 weeks.  Signed, Satira Sark, MD, [facility] 08/06/2021 11:45 AM    [facility] [place], [place], [place] [address] Phone: [phone]; Fax: [phone]

## 2021-08-06 ENCOUNTER — Ambulatory Visit (INDEPENDENT_AMBULATORY_CARE_PROVIDER_SITE_OTHER): Payer: No Typology Code available for payment source | Admitting: Cardiology

## 2021-08-06 ENCOUNTER — Encounter: Payer: Self-pay | Admitting: Cardiology

## 2021-08-06 VITALS — BP 168/98 | HR 71 | Ht 70.0 in | Wt 229.6 lb

## 2021-08-06 DIAGNOSIS — I10 Essential (primary) hypertension: Secondary | ICD-10-CM | POA: Diagnosis not present

## 2021-08-06 DIAGNOSIS — I25119 Atherosclerotic heart disease of native coronary artery with unspecified angina pectoris: Secondary | ICD-10-CM

## 2021-08-06 DIAGNOSIS — E782 Mixed hyperlipidemia: Secondary | ICD-10-CM | POA: Diagnosis not present

## 2021-08-06 DIAGNOSIS — R0609 Other forms of dyspnea: Secondary | ICD-10-CM

## 2021-08-06 MED ORDER — ASPIRIN EC 81 MG PO TBEC: 81.0000 mg | DELAYED_RELEASE_TABLET | Freq: Every day | ORAL | 3 refills | Status: AC

## 2021-08-06 MED ORDER — AMLODIPINE BESYLATE 10 MG PO TABS: 10.0000 mg | ORAL_TABLET | Freq: Every day | ORAL | 3 refills | Status: AC

## 2021-08-06 NOTE — Patient Instructions (Addendum)
Medication Instructions:  Your physician has recommended you make the following change in your medication:  Decrease aspirin to 81 mg daily Increase amlodipine to 10 mg daily Continue other medications the same  Labwork: none  Testing/Procedures: none  Follow-Up: Your physician recommends that you schedule a follow-up appointment in: 6 weeks  Any Other Special Instructions Will Be Listed Below (If Applicable).  If you need a refill on your cardiac medications before your next appointment, please call your pharmacy.

## 2021-09-09 NOTE — Progress Notes (Signed)
0  ? ?Cardiology Office Note   ? ?Date:  09/10/2021  ? ?ID:  AMONTE BROOKOVER, DOB 1955/11/08, MRN [id number] ? ?PCP:  [facility]  ?Cardiologist: Rozann Lesches, MD   ? ?Chief Complaint  ?Patient presents with  ? Follow-up  ?  6 week visit  ? ? ?History of Present Illness:   ? ?Dwayne Garrett is a 67 y.o. male with past medical hisotry of CAD (s/p prior stents with cath in  09/2020 showing LM and 3-vessel CAD --> s/p CABG with LIMA-LAD, reverse SVG-D1-D1, SVG-OM and SVG-PDA), HTN, HLD, Castleman's Disease and OSA who presents to the office today for 6-week follow-up. ? ?He was last examined by Dr. Domenic Polite in 07/2021 and was being followed by the [facility] and had recently undergone additional testing including a Myoview, cardiac MRI and catheterization with full results not available for review at that time. He reported still having dyspnea on exertion but denied any chest pain. His Amlodipine was increased to 10 mg daily and he was continued on ASA, Imdur, Losartan, Lopressor and Crestor. ? ?In talking with the patient today, he reports his dyspnea has improved since his last visit. He is back walking at his home and has been chopping wood. Still has some shortness of breath when walking up steep hills but this has improved and he denies any associated chest pain. He has not had to utilize SL NTG. No recent palpitations, orthopnea, PND or pitting edema. He has been using his CPAP at night. He is being followed by a Pharmacist at the [place] and they have gradually been adjusting his BP medications.  ? ?Past Medical History:  ?Diagnosis Date  ? Abnormal LFTs   ? Cirrhosis per liver biopsy - follows with Dr. Laural Golden  ? Bilateral leg edema   ? Chronic  ? 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 Coronary atherosclerosis of native coronary artery   ? Multivessel (moderate), DES RCA 4/11, LVEF 60-65%. LHC 03/11/17: DES to dist RCA & 3rd left ventricular branch; CABG May 2022  ? Essential hypertension   ? Inferior myocardial  infarction [facility])   ? 09/2009  ? OSA on CPAP   ? ? ?Past Surgical History:  ?Procedure Laterality Date  ? BONE CYST EXCISION    ? Cyst removal from neck  ? CARDIAC CATHETERIZATION N/A 12/11/2014  ? Procedure: Left Heart Cath and Coronary Angiography;  Surgeon: Burnell Blanks, MD;  Location: [place] CV LAB;  Service: Cardiovascular;  Laterality: N/A;  ? CORONARY ARTERY BYPASS GRAFT N/A 10/20/2020  ? Procedure: CORONARY ARTERY BYPASS GRAFTING (CABG) TIMES FOUR ON PUMP, USING LEFT INTERNAL MAMMARY ARTERY AND RIGHT ENDOSCOPIC GREATER SAPHENOUS VEIN HARVEST CONDUITS;  Surgeon: Lajuana Matte, MD;  Location: [place];  Service: Open Heart Surgery;  Laterality: N/A;  ? CORONARY STENT INTERVENTION N/A 03/11/2017  ? Procedure: CORONARY STENT INTERVENTION;  Surgeon: Belva Crome, MD;  Location: [place] CV LAB;  Service: Cardiovascular;  Laterality: N/A;  ? ESOPHAGOGASTRODUODENOSCOPY (EGD) WITH PROPOFOL N/A 09/06/2019  ? Procedure: ESOPHAGOGASTRODUODENOSCOPY (EGD) WITH PROPOFOL;  Surgeon: Jonathon Bellows, MD;  Location: [facility] ENDOSCOPY;  Service: Gastroenterology;  Laterality: N/A;  ? LEFT HEART CATH AND CORONARY ANGIOGRAPHY N/A 03/11/2017  ? Procedure: LEFT HEART CATH AND CORONARY ANGIOGRAPHY;  Surgeon: Belva Crome, MD;  Location: [place] CV LAB;  Service: Cardiovascular;  Laterality: N/A;  ? LEFT HEART CATH AND CORONARY ANGIOGRAPHY N/A 10/15/2020  ? Procedure: LEFT HEART CATH AND CORONARY ANGIOGRAPHY;  Surgeon: Kathlyn Sacramento  A, MD;  Location: [place] CV LAB;  Service: Cardiovascular;  Laterality: N/A;  ? SKIN GRAFT    ? Finger  ? TEE WITHOUT CARDIOVERSION N/A 10/20/2020  ? Procedure: TRANSESOPHAGEAL ECHOCARDIOGRAM (TEE);  Surgeon: Lajuana Matte, MD;  Location: [place];  Service: Open Heart Surgery;  Laterality: N/A;  ? TUMOR REMOVAL    ? Abdominal  ? ULTRASOUND GUIDANCE FOR VASCULAR ACCESS  03/11/2017  ? Procedure: Ultrasound Guidance For Vascular Access;  Surgeon: Belva Crome, MD;  Location: [place] CV LAB;  Service: Cardiovascular;;  ? ? ?Current Medications: ?Outpatient Medications Prior to Visit  ?Medication Sig Dispense Refill  ? amLODipine (NORVASC) 10 MG tablet Take 1 tablet (10 mg total) by mouth daily. 90 tablet 3  ? aspirin EC 81 MG tablet Take 1 tablet (81 mg total) by mouth daily. Swallow whole. 90 tablet 3  ? empagliflozin (JARDIANCE) 25 MG TABS tablet Take 25 mg by mouth daily.    ? ezetimibe (ZETIA) 10 MG tablet Take 10 mg by mouth daily.    ? gabapentin (NEURONTIN) 300 MG capsule Take by mouth.    ? insulin glargine (LANTUS) 100 UNIT/ML injection Inject 10 Units into the skin at bedtime.    ? isosorbide mononitrate (IMDUR) 60 MG 24 hr tablet Take 60 mg by mouth daily.    ? lactulose (CHRONULAC) 10 GM/15ML solution Take 10 g by mouth daily as needed.    ? losartan (COZAAR) 100 MG tablet Take 1 tablet (100 mg total) by mouth daily. 90 tablet 3  ? metFORMIN (GLUCOPHAGE) 1000 MG tablet Take 1,000 mg by mouth 2 (two) times daily with a meal.  3  ? metoprolol tartrate (LOPRESSOR) 100 MG tablet Take 100 mg by mouth 2 (two) times daily.    ? Multiple Vitamin (MULTIVITAMIN WITH MINERALS) TABS tablet Take 1 tablet by mouth daily.    ? nitroGLYCERIN (NITROSTAT) 0.4 MG SL tablet Place 0.4 mg under the tongue every 5 (five) minutes as needed for chest pain.    ? Omega-3 Fatty Acids (FISH OIL) 1000 MG CAPS Take 3,000 mg by mouth daily.    ? omeprazole (PRILOSEC) 40 MG capsule TAKE 1 CAPSULE (40 MG TOTAL) BY MOUTH DAILY. 90 capsule 3  ? rosuvastatin (CRESTOR) 40 MG tablet Take 40 mg by mouth daily.    ? hydrochlorothiazide (HYDRODIURIL) 25 MG tablet Take 1 tablet (25 mg total) by mouth daily. (Patient not taking: Reported on 08/06/2021) 30 tablet 1  ? rosuvastatin (CRESTOR) 20 MG tablet Take 1 tablet (20 mg total) by mouth daily. 30 tablet 1  ? ?No facility-administered medications prior to visit.  ?  ? ?Allergies:   Nyquil multi-symptom [pseudoeph-doxylamine-dm-apap] and Tylenol [acetaminophen]   ? ?Social History  ? ?Socioeconomic History  ? Marital status: Married  ?  Spouse name: Not on file  ? Number of children: Not on file  ? Years of education: Not on file  ? Highest education level: Not on file  ?Occupational History  ? Occupation: Furniture conservator/restorer  ?  Employer: [employer]  ?Tobacco Use  ? Smoking status: Former  ?  Packs/day: 0.25  ?  Types: Cigarettes  ?  Quit date: 09/19/2020  ?  Years since quitting: 0.9  ? Smokeless tobacco: Current  ?  Types: Chew  ? Tobacco comments:  ?  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 Vaping Use: Former  ?Substance and Sexual Activity  ? Alcohol use: No  ?  Drug use: No  ? Sexual activity: Not on file  ?Other Topics Concern  ? Not on file  ?Social History Narrative  ? Not on file  ? ?Social Determinants of Health  ? ?Financial Resource Strain: Not on file  ?Food Insecurity: Not on file  ?Transportation Needs: Not on file  ?Physical Activity: Not on file  ?Stress: Not on file  ?Social Connections: Not on file  ?  ? ?Family History:  The patient's family history includes Heart attack (age of onset: 50) in his brother.  ? ?Review of Systems:   ? ?Please see the history of present illness.    ? ?All other systems reviewed and are otherwise negative except as noted above. ? ? ?Physical Exam:   ? ?VS:  BP (!) 162/82   Pulse 70   Ht 5' 9"  (1.753 m)   Wt 229 lb (103.9 kg)   SpO2 97%   BMI 33.82 kg/m?    ?General: Well developed, well nourished,male appearing in no acute distress. ?Head: Normocephalic, atraumatic. ?Neck: No carotid bruits. JVD not elevated.  ?Lungs: Respirations regular and unlabored, without wheezes or rales.  ?Heart: Regular rate and rhythm. No S3 or S4.  No murmur, no rubs, or gallops appreciated. ?Abdomen: Appears non-distended. No obvious abdominal masses. ?Msk:  Strength and tone appear normal for age. No obvious joint deformities or effusions. ?Extremities: No clubbing or cyanosis. No pitting edema.  Distal pedal pulses are 2+  bilaterally. ?Neuro: Alert and oriented X 3. Moves all extremities spontaneously. No focal deficits noted. ?Psych:  Responds to questions appropriately with a normal affect. ?Skin: No rashes or lesions noted ? ?Wt Readings from Last 3 [place]

## 2021-09-10 ENCOUNTER — Other Ambulatory Visit: Payer: Self-pay

## 2021-09-10 ENCOUNTER — Ambulatory Visit (INDEPENDENT_AMBULATORY_CARE_PROVIDER_SITE_OTHER): Payer: No Typology Code available for payment source | Admitting: Student

## 2021-09-10 ENCOUNTER — Encounter: Payer: Self-pay | Admitting: Student

## 2021-09-10 VITALS — BP 162/82 | HR 70 | Ht 69.0 in | Wt 229.0 lb

## 2021-09-10 DIAGNOSIS — I25118 Atherosclerotic heart disease of native coronary artery with other forms of angina pectoris: Secondary | ICD-10-CM

## 2021-09-10 DIAGNOSIS — E785 Hyperlipidemia, unspecified: Secondary | ICD-10-CM

## 2021-09-10 DIAGNOSIS — E118 Type 2 diabetes mellitus with unspecified complications: Secondary | ICD-10-CM | POA: Diagnosis not present

## 2021-09-10 DIAGNOSIS — I10 Essential (primary) hypertension: Secondary | ICD-10-CM | POA: Diagnosis not present

## 2021-09-10 NOTE — Patient Instructions (Signed)
Would recommend reviewing with your Pharmacist at the [facility] in regards to switching from HCTZ to Chlorthalidone for better blood pressure control.  ? ?Medication Instructions:  ?Your physician recommends that you continue on your current medications as directed. Please refer to the Current Medication list given to you today. ? ?*If you need a refill on your cardiac medications before your next appointment, please call your pharmacy* ? ? ?Lab Work: ?NONE  ? ?If you have labs (blood work) drawn today and your tests are completely normal, you will receive your results only by: ?MyChart Message (if you have MyChart) OR ?A paper copy in the mail ?If you have any lab test that is abnormal or we need to change your treatment, we will call you to review the results. ? ? ?Testing/Procedures: ?NONE  ? ? ?Follow-Up: ?At [facility], you and your health needs are our priority.  As part of our continuing mission to provide you with exceptional heart care, we have created designated Provider Care Teams.  These Care Teams include your primary Cardiologist (physician) and Advanced Practice Providers (APPs -  Physician Assistants and Nurse Practitioners) who all work together to provide you with the care you need, when you need it. ? ?We recommend signing up for the patient portal called "MyChart".  Sign up information is provided on this After Visit Summary.  MyChart is used to connect with patients for Virtual Visits (Telemedicine).  Patients are able to view lab/test results, encounter notes, upcoming appointments, etc.  Non-urgent messages can be sent to your provider as well.   ?To learn more about what you can do with MyChart, go to [email or web address].   ? ?Your next appointment:   ?6 month(s) ? ?The format for your next appointment:   ?In Person ? ?Provider:   ?Rozann Lesches, MD  ? ? ?Other Instructions ?Thank you for choosing [place]! ? ? ? ?

## 2021-09-18 ENCOUNTER — Ambulatory Visit: Payer: No Typology Code available for payment source | Admitting: Student

## 2022-03-11 ENCOUNTER — Encounter: Payer: Self-pay | Admitting: Cardiology

## 2022-03-11 ENCOUNTER — Encounter: Payer: Self-pay | Admitting: *Deleted

## 2022-03-11 ENCOUNTER — Ambulatory Visit: Payer: No Typology Code available for payment source | Attending: Cardiology | Admitting: Cardiology

## 2022-03-11 VITALS — BP 132/80 | HR 79 | Ht 69.0 in | Wt 220.8 lb

## 2022-03-11 DIAGNOSIS — E782 Mixed hyperlipidemia: Secondary | ICD-10-CM

## 2022-03-11 DIAGNOSIS — I25119 Atherosclerotic heart disease of native coronary artery with unspecified angina pectoris: Secondary | ICD-10-CM

## 2022-03-11 DIAGNOSIS — I10 Essential (primary) hypertension: Secondary | ICD-10-CM

## 2022-03-11 NOTE — Progress Notes (Signed)
Cardiology Office Note  Date: 03/11/2022   ID: Dwayne Garrett, DOB Oct 20, 1955, MRN [id number]  PCP:  [facility]  Cardiologist:  Rozann Lesches, MD Electrophysiologist:  None   Chief Complaint  Patient presents with   Cardiac follow-up    History of Present Illness: Dwayne Garrett is a 66 y.o. male last seen in March by Ms. Strader PA-C, I reviewed the note.  He is here for a routine visit.  States that he feels good, improved stamina and much less short of breath with typical activities.  Feels like he has finally recuperated from CABG last year.  I reviewed his medications which are noted below.  He is tolerating current regimen.  Requesting most recent lipid panel from [place] [facility] to make sure he is getting toward goal.  Blood pressure is better controlled today.  I personally reviewed his ECG which shows sinus rhythm with rightward axis, nonspecific ST-T changes.  Past Medical History:  Diagnosis Date   Abnormal LFTs    Cirrhosis per liver biopsy - follows with Dr. 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[place])    09/2009   OSA on CPAP     Past Surgical History:  Procedure Laterality Date   BONE CYST EXCISION     Cyst removal from neck   CARDIAC CATHETERIZATION N/A 12/11/2014   Procedure: Left Heart Cath and Coronary Angiography;  Surgeon: Burnell Blanks, MD;  Location: [place] [facility];  Service: Cardiovascular;  Laterality: N/A;   CORONARY ARTERY BYPASS GRAFT N/A 10/20/2020   Procedure: CORONARY ARTERY BYPASS GRAFTING (CABG) TIMES FOUR ON PUMP, USING LEFT INTERNAL MAMMARY ARTERY AND RIGHT ENDOSCOPIC GREATER SAPHENOUS VEIN HARVEST CONDUITS;  Surgeon: Lajuana Matte, MD;   Location: [place];  Service: Open Heart Surgery;  Laterality: N/A;   CORONARY STENT INTERVENTION N/A 03/11/2017   Procedure: CORONARY STENT INTERVENTION;  Surgeon: Belva Crome, MD;  Location: [place] [facility];  Service: Cardiovascular;  Laterality: N/A;   ESOPHAGOGASTRODUODENOSCOPY (EGD) WITH PROPOFOL N/A 09/06/2019   Procedure: ESOPHAGOGASTRODUODENOSCOPY (EGD) WITH PROPOFOL;  Surgeon: Jonathon Bellows, MD;  Location[facility];  Service: Gastroenterology;  Laterality: N/A;   LEFT HEART CATH AND CORONARY ANGIOGRAPHY N/A 03/11/2017   Procedure: LEFT HEART CATH AND CORONARY ANGIOGRAPHY;  Surgeon: Belva Crome, MD;  Location: [place] [facility];  Service: Cardiovascular;  Laterality: N/A;   LEFT HEART CATH AND CORONARY ANGIOGRAPHY N/A 10/15/2020   Procedure: LEFT HEART CATH AND CORONARY ANGIOGRAPHY;  Surgeon: Wellington Hampshire, MD;  Location: [place] [facility];  Service: Cardiovascular;  Laterality: N/A;   SKIN GRAFT     Finger   TEE WITHOUT CARDIOVERSION N/A 10/20/2020   Procedure: TRANSESOPHAGEAL ECHOCARDIOGRAM (TEE);  Surgeon: Lajuana Matte, MD;  Location: [place];  Service: Open Heart Surgery;  Laterality: N/A;   TUMOR REMOVAL     Abdominal   ULTRASOUND GUIDANCE FOR VASCULAR ACCESS  03/11/2017   Procedure: Ultrasound Guidance For Vascular Access;  Surgeon: Belva Crome, MD;  Location: [place] [facility];  Service: Cardiovascular;;    Current Outpatient Medications  Medication Sig Dispense Refill   amLODipine (NORVASC) 10 MG tablet Take 1 tablet (10  mg total) by mouth daily. 90 tablet 3   aspirin EC 81 MG tablet Take 1 tablet (81 mg total) by mouth daily. Swallow whole. 90 tablet 3   chlorthalidone (HYGROTEN) 15 MG tablet Take 15 mg by mouth daily.     empagliflozin (JARDIANCE) 25 MG TABS tablet Take 25 mg by mouth daily.     ezetimibe (ZETIA) 10 MG tablet Take 10 mg by mouth daily.     insulin glargine (LANTUS) 100 UNIT/ML injection Inject 10 Units into the skin at bedtime.      isosorbide mononitrate (IMDUR) 60 MG 24 hr tablet Take 60 mg by mouth daily.     lactulose (CHRONULAC) 10 GM/15ML solution Take 10 g by mouth daily as needed.     losartan (COZAAR) 100 MG tablet Take 1 tablet (100 mg total) by mouth daily. 90 tablet 3   metFORMIN (GLUCOPHAGE) 1000 MG tablet Take 1,000 mg by mouth 2 (two) times daily with a meal.  3   metoprolol tartrate (LOPRESSOR) 100 MG tablet Take 100 mg by mouth 2 (two) times daily.     Multiple Vitamin (MULTIVITAMIN WITH MINERALS) TABS tablet Take 1 tablet by mouth daily.     nitroGLYCERIN (NITROSTAT) 0.4 MG SL tablet Place 0.4 mg under the tongue every 5 (five) minutes as needed for chest pain.     Omega-3 Fatty Acids (FISH OIL) 1000 MG CAPS Take 3,000 mg by mouth daily.     omeprazole (PRILOSEC) 40 MG capsule TAKE 1 CAPSULE (40 MG TOTAL) BY MOUTH DAILY. 90 capsule 3   rosuvastatin (CRESTOR) 40 MG tablet Take 40 mg by mouth daily.     No current facility-administered medications for this visit.   Allergies:  Nyquil multi-symptom [pseudoeph-doxylamine-dm-apap] and Tylenol [acetaminophen]   ROS:  No syncope.  Physical Exam: VS:  BP 132/80 (BP Location: Right Arm, Patient Position: Sitting, Cuff Size: Normal)   Pulse 79   Ht 5' 9"  (1.753 m)   Wt 220 lb 12.8 oz (100.2 kg)   SpO2 95%   BMI 32.61 kg/m , BMI Body mass index is 32.61 kg/m.  Wt Readings from Last 3 Encounters:  03/11/22 220 lb 12.8 oz (100.2 kg)  09/10/21 229 lb (103.9 kg)  08/06/21 229 lb 9.6 oz (104.1 kg)    General: Patient appears comfortable at rest. HEENT: Conjunctiva and lids normal. Neck: Supple, no elevated JVP or carotid bruits. Lungs: Clear to auscultation, nonlabored breathing at rest. Cardiac: Regular rate and rhythm, no S3, 1/6 systolic murmur. Extremities: No pitting edema.  ECG:  An ECG dated 10/23/2020 was personally reviewed today and demonstrated:  Sinus rhythm with PACs, nonspecific ST-T changes.  Recent Labwork:    Component Value Date/Time    CHOL 266 (H) 10/15/2020 1142   TRIG 161 (H) 10/15/2020 1142   HDL 32 (L) 10/15/2020 1142   CHOLHDL 8.3 10/15/2020 1142   VLDL 32 10/15/2020 1142   LDLCALC 202 (H) 10/15/2020 1142  November 2022: Hemoglobin 14.8, platelets 225, potassium 3.7, BUN 13, creatinine 0.68, AST 49, ALT 70  Other Studies Reviewed Today:  Echocardiogram 10/15/2020:  1. Left ventricular ejection fraction, by estimation, is 60 to 65%. The  left ventricle has normal function. The left ventricle has no regional  wall motion abnormalities. There is moderate left ventricular hypertrophy.  Left ventricular diastolic  parameters are indeterminate.   2. Right ventricular systolic function is normal. The right ventricular  size is normal. There is mildly elevated pulmonary artery systolic  pressure. The estimated  right ventricular systolic pressure is 37.1 mmHg.   3. The mitral valve is normal in structure. Trivial mitral valve  regurgitation.   4. The aortic valve is tricuspid. Aortic valve regurgitation is not  visualized. No aortic stenosis is present.   5. The inferior vena cava is normal in size with greater than 50%  respiratory variability, suggesting right atrial pressure of 3 mmHg.    Cardiac catheterization 10/15/2020: Prox Cx lesion is 30% stenosed. Previously placed Mid RCA to Dist RCA drug eluting stent is widely patent. Previously placed 3rd RPL drug eluting stent is widely patent. Prox RCA lesion is 95% stenosed. RPAV lesion is 99% stenosed. RPDA lesion is 70% stenosed. 1st RPL lesion is 80% stenosed. RV Branch lesion is 80% stenosed. Mid LM lesion is 60% stenosed. 2nd Mrg lesion is 80% stenosed. Prox LAD to Mid LAD lesion is 60% stenosed. Previously placed 1st Diag-1 stent (unknown type) is widely patent. 1st Diag-2 lesion is 80% stenosed.   1.  Significant left main and three-vessel coronary artery disease. 2.  Left ventricular angiography was not performed.  Ejection fraction appeared normal  by echo.  Mildly elevated left ventricular end-diastolic pressure.   Recommendations: Recommend evaluation for CABG.  Resume heparin 8 hours after sheath pull.  Assessment and Plan:  1.  Multivessel CAD status post CABG in May 2022.  Stamina and breathlessness have improved significantly since last encounter and he is tolerating current medications well.  ECG reviewed.  Continue aspirin, Norvasc, Jardiance, Imdur, Cozaar, Lopressor, Crestor, and Zetia.  2.  Mixed hyperlipidemia, on Crestor and Zetia.  Requesting interval lab work from [facility] system.  His LDL was 202 in April of last year.  3.  Essential hypertension, blood pressure trend better, 132/80 today.  No change in current regimen.  Medication Adjustments/Labs and Tests Ordered: Current medicines are reviewed at length with the patient today.  Concerns regarding medicines are outlined above.   Tests Ordered: Orders Placed This Encounter  Procedures   EKG 12-Lead    Medication Changes: No orders of the defined types were placed in this encounter.   Disposition:  Follow up  6 months.  Signed, Satira Sark, MD, [facility] 03/11/2022 4:20 PM    [facility] [place], [place], [place] [address] Phone: [phone]; Fax: [phone]

## 2022-03-11 NOTE — Patient Instructions (Addendum)

## 2022-11-04 IMAGING — DX DG CHEST 1V PORT
1 series · 1 of 1 positions shown · non-contrast
Comparison: 10/21/2020.

CLINICAL DATA: Shortness of breath.  Pleural effusion.

EXAM:
PORTABLE CHEST 1 VIEW

[chest]
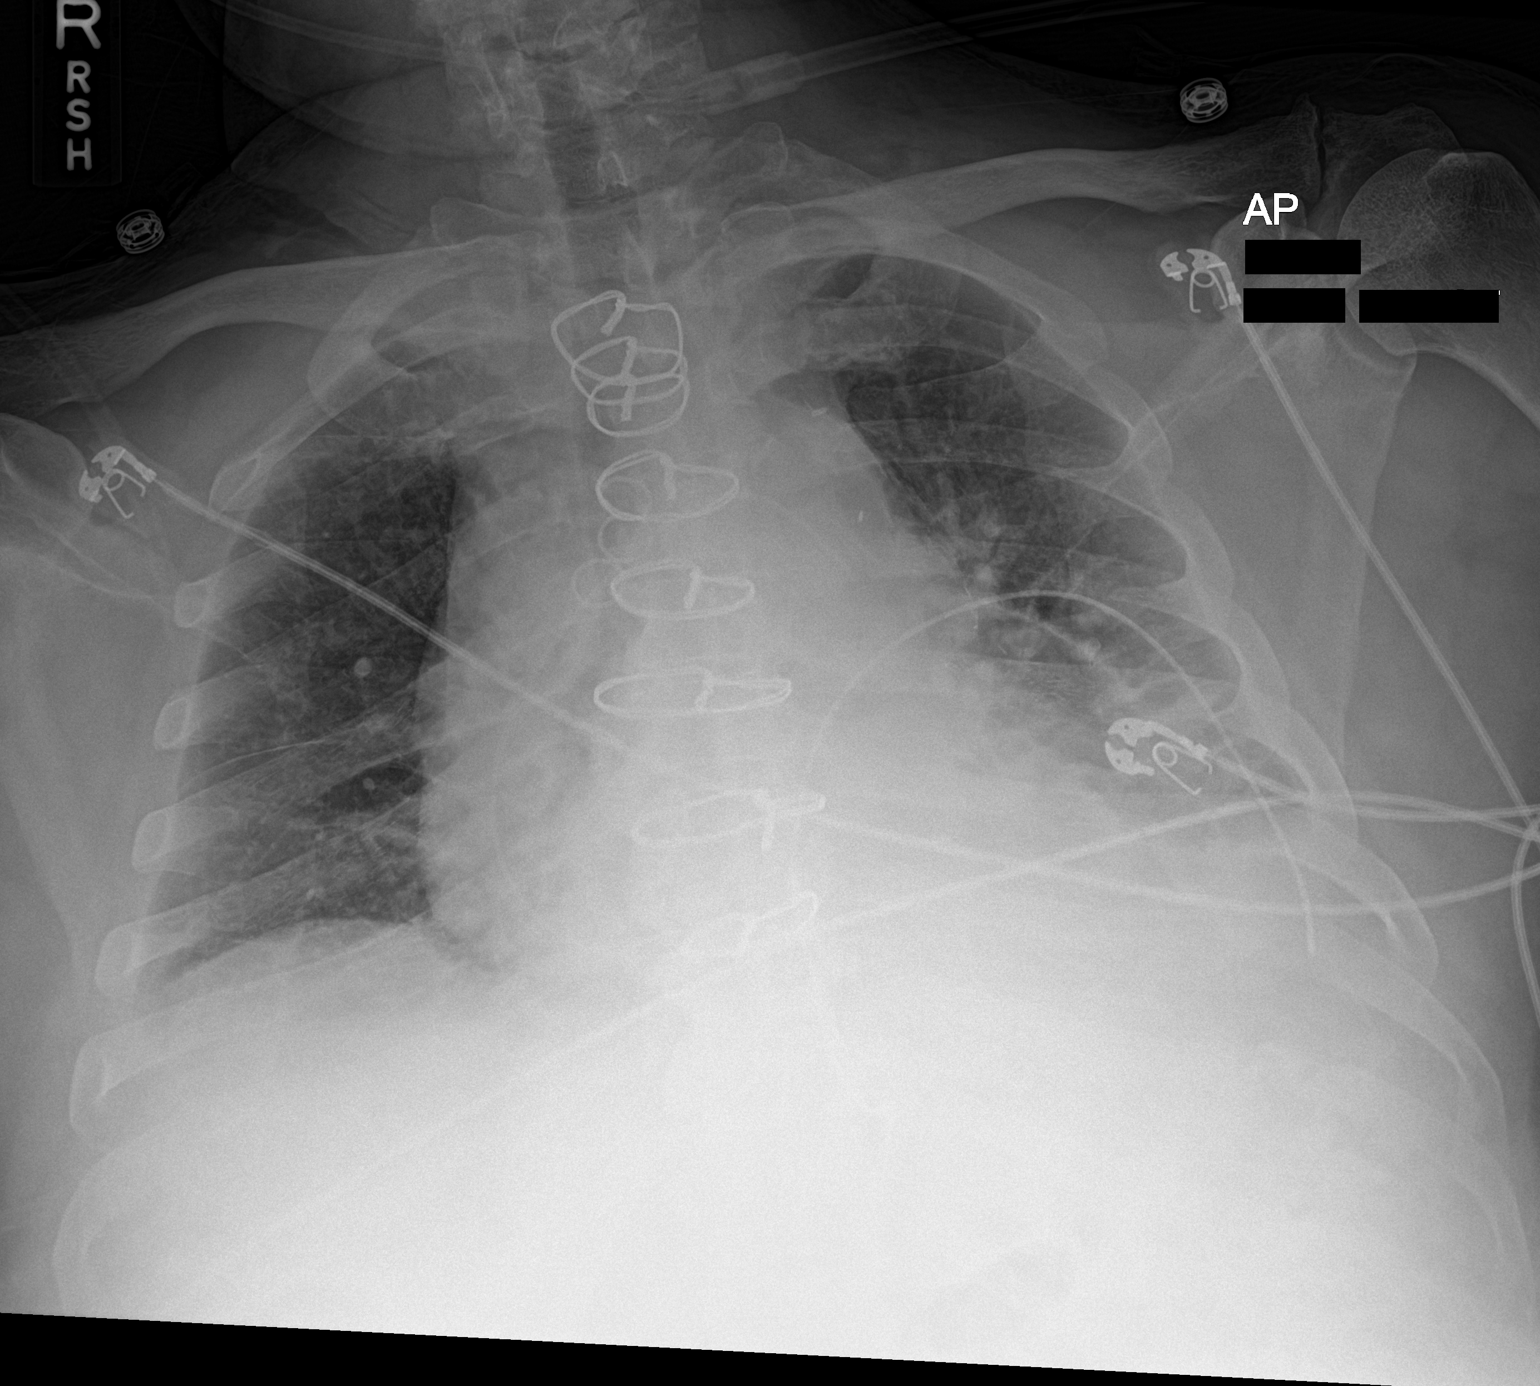

[1 of 1 positions shown; findings below may reference images not displayed]

FINDINGS: Interim removal of right IJ line. Mediastinal drainage catheter and
left chest tube in stable position. Prior CABG. Cardiomegaly. Mild
bilateral interstitial prominence again noted. Persistent left base
atelectasis/infiltrate. Tiny bilateral pleural effusions again
noted. No pneumothorax.
IMPRESSION: 1. Interim removal of right IJ line. Mediastinal drainage catheter
left chest tube in stable position. No pneumothorax.

2. Prior CABG. Cardiomegaly. Mild bilateral interstitial prominence
again noted. Persistent left base atelectasis/infiltrate. Tiny
bilateral pleural effusions again noted.
